# Patient Record
Sex: Male | Born: 1985 | Race: Black or African American | Hispanic: No | Marital: Single
Health system: Midwestern US, Community
[De-identification: ages and names within clinical notes are randomized; demographics above are authoritative.]

## PROBLEM LIST (undated history)

## (undated) DIAGNOSIS — G40919 Epilepsy, unspecified, intractable, without status epilepticus: Principal | ICD-10-CM

## (undated) DIAGNOSIS — G40909 Epilepsy, unspecified, not intractable, without status epilepticus: Secondary | ICD-10-CM

## (undated) DIAGNOSIS — R1013 Epigastric pain: Principal | ICD-10-CM

## (undated) DIAGNOSIS — R1902 Left upper quadrant abdominal swelling, mass and lump: Secondary | ICD-10-CM

## (undated) DIAGNOSIS — Z5900 Homelessness unspecified: Secondary | ICD-10-CM

## (undated) DIAGNOSIS — N200 Calculus of kidney: Secondary | ICD-10-CM

## (undated) DIAGNOSIS — R569 Unspecified convulsions: Secondary | ICD-10-CM

## (undated) DIAGNOSIS — J45909 Unspecified asthma, uncomplicated: Secondary | ICD-10-CM

## (undated) DIAGNOSIS — E559 Vitamin D deficiency, unspecified: Secondary | ICD-10-CM

## (undated) DIAGNOSIS — G40409 Other generalized epilepsy and epileptic syndromes, not intractable, without status epilepticus: Secondary | ICD-10-CM

## (undated) DIAGNOSIS — F419 Anxiety disorder, unspecified: Secondary | ICD-10-CM

## (undated) DIAGNOSIS — S92901A Unspecified fracture of right foot, initial encounter for closed fracture: Secondary | ICD-10-CM

## (undated) DIAGNOSIS — J342 Deviated nasal septum: Secondary | ICD-10-CM

## (undated) HISTORY — DX: Unspecified fracture of right foot, initial encounter for closed fracture: S92.901A

## (undated) HISTORY — DX: Deviated nasal septum: J34.2

## (undated) HISTORY — DX: Anxiety disorder, unspecified: F41.9

---

## 2012-08-23 ENCOUNTER — Inpatient Hospital Stay: Payer: Self-pay | Admitting: Internal Medicine

## 2012-08-23 LAB — COMPREHENSIVE METABOLIC PANEL
Alkaline Phosphatase: 52 U/L (ref 50–136)
Anion Gap: 3 — ABNORMAL LOW (ref 7–16)
BUN: 8 mg/dL (ref 7–18)
Calcium, Total: 8.9 mg/dL (ref 8.5–10.1)
Chloride: 105 mmol/L (ref 98–107)
EGFR (Non-African Amer.): >60
Glucose: 76 mg/dL (ref 65–99)
Osmolality: 275 (ref 275–301)
Potassium: 3.7 mmol/L (ref 3.5–5.1)
SGOT(AST): 23 U/L (ref 15–37)
SGPT (ALT): 20 U/L (ref 12–78)
Sodium: 139 mmol/L (ref 136–145)

## 2012-08-23 LAB — ETHANOL
Ethanol %: <0.003 % (ref 0.000–0.080)
Ethanol: <3 mg/dL

## 2012-08-23 LAB — URINALYSIS, COMPLETE
Bilirubin,UR: NEGATIVE
Glucose,UR: NEGATIVE mg/dL (ref 0–75)
Leukocyte Esterase: NEGATIVE
Nitrite: NEGATIVE
Protein: NEGATIVE
RBC,UR: 1 /HPF (ref 0–5)
Specific Gravity: 1.008 (ref 1.003–1.030)

## 2012-08-23 LAB — DRUG SCREEN, URINE
Amphetamines, Ur Screen: NEGATIVE (ref ?–1000)
Barbiturates, Ur Screen: NEGATIVE (ref ?–200)
Benzodiazepine, Ur Scrn: NEGATIVE (ref ?–200)
Cannabinoid 50 Ng, Ur ~~LOC~~: NEGATIVE (ref ?–50)
Cocaine Metabolite,Ur ~~LOC~~: NEGATIVE (ref ?–300)
MDMA (Ecstasy)Ur Screen: NEGATIVE (ref ?–500)
Opiate, Ur Screen: NEGATIVE (ref ?–300)
Phencyclidine (PCP) Ur S: NEGATIVE (ref ?–25)

## 2012-08-23 LAB — CBC
HCT: 44 % (ref 40.0–52.0)
MCH: 31 pg (ref 26.0–34.0)
MCHC: 34.7 g/dL (ref 32.0–36.0)
Platelet: 196 10*3/uL (ref 150–440)
WBC: 11.3 10*3/uL — ABNORMAL HIGH (ref 3.8–10.6)

## 2012-08-23 LAB — ACETAMINOPHEN LEVEL: Acetaminophen: <2 ug/mL

## 2012-08-23 LAB — SALICYLATE LEVEL: Salicylates, Serum: <1.7 mg/dL

## 2012-08-24 DIAGNOSIS — Z01810 Encounter for preprocedural cardiovascular examination: Secondary | ICD-10-CM

## 2012-08-24 LAB — CBC WITH DIFFERENTIAL/PLATELET
Basophil #: 0 10*3/uL (ref 0.0–0.1)
Basophil %: 0.3 %
Eosinophil %: 1.1 %
HCT: 37.4 % — ABNORMAL LOW (ref 40.0–52.0)
Lymphocyte %: 15.4 %
MCH: 31.5 pg (ref 26.0–34.0)
MCHC: 35.7 g/dL (ref 32.0–36.0)
MCV: 88 fL (ref 80–100)
Monocyte #: 0.8 x10 3/mm (ref 0.2–1.0)
Platelet: 169 10*3/uL (ref 150–440)
RDW: 12.8 % (ref 11.5–14.5)
WBC: 8.6 10*3/uL (ref 3.8–10.6)

## 2012-08-24 LAB — COMPREHENSIVE METABOLIC PANEL
Albumin: 3.2 g/dL — ABNORMAL LOW (ref 3.4–5.0)
Alkaline Phosphatase: 47 U/L — ABNORMAL LOW (ref 50–136)
Anion Gap: 4 — ABNORMAL LOW (ref 7–16)
BUN: 5 mg/dL — ABNORMAL LOW (ref 7–18)
Calcium, Total: 7.7 mg/dL — ABNORMAL LOW (ref 8.5–10.1)
Chloride: 113 mmol/L — ABNORMAL HIGH (ref 98–107)
Co2: 30 mmol/L (ref 21–32)
Creatinine: 0.97 mg/dL (ref 0.60–1.30)
EGFR (Non-African Amer.): >60
Glucose: 89 mg/dL (ref 65–99)
Osmolality: 289 (ref 275–301)
SGOT(AST): 15 U/L (ref 15–37)
SGPT (ALT): 16 U/L (ref 12–78)
Sodium: 147 mmol/L — ABNORMAL HIGH (ref 136–145)

## 2012-08-24 LAB — PROTIME-INR: Prothrombin Time: 14.4 secs (ref 11.5–14.7)

## 2012-08-25 LAB — BASIC METABOLIC PANEL
BUN: 4 mg/dL — ABNORMAL LOW (ref 7–18)
Calcium, Total: 8.5 mg/dL (ref 8.5–10.1)
Chloride: 108 mmol/L — ABNORMAL HIGH (ref 98–107)
Co2: 27 mmol/L (ref 21–32)
Creatinine: 0.7 mg/dL (ref 0.60–1.30)
EGFR (African American): >60
EGFR (Non-African Amer.): >60
Osmolality: 277 (ref 275–301)
Sodium: 140 mmol/L (ref 136–145)

## 2012-08-25 LAB — MAGNESIUM
Magnesium: 1.7 mg/dL — ABNORMAL LOW
Magnesium: 2.4 mg/dL

## 2012-09-02 ENCOUNTER — Ambulatory Visit: Payer: Self-pay | Admitting: Family Medicine

## 2013-08-15 ENCOUNTER — Encounter (HOSPITAL_COMMUNITY): Payer: Self-pay | Admitting: Emergency Medicine

## 2013-08-15 ENCOUNTER — Emergency Department (HOSPITAL_COMMUNITY)
Admission: EM | Admit: 2013-08-15 | Discharge: 2013-08-16 | Disposition: A | Discharge status: Home / Self Care | Payer: Medicaid Other | Attending: Emergency Medicine | Admitting: Emergency Medicine

## 2013-08-15 DIAGNOSIS — M25569 Pain in unspecified knee: Secondary | ICD-10-CM | POA: Insufficient documentation

## 2013-08-15 DIAGNOSIS — M545 Low back pain, unspecified: Secondary | ICD-10-CM

## 2013-08-15 DIAGNOSIS — M5430 Sciatica, unspecified side: Secondary | ICD-10-CM | POA: Insufficient documentation

## 2013-08-15 DIAGNOSIS — M79671 Pain in right foot: Secondary | ICD-10-CM

## 2013-08-15 DIAGNOSIS — M25561 Pain in right knee: Secondary | ICD-10-CM

## 2013-08-15 DIAGNOSIS — J45909 Unspecified asthma, uncomplicated: Secondary | ICD-10-CM | POA: Insufficient documentation

## 2013-08-15 DIAGNOSIS — M25579 Pain in unspecified ankle and joints of unspecified foot: Secondary | ICD-10-CM | POA: Insufficient documentation

## 2013-08-15 HISTORY — DX: Unspecified convulsions: R56.9

## 2013-08-15 HISTORY — DX: Unspecified asthma, uncomplicated: J45.909

## 2013-08-15 MED ORDER — NAPROXEN 500 MG PO TABS: 500.0000 mg | ORAL_TABLET | Freq: Two times a day (BID) | ORAL | Status: DC

## 2013-08-15 NOTE — ED Provider Notes (Signed)
CSN: [id number]     Arrival date & time 08/15/13  2157 History  This chart was scribed for non-physician practitioner working with Justin OctaveStephen Rancour, MD by Justin Logan, ED Scribe. This patient was seen in room TR09C/TR09C and the patient's care was started at 11:31 PM.   Chief Complaint  Patient presents with  . Back Pain     The history is provided by the patient. No language interpreter was used.   HPI Comments: Justin Logan is a 28 y.o. male brought in by EMS, who presents to the Emergency Department complaining of right sided back pain that radiates to the right knee and plantar surface of the right foot, ongoing for 15 minutes prior to arrival to ED. Patient reports that the pain in his knee and foot presented first with his back pain following. Pain exacerbated with ambulation and movement. Patient denies numbness and tingling in his lower extremities and bowel/bladder changes or incontinence. Patient denies recent injury or trauma. Patient denies history of similar pain. Patient denies history of IV drug use or cancer.  Past Medical History  Diagnosis Date  . Seizures   . Asthma    History reviewed. No pertinent past surgical history. No family history on file. History  Substance Use Topics  . Smoking status: Never Smoker   . Smokeless tobacco: Not on file  . Alcohol Use: No    Review of Systems  Constitutional: Negative for fever and chills.  Genitourinary: Negative for dysuria and frequency.  Musculoskeletal: Positive for arthralgias, back pain and myalgias.  Neurological: Negative for weakness and numbness.      Allergies  Review of patient's allergies indicates no known allergies.  Home Medications   Prior to Admission medications   Not on File   Triage Vitals: BP 134/69  Pulse 89  Temp(Src) 98.6 F (37 C) (Oral)  Resp 18  Ht 6\' 6"  (1.981 m)  Wt 240 lb 9 oz (109.118 kg)  BMI 27.81 kg/m2  SpO2 98% Physical Exam  Nursing note and vitals  reviewed. Constitutional: He is oriented to person, place, and time. He appears well-developed and well-nourished. No distress.  HENT:  Head: Normocephalic and atraumatic.  Eyes: EOM are normal.  Neck: Neck supple.  Cardiovascular: Normal rate.   Pulmonary/Chest: Effort normal. No respiratory distress.  Musculoskeletal: Normal range of motion. He exhibits tenderness. He exhibits no edema.  Right leg: No erythema, edema or warmth. 2+ DP pulse of right foot. No erythema, warmth or edema or right foot. Small callus on bottom of right foot.  2+ patellar reflexes bilaterally. Muscle strength 5/5 in lower extremities bilaterally.  Neurological: He is alert and oriented to person, place, and time.  Skin: Skin is warm and dry.  Psychiatric: He has a normal mood and affect. His behavior is normal.    ED Course  Procedures (including critical care time) COORDINATION OF CARE: 11:31 PM- Will prescribe anti inflammatory. Discussed treatment plan with patient at bedside and patient agreed to plan.   Labs Review Labs Reviewed - No data to display  Imaging Review No results found.   EKG Interpretation None      MDM   Final diagnoses:  None   Patient with back pain.  No neurological deficits and normal neuro exam.  Patient can walk but states is painful.  No loss of bowel or bladder control.  No concern for cauda equina.  No fever, night sweats, weight loss, h/o cancer, IVDU.  RICE protocol and pain medicine indicated and  discussed with patient.  Patient stable for discharge.  Return precautions given.   I personally performed the services described in this documentation, which was scribed in my presence. The recorded information has been reviewed and is accurate.    Justin GladHeather Paden Kuras, PA-C 08/18/13 0121

## 2013-08-15 NOTE — ED Notes (Signed)
Per EMS: Pt reports right sided back pain that radiates to right leg x 15 minutes. Pt ambulatory to triage.Denies numbness/tingling. Denies urinary symptoms/bowel incontinence. Pt in NAD.

## 2013-08-16 ENCOUNTER — Emergency Department (HOSPITAL_COMMUNITY): Payer: Medicaid Other

## 2013-08-16 ENCOUNTER — Encounter (HOSPITAL_COMMUNITY): Payer: Self-pay | Admitting: Emergency Medicine

## 2013-08-16 ENCOUNTER — Emergency Department (HOSPITAL_COMMUNITY)
Admission: EM | Admit: 2013-08-16 | Discharge: 2013-08-16 | Disposition: A | Discharge status: Home / Self Care | Payer: Medicaid Other | Attending: Emergency Medicine | Admitting: Emergency Medicine

## 2013-08-16 DIAGNOSIS — R0789 Other chest pain: Secondary | ICD-10-CM

## 2013-08-16 DIAGNOSIS — R071 Chest pain on breathing: Secondary | ICD-10-CM

## 2013-08-16 DIAGNOSIS — M25559 Pain in unspecified hip: Secondary | ICD-10-CM | POA: Insufficient documentation

## 2013-08-16 DIAGNOSIS — M25569 Pain in unspecified knee: Secondary | ICD-10-CM | POA: Insufficient documentation

## 2013-08-16 DIAGNOSIS — R079 Chest pain, unspecified: Secondary | ICD-10-CM | POA: Insufficient documentation

## 2013-08-16 DIAGNOSIS — Z791 Long term (current) use of non-steroidal anti-inflammatories (NSAID): Secondary | ICD-10-CM | POA: Insufficient documentation

## 2013-08-16 DIAGNOSIS — M25552 Pain in left hip: Secondary | ICD-10-CM

## 2013-08-16 DIAGNOSIS — Z8669 Personal history of other diseases of the nervous system and sense organs: Secondary | ICD-10-CM | POA: Insufficient documentation

## 2013-08-16 DIAGNOSIS — M25562 Pain in left knee: Secondary | ICD-10-CM

## 2013-08-16 DIAGNOSIS — J45909 Unspecified asthma, uncomplicated: Secondary | ICD-10-CM | POA: Insufficient documentation

## 2013-08-16 MED ORDER — NAPROXEN 500 MG PO TABS
500.0000 mg | ORAL_TABLET | Freq: Once | ORAL | Status: AC
  Administered 2013-08-16: 500 mg via ORAL
  Filled 2013-08-16: qty 1

## 2013-08-16 NOTE — ED Notes (Signed)
Pt ambulating independently w/ steady gait on d/c in no acute distress, A&Ox4. D/c instructions reviewed w/ pt - pt denies any further questions or concerns at present. Rx given x1  

## 2013-08-16 NOTE — ED Notes (Signed)
Patient is alert and oriented x3.  He is complaining of lower right abdominal pain that started about  An hour ago.  [facility].  He [facility] [place] the road when he states the pain started.  Currently he rates his pain 10 of 10.

## 2013-08-16 NOTE — ED Notes (Signed)
Patient is alert and oriented x3.  He was given DC instructions and follow up visit instructions.  Patient gave verbal understanding.  He was DC ambulatory under his own power to home.  V/S stable.  He was not showing any signs of distress on DC 

## 2013-08-16 NOTE — Discharge Instructions (Signed)
Fill the prescription for Naproxen give to you yesterday. Follow up with a primary care provider.  Arthralgia Your caregiver has diagnosed you as suffering from an arthralgia. Arthralgia means there is pain in a joint. This can come from many reasons including:  Bruising the joint which causes soreness (inflammation) in the joint.  Wear and tear on the joints which occur as we grow older (osteoarthritis).  Overusing the joint.  Various forms of arthritis.  Infections of the joint. Regardless of the cause of pain in your joint, most of these different pains respond to anti-inflammatory drugs and rest. The exception to this is when a joint is infected, and these cases are treated with antibiotics, if it is a bacterial infection. HOME CARE INSTRUCTIONS   Rest the injured area for as long as directed by your caregiver. Then slowly start using the joint as directed by your caregiver and as the pain allows. Crutches as directed may be useful if the ankles, knees or hips are involved. If the knee was splinted or casted, continue use and care as directed. If an stretchy or elastic wrapping bandage has been applied today, it should be removed and re-applied every 3 to 4 hours. It should not be applied tightly, but firmly enough to keep swelling down. Watch toes and feet for swelling, bluish discoloration, coldness, numbness or excessive pain. If any of these problems (symptoms) occur, remove the ace bandage and re-apply more loosely. If these symptoms persist, contact your caregiver or return to this location.  For the first 24 hours, keep the injured extremity elevated on pillows while lying down.  Apply ice for 15-20 minutes to the sore joint every couple hours while awake for the first half day. Then 03-04 times per day for the first 48 hours. Put the ice in a plastic bag and place a towel between the bag of ice and your skin.  Wear any splinting, casting, elastic bandage applications, or slings as  instructed.  Only take over-the-counter or prescription medicines for pain, discomfort, or fever as directed by your caregiver. Do not use aspirin immediately after the injury unless instructed by your physician. Aspirin can cause increased bleeding and bruising of the tissues.  If you were given crutches, continue to use them as instructed and do not resume weight bearing on the sore joint until instructed. Persistent pain and inability to use the sore joint as directed for more than 2 to 3 days are warning signs indicating that you should see a caregiver for a follow-up visit as soon as possible. Initially, a hairline fracture (break in bone) may not be evident on X-rays. Persistent pain and swelling indicate that further evaluation, non-weight bearing or use of the joint (use of crutches or slings as instructed), or further X-rays are indicated. X-rays may sometimes not show a small fracture until a week or 10 days later. Make a follow-up appointment with your own caregiver or one to whom we have referred you. A radiologist (specialist in reading X-rays) may read your X-rays. Make sure you know how you are to obtain your X-ray results. Do not assume everything is normal if you do not hear from us. SEEK MEDICAL CARE IF: Bruising, swelling, or pain increases. SEEK IMMEDIATE MEDICAL CARE IF:   Your fingers or toes are numb or blue.  The pain is not responding to medications and continues to stay the same or get worse.  The pain in your joint becomes severe.  You develop a fever over 102  F (38.9 C).  It becomes impossible to move or use the joint. MAKE SURE YOU:   Understand these instructions.  Will watch your condition.  Will get help right away if you are not doing well or get worse. Document Released: 02/12/2005 Document Revised: 05/07/2011 Document Reviewed: 10/01/2007 [facility] Patient Information 2015 [place], [place]. This information is not intended to replace advice given to you  by your health care provider. Make sure you discuss any questions you have with your health care provider.  RICE: Routine Care for Injuries The routine care of many injuries includes Rest, Ice, Compression, and Elevation (RICE). HOME CARE INSTRUCTIONS  Rest is needed to allow your body to heal. Routine activities can usually be resumed when comfortable. Injured tendons and bones can take up to 6 weeks to heal. Tendons are the cord-like structures that attach muscle to bone.  Ice following an injury helps keep the swelling down and reduces pain.  Put ice in a plastic bag.  Place a towel between your skin and the bag.  Leave the ice on for 15-20 minutes, 3-4 times a day, or as directed by your health care provider. Do this while awake, for the first 24 to 48 hours. After that, continue as directed by your caregiver.  Compression helps keep swelling down. It also gives support and helps with discomfort. If an elastic bandage has been applied, it should be removed and reapplied every 3 to 4 hours. It should not be applied tightly, but firmly enough to keep swelling down. Watch fingers or toes for swelling, bluish discoloration, coldness, numbness, or excessive pain. If any of these problems occur, remove the bandage and reapply loosely. Contact your caregiver if these problems continue.  Elevation helps reduce swelling and decreases pain. With extremities, such as the arms, hands, legs, and feet, the injured area should be placed near or above the level of the heart, if possible. SEEK IMMEDIATE MEDICAL CARE IF:  You have persistent pain and swelling.  You develop redness, numbness, or unexpected weakness.  Your symptoms are getting worse rather than improving after several days. These symptoms may indicate that further evaluation or further X-rays are needed. Sometimes, X-rays may not show a small broken bone (fracture) until 1 week or 10 days later. Make a follow-up appointment with your  caregiver. Ask when your X-ray results will be ready. Make sure you get your X-ray results. Document Released: 05/27/2000 Document Revised: 02/17/2013 Document Reviewed: 07/14/2010 [facility] Patient Information 2015 [place], [place]. This information is not intended to replace advice given to you by your health care provider. Make sure you discuss any questions you have with your health care provider.

## 2013-08-16 NOTE — ED Provider Notes (Signed)
CSN: [id number]     Arrival date & time 08/16/13  2031 History  This chart was scribed for non-physician practitioner, Antony MaduraKelly Humes, PA-C working with Dagmar HaitWilliam Blair Walden, MD by Greggory StallionKayla Andersen, ED scribe. This patient was seen in room WTR6/WTR6 and the patient's care was started at 8:54 PM.   Chief Complaint  Patient presents with  . Abdominal Pain   The history is provided by the patient. No language interpreter was used.   HPI Comments: Justin Logan is a 28 y.o. male who presents to the Emergency Department complaining of gradual onset chest pain that radiates into his right hip and knee that started about one hour ago. Denies trauma or injury to back or hip. [facility]. Denies current back pain. Bearing weight worsens the pain in R hip and knee. Patient denies fever, cough, hemoptysis, current SOB, emesis, dysuria, bowel or bladder incontinence. Denies history of cancer or IV drug use. Pt smokes black and mild cigars on occasion. Did not smoke today. Pt had a kidney stone removed 2-3 years ago but denies other past abdominal surgeries. Pt makes no mention of abdominal pain as noted in triage note.   Past Medical History  Diagnosis Date  . Seizures   . Asthma    History reviewed. No pertinent past surgical history. History reviewed. No pertinent family history. History  Substance Use Topics  . Smoking status: Never Smoker   . Smokeless tobacco: Not on file  . Alcohol Use: No    Review of Systems  Constitutional: Negative for fever.  Respiratory: Negative for cough and shortness of breath.   Cardiovascular: Positive for chest pain.  Gastrointestinal: Negative for vomiting.  Genitourinary:       Negative for bowel or bladder incontinence.  Musculoskeletal: Positive for arthralgias. Negative for back pain.  All other systems reviewed and are negative.   Allergies  Review of patient's allergies indicates no known allergies.  Home  Medications   Prior to Admission medications   Medication Sig Start Date End Date Taking? Authorizing Provider  naproxen (NAPROSYN) 500 MG tablet Take 1 tablet (500 mg total) by mouth 2 (two) times daily. 08/15/13   Heather Laisure, PA-C   BP 123/55  Pulse 83  Temp(Src) 98.5 F (36.9 C) (Oral)  Resp 18  SpO2 100%  Physical Exam  Nursing note and vitals reviewed. Constitutional: He is oriented to person, place, and time. He appears well-developed and well-nourished. No distress.  HENT:  Head: Normocephalic and atraumatic.  Eyes: Conjunctivae and EOM are normal. No scleral icterus.  Neck: Normal range of motion.  Cardiovascular: Normal rate, regular rhythm and normal heart sounds.   Pulmonary/Chest: Effort normal and breath sounds normal. No respiratory distress. He has no wheezes. He has no rales. He exhibits no tenderness.  TTP of chest wall, right of center.  Abdominal: Soft. Normal appearance. He exhibits no distension and no mass. There is tenderness in the right lower quadrant. There is no rebound, no guarding and no tenderness at McBurney's point.    Mild in RLQ without rebound or guarding. No peritoneal signs. No TTP at McBurney's point.  Musculoskeletal: Normal range of motion.       Right hip: He exhibits tenderness and bony tenderness. He exhibits normal range of motion, normal strength, no swelling, no crepitus and no deformity.       Right upper leg: Normal.  +TTP of R hip without bony deformity or crepitus.  Neurological: He  is alert and oriented to person, place, and time.  GCS 15. Speech is cold oriented. Patient moves extremities without ataxia. He ambulates with mild antalgic gait secondary to endorsed right hip pain.  Skin: Skin is warm and dry. No rash noted. He is not diaphoretic. No erythema. No pallor.  Psychiatric: He has a normal mood and affect. His behavior is normal.    ED Course  Procedures (including critical care time)  DIAGNOSTIC STUDIES: Oxygen  Saturation is 100% on RA, normal by my interpretation.    COORDINATION OF CARE: 8:59 PM-Discussed treatment plan which includes speaking with Dr. Gwendolyn GrantWalden with pt at bedside and pt agreed to plan.   Labs Review Labs Reviewed - No data to display  Imaging Review Dg Hip Complete Right  08/16/2013   CLINICAL DATA:  Sudden onset of chest pain radiating down to the right hip and knee.  EXAM: RIGHT HIP - COMPLETE 2+ VIEW  COMPARISON:  None.  FINDINGS: There is no evidence of hip fracture or dislocation. There is no evidence of arthropathy or other focal bone abnormality.  IMPRESSION: Negative.   Electronically Signed   By: Burman NievesWilliam  Stevens M.D.   On: 08/16/2013 22:14     EKG Interpretation None      MDM   Final diagnoses:  Left hip pain  Left knee pain  Costochondral chest pain    28 year old male presents to the emergency department today for variety of complaints. Patient complaining of chest pain which radiated to his right hip and anterior right knee. Patient denies any trauma or injury. Patient made reference to right abdominal pain in triage, but made no mention of abdominal pain upon my presentation. Physical exam significant for tenderness to palpation in the right lower quadrant and bony tenderness to the right hip. No peritoneal signs. Patient neurovascularly intact with no sensory deficits. He is weightbearing without assistance and ambulates with mild antalgic gait secondary to endorsed hip pain.  Imaging of hip obtained which is negative for fracture or bony deformity. Patient treated in ED with approximate with improvement in his hip and knee pain as well as his chest pain. Abdominal reexamination also improved. No fever, nausea, or vomiting to suggest appendicitis. My suspicion for this is very low given reassuring exam. Suspect MSK related pain. Will d/c with [facility] and to fill Rx for Naproxen given yesterday. Patient states he is hungry; requests sandwich  which was provided. Return precautions discussed and patient agreeable to plan with no unaddressed concerns.  I personally performed the services described in this documentation, which was scribed in my presence. The recorded information has been reviewed and is accurate.   Filed Vitals:   08/16/13 2032 08/16/13 2246 08/16/13 2302 08/16/13 2303  BP: 124/59 124/70  123/55  Pulse: 93 86 83   Temp: 98 F (36.7 C) 98.5 F (36.9 C)    TempSrc: Oral Oral    Resp: 18 18    SpO2: 100% 100% 100%      Antony MaduraKelly Humes, PA-C 08/17/13 0045

## 2013-08-17 ENCOUNTER — Encounter (HOSPITAL_COMMUNITY): Payer: Self-pay | Admitting: Emergency Medicine

## 2013-08-17 DIAGNOSIS — J45909 Unspecified asthma, uncomplicated: Secondary | ICD-10-CM | POA: Insufficient documentation

## 2013-08-17 DIAGNOSIS — Z79899 Other long term (current) drug therapy: Secondary | ICD-10-CM | POA: Insufficient documentation

## 2013-08-17 DIAGNOSIS — F17200 Nicotine dependence, unspecified, uncomplicated: Secondary | ICD-10-CM | POA: Insufficient documentation

## 2013-08-17 DIAGNOSIS — Z5900 Homelessness unspecified: Secondary | ICD-10-CM | POA: Insufficient documentation

## 2013-08-17 DIAGNOSIS — Z8669 Personal history of other diseases of the nervous system and sense organs: Secondary | ICD-10-CM | POA: Insufficient documentation

## 2013-08-17 DIAGNOSIS — G40909 Epilepsy, unspecified, not intractable, without status epilepticus: Secondary | ICD-10-CM | POA: Insufficient documentation

## 2013-08-17 DIAGNOSIS — Z765 Malingerer [conscious simulation]: Secondary | ICD-10-CM

## 2013-08-17 DIAGNOSIS — R079 Chest pain, unspecified: Secondary | ICD-10-CM | POA: Insufficient documentation

## 2013-08-17 DIAGNOSIS — Z791 Long term (current) use of non-steroidal anti-inflammatories (NSAID): Secondary | ICD-10-CM | POA: Insufficient documentation

## 2013-08-17 DIAGNOSIS — M79609 Pain in unspecified limb: Secondary | ICD-10-CM | POA: Insufficient documentation

## 2013-08-17 DIAGNOSIS — D649 Anemia, unspecified: Secondary | ICD-10-CM | POA: Insufficient documentation

## 2013-08-17 NOTE — ED Notes (Signed)
Pt presents with c/o walking all day and being hungry. That is the patient's only complaint at this time. Pt has been seen twice in the last 3 days.

## 2013-08-17 NOTE — ED Notes (Signed)
PT arrives to the ER via EMS with complaints of being hungry and feet hurting from walking all day; upon triage pt states that he is having generalized chest pain, migraine and bilateral feet pain; pt states that he has been walking all day and that's why his legs hurt

## 2013-08-18 ENCOUNTER — Emergency Department (HOSPITAL_COMMUNITY)
Admission: EM | Admit: 2013-08-18 | Discharge: 2013-08-18 | Disposition: A | Discharge status: Home / Self Care | Payer: Medicaid Other | Source: Home / Self Care | Attending: Emergency Medicine | Admitting: Emergency Medicine

## 2013-08-18 ENCOUNTER — Encounter (HOSPITAL_COMMUNITY): Payer: Self-pay | Admitting: Emergency Medicine

## 2013-08-18 ENCOUNTER — Emergency Department (HOSPITAL_COMMUNITY)
Admission: EM | Admit: 2013-08-18 | Discharge: 2013-08-19 | Disposition: A | Discharge status: Home / Self Care | Payer: Medicaid Other | Source: Home / Self Care | Attending: Emergency Medicine | Admitting: Emergency Medicine

## 2013-08-18 DIAGNOSIS — Z765 Malingerer [conscious simulation]: Secondary | ICD-10-CM

## 2013-08-18 DIAGNOSIS — N289 Disorder of kidney and ureter, unspecified: Secondary | ICD-10-CM

## 2013-08-18 DIAGNOSIS — G40909 Epilepsy, unspecified, not intractable, without status epilepticus: Secondary | ICD-10-CM | POA: Insufficient documentation

## 2013-08-18 DIAGNOSIS — R42 Dizziness and giddiness: Secondary | ICD-10-CM

## 2013-08-18 DIAGNOSIS — Z5900 Homelessness unspecified: Secondary | ICD-10-CM | POA: Insufficient documentation

## 2013-08-18 DIAGNOSIS — R0789 Other chest pain: Secondary | ICD-10-CM | POA: Insufficient documentation

## 2013-08-18 DIAGNOSIS — Z79899 Other long term (current) drug therapy: Secondary | ICD-10-CM

## 2013-08-18 DIAGNOSIS — J45909 Unspecified asthma, uncomplicated: Secondary | ICD-10-CM | POA: Insufficient documentation

## 2013-08-18 DIAGNOSIS — IMO0001 Reserved for inherently not codable concepts without codable children: Secondary | ICD-10-CM | POA: Insufficient documentation

## 2013-08-18 DIAGNOSIS — E860 Dehydration: Secondary | ICD-10-CM | POA: Insufficient documentation

## 2013-08-18 DIAGNOSIS — M791 Myalgia, unspecified site: Secondary | ICD-10-CM

## 2013-08-18 LAB — URINE MICROSCOPIC-ADD ON

## 2013-08-18 LAB — URINALYSIS, ROUTINE W REFLEX MICROSCOPIC
Glucose, UA: NEGATIVE mg/dL
HGB URINE DIPSTICK: NEGATIVE
Ketones, ur: 40 mg/dL — AB
Nitrite: NEGATIVE
PH: 5.5 (ref 5.0–8.0)
Protein, ur: NEGATIVE mg/dL
SPECIFIC GRAVITY, URINE: 1.03 (ref 1.005–1.030)
Urobilinogen, UA: 1 mg/dL (ref 0.0–1.0)

## 2013-08-18 LAB — CBC WITH DIFFERENTIAL/PLATELET
Basophils Absolute: 0 10*3/uL (ref 0.0–0.1)
Basophils Relative: 0 % (ref 0–1)
Eosinophils Absolute: 0 10*3/uL (ref 0.0–0.7)
Eosinophils Relative: 0 % (ref 0–5)
HCT: 33.2 % — ABNORMAL LOW (ref 39.0–52.0)
HEMOGLOBIN: 10.8 g/dL — AB (ref 13.0–17.0)
LYMPHS PCT: 23 % (ref 12–46)
Lymphs Abs: 1.5 10*3/uL (ref 0.7–4.0)
MCH: 24.8 pg — AB (ref 26.0–34.0)
MCHC: 32.5 g/dL (ref 30.0–36.0)
MCV: 76.1 fL — ABNORMAL LOW (ref 78.0–100.0)
MONOS PCT: 8 % (ref 3–12)
Monocytes Absolute: 0.5 10*3/uL (ref 0.1–1.0)
Neutro Abs: 4.4 10*3/uL (ref 1.7–7.7)
Neutrophils Relative %: 69 % (ref 43–77)
Platelets: 254 10*3/uL (ref 150–400)
RBC: 4.36 MIL/uL (ref 4.22–5.81)
RDW: 13.5 % (ref 11.5–15.5)
WBC: 6.4 10*3/uL (ref 4.0–10.5)

## 2013-08-18 LAB — BASIC METABOLIC PANEL
BUN: 23 mg/dL (ref 6–23)
BUN: 28 mg/dL — AB (ref 6–23)
CHLORIDE: 97 meq/L (ref 96–112)
CHLORIDE: 98 meq/L (ref 96–112)
CO2: 23 mEq/L (ref 19–32)
CO2: 23 meq/L (ref 19–32)
CREATININE: 1.6 mg/dL — AB (ref 0.50–1.35)
Calcium: 10.1 mg/dL (ref 8.4–10.5)
Calcium: 9.4 mg/dL (ref 8.4–10.5)
Creatinine, Ser: 1.55 mg/dL — ABNORMAL HIGH (ref 0.50–1.35)
GFR calc Af Amer: 68 mL/min — ABNORMAL LOW (ref >90)
GFR calc Af Amer: 70 mL/min — ABNORMAL LOW (ref >90)
GFR calc non Af Amer: 59 mL/min — ABNORMAL LOW (ref >90)
GFR calc non Af Amer: 61 mL/min — ABNORMAL LOW (ref >90)
GLUCOSE: 75 mg/dL (ref 70–99)
Glucose, Bld: 70 mg/dL (ref 70–99)
POTASSIUM: 3.9 meq/L (ref 3.7–5.3)
Potassium: 4.5 mEq/L (ref 3.7–5.3)
Sodium: 137 mEq/L (ref 137–147)
Sodium: 138 mEq/L (ref 137–147)

## 2013-08-18 LAB — RAPID URINE DRUG SCREEN, HOSP PERFORMED
AMPHETAMINES: NOT DETECTED
Barbiturates: NOT DETECTED
Benzodiazepines: POSITIVE — AB
Cocaine: NOT DETECTED
OPIATES: NOT DETECTED
TETRAHYDROCANNABINOL: NOT DETECTED

## 2013-08-18 LAB — CBC
HCT: 36.7 % — ABNORMAL LOW (ref 39.0–52.0)
Hemoglobin: 12 g/dL — ABNORMAL LOW (ref 13.0–17.0)
MCH: 25.2 pg — ABNORMAL LOW (ref 26.0–34.0)
MCHC: 32.7 g/dL (ref 30.0–36.0)
MCV: 77.1 fL — AB (ref 78.0–100.0)
Platelets: 267 10*3/uL (ref 150–400)
RBC: 4.76 MIL/uL (ref 4.22–5.81)
RDW: 13.5 % (ref 11.5–15.5)
WBC: 9.5 10*3/uL (ref 4.0–10.5)

## 2013-08-18 LAB — I-STAT TROPONIN, ED: Troponin i, poc: 0 ng/mL (ref 0.00–0.08)

## 2013-08-18 LAB — ETHANOL

## 2013-08-18 NOTE — ED Provider Notes (Signed)
CSN: [id number]     Arrival date & time 08/18/13  [id number] History   First MD Initiated Contact with Patient 08/18/13 0901     Chief Complaint  Patient presents with  . Heat Exposure     (Consider location/radiation/quality/duration/timing/severity/associated sxs/prior Treatment) The history is provided by the patient.   He presents for evaluation of dehydration, and suspected heat exhaustion, after he was walking outside, this morning. He was discharged, from the ED, at approximately 0300 hours today. He returned about 5 hours later with these complaints. He denies vomiting, cough, or shortness of breath, back pain, paresthesias, or syncope. He, states that he is taking his seizure medications, as prescribed. He takes Depakote, and another medication, for his seizures. He has had several ED visits in the last few days, for myalgias and dizziness. [place], from [place] with a friend. There are no other reported complaints. There are no other known modifying factors.   Past Medical History  Diagnosis Date  . Seizures   . Asthma    History reviewed. No pertinent past surgical history. No family history on file. History  Substance Use Topics  . Smoking status: Never Smoker   . Smokeless tobacco: Not on file  . Alcohol Use: No    Review of Systems  All other systems reviewed and are negative.     Allergies  Review of patient's allergies indicates no known allergies.  Home Medications   Prior to Admission medications   Medication Sig Start Date End Date Taking? Authorizing Provider  divalproex (DEPAKOTE) 250 MG DR tablet Take 250 mg by mouth 3 (three) times daily.   Yes Historical Provider, MD  ferrous fumarate (HEMOCYTE - 106 MG FE) 325 (106 FE) MG TABS tablet Take 1 tablet by mouth every morning.   Yes Historical Provider, MD   BP 118/76  Pulse 73  Temp(Src) 98.5 F (36.9 C) (Oral)  Resp 18  SpO2 99% Physical Exam  Nursing note and vitals  reviewed. Constitutional: He is oriented to person, place, and time. He appears well-developed and well-nourished.  HENT:  Head: Normocephalic and atraumatic.  Right Ear: External ear normal.  Left Ear: External ear normal.  Eyes: Conjunctivae and EOM are normal. Pupils are equal, round, and reactive to light.  Neck: Normal range of motion and phonation normal. Neck supple.  Cardiovascular: Normal rate, regular rhythm, normal heart sounds and intact distal pulses.   Pulmonary/Chest: Effort normal and breath sounds normal. He exhibits no bony tenderness.  Abdominal: Soft. There is no tenderness.  Musculoskeletal: Normal range of motion.  Neurological: He is alert and oriented to person, place, and time. No cranial nerve deficit or sensory deficit. He exhibits normal muscle tone. Coordination normal.  He is lucid and cooperative  Skin: Skin is warm, dry and intact.  Psychiatric: He has a normal mood and affect. His behavior is normal. Judgment and thought content normal.    ED Course  Procedures (including critical care time)  Medications - No data to display  Patient Vitals for the past 24 hrs:  BP Temp Temp src Pulse Resp SpO2  08/18/13 1253 118/76 mmHg 98.5 F (36.9 C) Oral 73 18 99 %  08/18/13 0918 - 98.2 F (36.8 C) Rectal - - -  08/18/13 0849 116/64 mmHg 98.6 F (37 C) Oral 88 18 100 %    4:45 PM Reevaluation with update and discussion. After initial assessment and treatment, an updated evaluation reveals he is tolerating oral fluids, and oral food. Findings  discussed with patient, all questions answered. WENTZ,ELLIOTT L   Labs Review Labs Reviewed  CBC WITH DIFFERENTIAL - Abnormal; Notable for the following:    Hemoglobin 10.8 (*)    HCT 33.2 (*)    MCV 76.1 (*)    MCH 24.8 (*)    All other components within normal limits  BASIC METABOLIC PANEL - Abnormal; Notable for the following:    BUN 28 (*)    Creatinine, Ser 1.55 (*)    GFR calc non Af Amer 61 (*)    GFR  calc Af Amer 70 (*)    All other components within normal limits  URINALYSIS, ROUTINE W REFLEX MICROSCOPIC - Abnormal; Notable for the following:    Bilirubin Urine MODERATE (*)    Ketones, ur 40 (*)    Leukocytes, UA SMALL (*)    All other components within normal limits  URINE RAPID DRUG SCREEN (HOSP PERFORMED) - Abnormal; Notable for the following:    Benzodiazepines POSITIVE (*)    All other components within normal limits  URINE MICROSCOPIC-ADD ON - Abnormal; Notable for the following:    Bacteria, UA FEW (*)    Casts HYALINE CASTS (*)    All other components within normal limits  ETHANOL    Imaging Review Dg Hip Complete Right  08/16/2013   CLINICAL DATA:  Sudden onset of chest pain radiating down to the right hip and knee.  EXAM: RIGHT HIP - COMPLETE 2+ VIEW  COMPARISON:  None.  FINDINGS: There is no evidence of hip fracture or dislocation. There is no evidence of arthropathy or other focal bone abnormality.  IMPRESSION: Negative.   Electronically Signed   By: Burman NievesWilliam  Stevens M.D.   On: 08/16/2013 22:14     EKG Interpretation None      MDM   Final diagnoses:  Dehydration  Renal insufficiency  Dizziness  Myalgia    Homeless patient with recurrent complaints of dizziness. Normal evaluation clinically, and  Labs are reassuring.  He has renal insufficiency without available comparison labs, before today. There is probably a element of volume depletion however, he is able to orally rehydrate self. There is no indication for further evaluation or intervention. In emergency department setting.   Nursing Notes Reviewed/ Care Coordinated Applicable Imaging Reviewed Interpretation of Laboratory Data incorporated into ED treatment  The patient appears reasonably screened and/or stabilized for discharge and I doubt any other medical condition or other [facility] requiring further screening, evaluation, or treatment in the ED at this time prior to discharge.  Plan: Home Medications-  usual; Home Treatments- rest, oral hydration; return here if the recommended treatment, does not improve the symptoms; Recommended follow up- PCP prn  Flint MelterElliott L Wentz, MD 08/18/13 1645

## 2013-08-18 NOTE — ED Notes (Signed)
Per EMS pt comes in for possible dehydration from heat exhaustion. Pt has been walking for two days and only has one bottle of water between him and his friend.  Per EMS pt states that he hasn't ate in two days.

## 2013-08-18 NOTE — Discharge Instructions (Signed)
Fill a prescription that, you got 2, days ago for Naprosyn for your chest discomfort.  Today.  He ruled out for pneumonia, and cardiac chest pain, which is negative You have been given a resource list to find a primary care physician for your chronic minor ailments    Emergency Department Resource Guide 1) Find a Doctor and Pay Out of Pocket Although you won't have to find out who is covered by your insurance plan, it is a good idea to ask around and get recommendations. You will then need to call the office and see if the doctor you have chosen will accept you as a new patient and what types of options they offer for patients who are self-pay. Some doctors offer discounts or will set up payment plans for their patients who do not have insurance, but you will need to ask so you aren't surprised when you get to your appointment.  2) Contact Your Local Health Department Not all health departments have doctors that can see patients for sick visits, but many do, so it is worth a call to see if yours does. If you don't know where your local health department is, you can check in your phone book. The CDC also has a tool to help you locate your state's health department, and many state websites also have listings of all of their local health departments.  3) Find a Walk-in Clinic If your illness is not likely to be very severe or complicated, you may want to try a walk in clinic. These are popping up all over the country in pharmacies, drugstores, and shopping centers. They're usually staffed by nurse practitioners or physician assistants that have been trained to treat common illnesses and complaints. They're usually fairly quick and inexpensive. However, if you have serious medical issues or chronic medical problems, these are probably not your best option.  No Primary Care Doctor: - Call Health Connect at  [phone] - they can help you locate a primary care doctor that  accepts your insurance, provides  certain services, etc. - Physician Referral Service- [phone]  Chronic Pain Problems: Organization         Address  Phone   Notes  [facility]  [phone]) [phone] Patients need to be referred by their primary care doctor.   Medication Assistance: Organization         Address  Phone   Notes  [facility] Medication [facility] [address] [place], [place] [address] [phone]) [phone] --Must be a resident of [place] -- Must have NO insurance coverage whatsoever (no Medicaid/ Medicare, etc.) -- The pt. MUST have a primary care doctor that directs their care regularly and follows them in the community   MedAssist  [phone]   [employer]  [phone]    Agencies that provide inexpensive medical care: Organization         Address  Phone   Notes  [facility]  [phone]   [facility]    [phone](336) [phone]   [facility] [address] [place], [place] [address] [phone]) [phone]   [facility] [place] [address], [place] [phone]) [phone]   [facility]anned Parenthood    [phone](336) [phone]   [facility]    [phone](336) [phone]   [facility]  [address], [place] Phone:  [phone], Fax:  [phone] Hours of Operation:  9 am -  6 pm, M-F.  Also accepts Medicaid/Medicare and self-pay.  [facility] [place], [address], [place] Phone: [phone], Fax: [phone]. Hours of Operation:  8:30 am - 5:30 pm, M-F.  Also accepts Medicaid and self-pay.  [facility] [place] [address], [place] Phone: [phone]   [place], [place], [place] [phone], Ext. [phone] Mondays & Thursdays: 7-9 AM.  First 15 patients are seen on a first come, first serve basis.    [place] Providers:  Organization         Address  Phone   Notes  [facility] [address], [place] [phone] Also accepts self-pay patients.  [facility] [address], [place]  [phone]   [place], [address], [place] [phone]   [facility] [address] [place] [phone]   Lucianne Lei [address], [place]   [phone] Only accepts [place] Access [place] patients after they have their name applied to their card.   Self-Pay (no insurance) in [facility]:  Organization         Address  Phone   Notes  Sickle Cell Patients, [facility] [place] [phone]   [facility] [phone]   [facility] [place]  [place], [place], [place] [phone]   [facility]/Dr. Osei-Bonsu  [address] [place] or [address], [place] [phone] Phone number for both [place] and [place] locations is the same.  [facility] [address], [place] [phone]   [facility] [address], [place] or [address] [phone]   [facility] [address] [place] [phone], phone; [phone], fax Sees patients 1st and 3rd Saturday of every month.  Must not qualify for public or private insurance (i.e. Medicaid, Medicare, [place] Health Choice, Veterans' Benefits)  Household income should be no more than 200% of the poverty level The clinic cannot treat you if you are pregnant or think you are pregnant  Sexually transmitted diseases are not treated at the clinic.    Dental Care: Organization         Address  Phone  Notes  [facility] of [facility] [phone]  Accepts children up to age 76 who are enrolled in [place] or [place]; pregnant women with a Medicaid card; and children who have applied for Medicaid or [place] Health Choice, but were declined, whose parents can pay a reduced fee at time of service.  [facility] Department of [facility]  [address], [place] [phone] Accepts children up to age 37 who are enrolled in [place] or [place]; pregnant women with a Medicaid card; and children who have applied for Medicaid or [place] Health Choice, but were declined, whose parents can pay a reduced fee at time of service.  [place] [facility] Access PROGRAM  [place] [phone] Patients are seen by appointment only. Walk-ins are not accepted. [place] will see patients 59 years of age and  older. Monday - Tuesday (8am-5pm) Most Wednesdays (8:30-5pm) $30 per visit, cash only  [facility] Adult [employer] PROGRAM  [address], Surgery Center [phone] Patients are seen by appointment only. Walk-ins are not accepted. [place] will see patients 42 years of age and older. One Wednesday Evening (Monthly: Volunteer Based).  $30 per visit, cash only  [place]  [phone] for adults; Children under age 32, call Graduate Pediatric Dentistry at [phone]. Children aged 47-14, please call [phone] to request a pediatric application.  Dental services are provided in all areas of dental care including fillings, crowns and bridges, complete and partial dentures, implants, gum treatment, root canals, and extractions. Preventive care is also provided. Treatment is provided to both adults and children. Patients are selected via a lottery and there is often a waiting list.   [facility] [address] [place]  [phone] [email or web address]   [facility] [address] [place], [place] [phone], Ext. [phone] Second  and Fourth Thursday of each month, opens at 6:30 AM; Clinic ends at 9 AM.  Patients are seen on a first-come first-served basis, and a limited number are seen during each clinic.   [facility]  [address] Hillard Danker [place], [place] [phone]   Eligibility Requirements You must have lived in [place], [place], or [place] counties for at least the last three months.   You cannot be eligible for state or federal sponsored [employer], including [employer], [place], or [employer].   You generally cannot be eligible for healthcare insurance through your employer.    How to apply: Eligibility screenings are held every Tuesday and Wednesday afternoon from 1:00 pm until 4:00 pm. You do not need an appointment for the interview!  [facility] [address], [place], [place]   [place]  [place] Department  [place]  [phone]    Behavioral Health Resources in the Community: Intensive Outpatient Programs Organization         Address  Phone  Notes  [facility] [place]. [address] [place], [place] [phone]   [facility] Outpatient [address], [place], [place]   ADS: Alcohol & Drug Svcs [address] [place], [place]   [place] [address]  [place], [place] or [phone]   Substance Abuse Resources Organization         Address  Phone  Notes  Alcohol and Drug Services  [phone]   [place]  [phone]   The [place]   Chinita Pester  [phone]   Residential & Outpatient Substance Abuse Program  [phone]   Psychological Services Organization         Address  Phone  Notes  [facility] [place]  [place]  [phone]   [place] [address] [place] [phone] or [phone]    Mobile Crisis Teams Organization         Address  Phone  Notes  [facility]rapeutic Alternatives, Mobile Crisis Care Unit  [phone]   [facility]ive Psychotherapeutic Services  [address] [place], [place]   [facility] [address], [place] [phone]    Self-Help/Support Groups Organization         Address  Phone             Notes  Mental Health Assoc. of [place] - variety of support groups  [phone]- [phone] Call for more information  Narcotics Anonymous (NA), Caring [facility] [address], [place]  2 meetings at this location   Statisticianesidential Treatment Programs Organization         Address  Phone  Notes  [facility] [address],    [place]  [phone]   [facility]  [address], [place] [phone], [place], [place] [phone]   [facility] [address], [place] [phone] Admissions: 8am-3pm M-F  [facility] [address]    [place], [place] [phone]   [facility] [address], [place], [place] [phone]   [facility] [address]  [place], [place] [phone]   [facility] - Intensive Outpatient [address], [place], [place] [phone]   [facility] ([facility]iction Recovery Care [facility] [address]  [place], [place] [phone] or [phone]   [facility]idential Treatment Services (RTS) [address] [place], [place] [phone] Accepts Medicaid  [facility] [address]  [place] [phone] Substance Abuse/Addiction Treatment   [facility] Behavioral Health Resources Organization         Address  Phone  Notes  [facility]  [phone]) [phone]   Angie FavaJulie Brannon, PhD [address], [address] [place], [place]   [phone](336) [phone] or [phone](336) [phone]   [facility]   [address] [place], [place]  [phone](336) [phone]   [facility] [address], [place], [place] [phone](336) [phone] Insurance/Medicaid/sponsorship through [facility]  Faith and Families [address]                                    [place], [place] [phone]) [phone] Therapy/tele-psych/case  [facility] [address] St[address].   [place], [place] [phone](336) [phone]    Dr. Lolly MustacheArfeen  [phone](336) [phone]   [facility]  [employer] Way [facility] 1) [address], [place] 2) [address], [place] 3)  [address], [place] [phone](336) [phone](336) [phone]  [phone](336) [phone]   [facility] Child Abuse Hotline [phone](336) [phone] or [phone](336) [phone] (After Hours)

## 2013-08-18 NOTE — ED Notes (Signed)
Bed: [id number] Expected date:  Expected time:  Means of arrival:  Comments: ems- walking a long time, dehydrated, seen in ED last night

## 2013-08-18 NOTE — ED Provider Notes (Signed)
Medical screening examination/treatment/procedure(s) were performed by non-physician practitioner and as supervising physician I was immediately available for consultation/collaboration.   EKG Interpretation None       Stephen Rancour, MD 08/18/13 0131 

## 2013-08-18 NOTE — Discharge Instructions (Signed)
You are likely dehydrated from the heat, and decreased oral intake. Try to drink an extra 2-3 L of water each day. Followup with your doctor, at home as soon as you can.     Dehydration, Adult Dehydration is when you lose more fluids from the body than you take in. Vital organs like the kidneys, brain, and heart cannot function without a proper amount of fluids and salt. Any loss of fluids from the body can cause dehydration.  CAUSES   Vomiting.  Diarrhea.  Excessive sweating.  Excessive urine output.  Fever. SYMPTOMS  Mild dehydration  Thirst.  Dry lips.  Slightly dry mouth. Moderate dehydration  Very dry mouth.  Sunken eyes.  Skin does not bounce back quickly when lightly pinched and released.  Dark urine and decreased urine production.  Decreased tear production.  Headache. Severe dehydration  Very dry mouth.  Extreme thirst.  Rapid, weak pulse (more than 100 beats per minute at rest).  Cold hands and feet.  Not able to sweat in spite of heat and temperature.  Rapid breathing.  Blue lips.  Confusion and lethargy.  Difficulty being awakened.  Minimal urine production.  No tears. DIAGNOSIS  Your caregiver will diagnose dehydration based on your symptoms and your exam. Blood and urine tests will help confirm the diagnosis. The diagnostic evaluation should also identify the cause of dehydration. TREATMENT  Treatment of mild or moderate dehydration can often be done at home by increasing the amount of fluids that you drink. It is best to drink small amounts of fluid more often. Drinking too much at one time can make vomiting worse. Refer to the home care instructions below. Severe dehydration needs to be treated at the hospital where you will probably be given intravenous (IV) fluids that contain water and electrolytes. HOME CARE INSTRUCTIONS   Ask your caregiver about specific rehydration instructions.  Drink enough fluids to keep your urine  clear or pale yellow.  Drink small amounts frequently if you have nausea and vomiting.  Eat as you normally do.  Avoid:  Foods or drinks high in sugar.  Carbonated drinks.  Juice.  Extremely hot or cold fluids.  Drinks with caffeine.  Fatty, greasy foods.  Alcohol.  Tobacco.  Overeating.  Gelatin desserts.  Wash your hands well to avoid spreading bacteria and viruses.  Only take over-the-counter or prescription medicines for pain, discomfort, or fever as directed by your caregiver.  Ask your caregiver if you should continue all prescribed and over-the-counter medicines.  Keep all follow-up appointments with your caregiver. SEEK MEDICAL CARE IF:  You have abdominal pain and it increases or stays in one area (localizes).  You have a rash, stiff neck, or severe headache.  You are irritable, sleepy, or difficult to awaken.  You are weak, dizzy, or extremely thirsty. SEEK IMMEDIATE MEDICAL CARE IF:   You are unable to keep fluids down or you get worse despite treatment.  You have frequent episodes of vomiting or diarrhea.  You have blood or green matter (bile) in your vomit.  You have blood in your stool or your stool looks black and tarry.  You have not urinated in 6 to 8 hours, or you have only urinated a small amount of very dark urine.  You have a fever.  You faint. MAKE SURE YOU:   Understand these instructions.  Will watch your condition.  Will get help right away if you are not doing well or get worse. Document Released: 02/12/2005 Document Revised: 05/07/2011  Document Reviewed: 10/02/2010 [facility] Patient Information 2015 [employer] [place]. This information is not intended to replace advice given to you by your health care provider. Make sure you discuss any questions you have with your health care provider.

## 2013-08-18 NOTE — ED Notes (Signed)
MD at bedside. 

## 2013-08-18 NOTE — ED Notes (Signed)
Per EMS report: pt from home: pt was outside and chest started hurting. Pt was seen in the ED for dehydration.  Pt denies SOB.  Pt reports increased pain upon inspiration and palpation.  Pt a/o x 4. NAD noted. Pt ambulatory.

## 2013-08-18 NOTE — ED Provider Notes (Signed)
CSN: [id number]     Arrival date & time 08/17/13  2312 History   First MD Initiated Contact with Patient 08/18/13 [phone]     Chief Complaint  Patient presents with  . Hungry    . Chest Pain     (Consider location/radiation/quality/duration/timing/severity/associated sxs/prior Treatment) HPI Comments: Patient is homeless.  This is the third visit in 4, days for different, myalgias.  Symptoms.  He has been given prescriptions for, Naprosyn.  He's been evaluated for chest pain, pneumonia, cardiac pain, infection, UTIs.  All with negative results.  Patient is a 28 y.o. male presenting with chest pain. The history is provided by the patient.  Chest Pain Pain location:  L chest Pain quality: dull   Pain radiates to:  Does not radiate Pain radiates to the back: no   Pain severity:  Mild Timing:  Unable to specify Progression:  Unable to specify Chronicity:  Recurrent Context: not breathing, not eating and no intercourse   Relieved by:  None tried Worsened by:  Nothing tried Associated symptoms: no cough, no dizziness, no fever, no numbness, no shortness of breath and no weakness     Past Medical History  Diagnosis Date  . Seizures   . Asthma    History reviewed. No pertinent past surgical history. No family history on file. History  Substance Use Topics  . Smoking status: Never Smoker   . Smokeless tobacco: Not on file  . Alcohol Use: No    Review of Systems  Constitutional: Negative for fever.  Respiratory: Negative for cough and shortness of breath.   Cardiovascular: Positive for chest pain.  Genitourinary: Negative for dysuria.  Skin: Negative for wound.  Neurological: Negative for dizziness, weakness and numbness.  All other systems reviewed and are negative.     Allergies  Review of patient's allergies indicates no known allergies.  Home Medications   Prior to Admission medications   Medication Sig Start Date End Date Taking? Authorizing Provider  naproxen  (NAPROSYN) 500 MG tablet Take 1 tablet (500 mg total) by mouth 2 (two) times daily. 08/15/13   Heather Laisure, PA-C   BP 132/69  Pulse 88  Temp(Src) 98.6 F (37 C) (Oral)  Resp 20  SpO2 98% Physical Exam  Constitutional: Vital signs are normal. He appears well-developed. He does not have a sickly appearance. He does not appear ill.  Vision is dirty, and unkempt.  Extreme body odor  HENT:  Head: Normocephalic.  Eyes: Pupils are equal, round, and reactive to light.  Neck: Normal range of motion.  Cardiovascular: Normal rate and regular rhythm.   Pulmonary/Chest: Effort normal.  Musculoskeletal: Normal range of motion.  Neurological: He is alert.    ED Course  Procedures (including critical care time) Labs Review Labs Reviewed  CBC - Abnormal; Notable for the following:    Hemoglobin 12.0 (*)    HCT 36.7 (*)    MCV 77.1 (*)    MCH 25.2 (*)    All other components within normal limits  BASIC METABOLIC PANEL - Abnormal; Notable for the following:    Creatinine, Ser 1.60 (*)    GFR calc non Af Amer 59 (*)    GFR calc Af Amer 68 (*)    All other components within normal limits  I-STAT TROPOININ, ED    Imaging Review Dg Hip Complete Right  08/16/2013   CLINICAL DATA:  Sudden onset of chest pain radiating down to the right hip and knee.  EXAM: RIGHT HIP - COMPLETE  2+ VIEW  COMPARISON:  None.  FINDINGS: There is no evidence of hip fracture or dislocation. There is no evidence of arthropathy or other focal bone abnormality.  IMPRESSION: Negative.   Electronically Signed   By: Burman NievesWilliam  Stevens M.D.   On: 08/16/2013 22:14     EKG Interpretation None      MDM  Patient evaluation, status.  Negative for cardiac disease, EKG, and chest x-ray, are normal, as well as a negative troponin Final diagnoses:  Malingering         Arman FilterGail K Schulz, NP 08/18/13 [phone]

## 2013-08-18 NOTE — ED Provider Notes (Signed)
Medical screening examination/treatment/procedure(s) were performed by non-physician practitioner and as supervising physician I was immediately available for consultation/collaboration.   EKG Interpretation None        Dagmar HaitWilliam Blair Walden, MD 08/18/13 [phone]

## 2013-08-18 NOTE — ED Notes (Signed)
Pt reminded of need for urine specimen.  Urinal remains at bedside.

## 2013-08-18 NOTE — ED Provider Notes (Signed)
Medical screening examination/treatment/procedure(s) were performed by non-physician practitioner and as supervising physician I was immediately available for consultation/collaboration.   EKG Interpretation None       April K Palumbo-Rasch, MD 08/18/13 [phone]

## 2013-08-19 ENCOUNTER — Emergency Department (HOSPITAL_COMMUNITY)
Admission: EM | Admit: 2013-08-19 | Discharge: 2013-08-19 | Disposition: A | Discharge status: Home / Self Care | Payer: Medicaid Other | Attending: Emergency Medicine | Admitting: Emergency Medicine

## 2013-08-19 ENCOUNTER — Encounter (HOSPITAL_COMMUNITY): Payer: Self-pay | Admitting: Emergency Medicine

## 2013-08-19 ENCOUNTER — Other Ambulatory Visit: Payer: Self-pay

## 2013-08-19 ENCOUNTER — Emergency Department (HOSPITAL_COMMUNITY): Payer: Medicaid Other

## 2013-08-19 DIAGNOSIS — R079 Chest pain, unspecified: Secondary | ICD-10-CM

## 2013-08-19 LAB — CBC
HCT: 32.7 % — ABNORMAL LOW (ref 39.0–52.0)
HEMOGLOBIN: 10.7 g/dL — AB (ref 13.0–17.0)
MCH: 25 pg — ABNORMAL LOW (ref 26.0–34.0)
MCHC: 32.7 g/dL (ref 30.0–36.0)
MCV: 76.4 fL — ABNORMAL LOW (ref 78.0–100.0)
Platelets: 253 10*3/uL (ref 150–400)
RBC: 4.28 MIL/uL (ref 4.22–5.81)
RDW: 13.3 % (ref 11.5–15.5)
WBC: 7.4 10*3/uL (ref 4.0–10.5)

## 2013-08-19 LAB — BASIC METABOLIC PANEL
BUN: 25 mg/dL — ABNORMAL HIGH (ref 6–23)
CHLORIDE: 98 meq/L (ref 96–112)
CO2: 24 mEq/L (ref 19–32)
Calcium: 9.6 mg/dL (ref 8.4–10.5)
Creatinine, Ser: 1.46 mg/dL — ABNORMAL HIGH (ref 0.50–1.35)
GFR calc non Af Amer: 65 mL/min — ABNORMAL LOW (ref >90)
GFR, EST AFRICAN AMERICAN: 76 mL/min — AB (ref >90)
GLUCOSE: 89 mg/dL (ref 70–99)
POTASSIUM: 4 meq/L (ref 3.7–5.3)
Sodium: 138 mEq/L (ref 137–147)

## 2013-08-19 LAB — I-STAT TROPONIN, ED: TROPONIN I, POC: 0 ng/mL (ref 0.00–0.08)

## 2013-08-19 LAB — D-DIMER, QUANTITATIVE: D-Dimer, Quant: <0.27 ug/mL-FEU (ref 0.00–0.48)

## 2013-08-19 MED ORDER — ACETAMINOPHEN 325 MG PO TABS: 650.0000 mg | ORAL_TABLET | Freq: Four times a day (QID) | ORAL | Status: DC | PRN

## 2013-08-19 MED ORDER — IBUPROFEN 600 MG PO TABS: 600.0000 mg | ORAL_TABLET | Freq: Four times a day (QID) | ORAL | Status: DC | PRN

## 2013-08-19 NOTE — Discharge Instructions (Signed)
Your caregiver has diagnosed you as having chest pain that is not specific for one problem, but does not require admission.  You are at low risk for an acute heart condition or other serious illness. Chest pain comes from many different causes.  SEEK IMMEDIATE MEDICAL ATTENTION IF: You have severe chest pain, especially if the pain is crushing or pressure-like and spreads to the arms, back, neck, or jaw, or if you have sweating, nausea (feeling sick to your stomach), or shortness of breath. THIS IS AN EMERGENCY. Don't wait to see if the pain will go away. Get medical help at once. Call 911 or 0 (operator). DO NOT drive yourself to the hospital.  Your chest pain gets worse and does not go away with rest.  You have an attack of chest pain lasting longer than usual, despite rest and treatment with the medications your caregiver has prescribed.  You wake from sleep with chest pain or shortness of breath.  You feel dizzy or faint.  You have chest pain not typical of your usual pain for which you originally saw your caregiver.   Emergency Department Resource Guide 1) Find a Doctor and Pay Out of Pocket Although you won't have to find out who is covered by your insurance plan, it is a good idea to ask around and get recommendations. You will then need to call the office and see if the doctor you have chosen will accept you as a new patient and what types of options they offer for patients who are self-pay. Some doctors offer discounts or will set up payment plans for their patients who do not have insurance, but you will need to ask so you aren't surprised when you get to your appointment.  2) Contact Your Local Health Department Not all health departments have doctors that can see patients for sick visits, but many do, so it is worth a call to see if yours does. If you don't know where your local health department is, you can check in your phone book. The CDC also has a tool to help you locate your state's  health department, and many state websites also have listings of all of their local health departments.  3) Find a Walk-in Clinic If your illness is not likely to be very severe or complicated, you may want to try a walk in clinic. These are popping up all over the country in pharmacies, drugstores, and shopping centers. They're usually staffed by nurse practitioners or physician assistants that have been trained to treat common illnesses and complaints. They're usually fairly quick and inexpensive. However, if you have serious medical issues or chronic medical problems, these are probably not your best option.  No Primary Care Doctor: - Call Health Connect at  [phone] - they can help you locate a primary care doctor that  accepts your insurance, provides certain services, etc. - Physician Referral Service- [phone]  Chronic Pain Problems: Organization         Address  Phone   Notes  [facility]  [phone] Patients need to be referred by their primary care doctor.   Medication Assistance: Organization         Address  Phone   Notes  [facility] [address] [place], [place] [phone] --Must be a resident of [place] -- Must have NO insurance coverage whatsoever (no Medicaid/ Medicare, etc.) -- The pt. MUST have a primary care doctor that  directs their care regularly and follows them in the community   MedAssist  [phone]   [employer]  [phone]    Agencies that provide inexpensive medical care: Organization         Address  Phone   Notes  [place]  [phone]   [facility]    [phone]   [facility] [place], [place] [address] [phone]   [place] [address], [place] [phone]   Planned Parenthood    [phone]   [facility]    [phone]    [address], [place] Phone:  [phone], Fax:  [phone] Hours of Operation:  9 am - 6 pm, M-F.  Also accepts Medicaid/Medicare and self-pay.  [facility] for [place] [address], [address], [place] Phone: [phone], Fax: [phone]. Hours of Operation:  8:30 am - 5:30 pm, M-F.  Also accepts Medicaid and self-pay.  [facility] [place] [address], [place] Phone: [phone]   [place], [place], [place] [phone], Ext. [phone] Mondays & Thursdays: 7-9 AM.  First 15 patients are seen on a first come, first serve basis.    [place] Providers:  Organization         Address  Phone   Notes  [facility] [address], [place] [phone] Also accepts self-pay patients.  [facility] [place] [address], [place]  [phone]   [place], [address], [place] [phone]   [facility] [address] [place] [phone]   Lucianne Lei [address], [place]   [phone] Only accepts [place] Access [place] patients after they have their name applied to their card.   Self-Pay (no insurance) in [facility]:  Organization         Address  Phone   Notes  Sickle Cell Patients, [facility] [place] [phone]   [facility] Urgent Care [place] [phone]   [facility] [place]  [place], [place], [place] [phone]   [facility]/Dr. Osei-Bonsu  [address], [place] [phone] Phone number for both [place] and [place] locations is the same.  Urgent Medical and [facility] [address], [place] [phone]     [facility] [address], [place] or [address] [phone]   [facility] [address], [place] [phone], phone; [phone], fax Sees patients 1st and 3rd Saturday of every month.  Must not qualify for public or private insurance (i.e. Medicaid, Medicare, [place] Health Choice, Veterans' Benefits)  Household income should be no more than 200% of the poverty level The clinic cannot treat you if you are pregnant or think you are pregnant  Sexually transmitted diseases are not treated at the clinic.    Dental Care: Organization         Address  Phone  Notes  [facility] [address] [place], [place] [phone] Accepts children  up to age 23 who are enrolled in Medicaid or [place] Health Choice; pregnant women with a Medicaid card; and children who have applied for Medicaid or [place] Health Choice, but were declined, whose parents can pay a reduced fee at time of service.  [facility] Department of [facility] [place]  [address], [place] [phone] Accepts children up to age 90 who are enrolled in [place] or [place]; pregnant women with a Medicaid card; and children who have applied for Medicaid or  Health Choice, but were declined, whose parents can pay a reduced fee at time of service.  [place] Adult Dental Access PROGRAM  [place] [phone] Patients are seen by appointment only. Walk-ins are not accepted. [facility] will see patients 75 years of age and older. Monday - Tuesday (8am-5pm) Most Wednesdays (8:30-5pm) $30 per visit, cash only  [facility] Adult Dental Access PROGRAM  [address], [facility] [phone] Patients are seen by appointment only. Walk-ins are not accepted. [place] will see patients 44 years of age and older. One Wednesday Evening (Monthly: Volunteer Based).   $30 per visit, cash only  [place]  [phone] for adults; Children under age 62, call Graduate Pediatric Dentistry at [phone]. Children aged 84-14, please call [phone] to request a pediatric application.  Dental services are provided in all areas of dental care including fillings, crowns and bridges, complete and partial dentures, implants, gum treatment, root canals, and extractions. Preventive care is also provided. Treatment is provided to both adults and children. Patients are selected via a lottery and there is often a waiting list.   [facility] [address] [place]  [phone] [email or web address]   [facility] [address] [place], [place] [phone], Ext. [phone] Second and Fourth Thursday of each month, opens at 6:30 AM; Clinic ends at 9 AM.  Patients are seen on a first-come first-served basis, and a limited number are seen during each clinic.   [facility]  [address] Hillard Danker [place], [place] [phone]   Eligibility Requirements You must have lived in [place], [place], or [place] counties for at least the last three months.   You cannot be eligible for state or federal sponsored [employer], including [employer], [place], or [employer].   You generally cannot be eligible for healthcare insurance through your employer.    How to apply: Eligibility screenings are held every Tuesday and Wednesday afternoon from 1:00 pm until 4:00 pm. You do not need an appointment for the interview!  [facility] [address] [place], [place]   [place]  [place] Department  [place]  [phone]    Behavioral Health Resources in the Community: Intensive Outpatient Programs Organization         Address  Phone  Notes  [place] [facility]. [address] [place], [place] [phone]   [facility] Outpatient [address], [place], [place]. Garcia   ADS: Alcohol & Drug Svcs [address], [place], [place]   [place] [address],  [place], [place] or [phone]   Substance Abuse Resources Organization         Address  Phone  Notes  Alcohol and Drug Services  [phone]   Addiction  [facility]  [phone]   [facility] [place]  [phone]   Chinita Pester  [phone]   Residential & Outpatient Substance Abuse Program  [phone]   Psychological Services Organization         Address  Phone  Notes  [facility] [place]  [place]  [phone]   [place] [address] [place] or [phone]    Mobile Crisis Teams Organization         Address  Phone  Notes  [facility]rapeutic Alternatives, Mobile Crisis Care Unit  [phone]   [facility]  [address] [place], [place]   [place] Levels [address] [place] [phone]    Self-Help/Support Groups Organization         Address  Phone             Notes  [place]a. of [place] - variety of support groups  [place] Call for more information  Narcotics Anonymous (NA), [facility] [address], [employer] [place]  2 meetings at this location   Special educational needs teacher         Address  Phone  Notes  ASAP Residential Treatment [place],    [place]  [phone]   [facility]  [address], [place] [address], [place], [place]   [place], [place] [phone] Admissions: 8am-3pm M-F  Incentives Substance [place] [address],    [place], [place] [phone]   [facility] [address] [place], [place], [place]   [facility] [address]  [place], [place]   [facility] Programs - Intensive Outpatient [address] Kristeen [address], [place], [place]   [facility] ([place] [address]  [place], [place] [phone] or [phone]   [facility]idential Treatment Services (RTS) [address], [place], [place] Accepts Medicaid  [facility] [place] [address]  [place] [phone] Substance Abuse/Addiction Treatment   [facility] Organization         Address  Phone  Notes  [facility]  [phone]   Domenic Schwab, PhD [address] Arlis Porta [place], [place]   [phone] or [phone]   [facility] [place], [place] [phone]   [facility] [address], [place], [place] [phone] Insurance/Medicaid/sponsorship through [facility] and Families [address] [place]                                    [place], [place] [phone] [place] [address], [place] [phone]    Dr. Adele Schilder  [phone]   [facility] Dept. 1) [address], [place] 2) [place] 3)  [place] [address], [place] [phone]  [phone]   [place] [phone] or [phone] (After Hours)

## 2013-08-19 NOTE — ED Provider Notes (Signed)
Medical screening examination/treatment/procedure(s) were performed by non-physician practitioner and as supervising physician I was immediately available for consultation/collaboration.   EKG Interpretation   Date/Time:  Wednesday August 19 2013 00:03:56 EDT Ventricular Rate:  95 PR Interval:  127 QRS Duration: 78 QT Interval:  346 QTC Calculation: 435 R Axis:   53 Text Interpretation:  Sinus rhythm ST elevation suggests acute  pericarditis Baseline wander in lead(s) V1 V2 V3 V4 V5 V6 slight change in  ST elevation from prior Reconfirmed by OTTER  MD, OLGA ([id number]) on  08/19/2013 1:32:10 AM       Olivia Mackielga M Otter, MD 08/19/13 [id number]

## 2013-08-19 NOTE — ED Provider Notes (Signed)
CSN: [id number]     Arrival date & time 08/19/13  1752 History   First MD Initiated Contact with Patient 08/19/13 1753     Chief Complaint  Patient presents with  . Chest Pain     (Consider location/radiation/quality/duration/timing/severity/associated sxs/prior Treatment) Patient is a 28 y.o. male presenting with chest pain.  Chest Pain Associated symptoms: no abdominal pain, no fever and no shortness of breath     28 year old male with multiple previous visits to the emergency room over the last several days complaining of chest pain. Patient reports that his chest pain has not ever resolved. He has been walking over the last 2 weeks. He started out walking in [place], [place], and is headed towards [place]. He is accompanied by a friend, with whom he walks. He continues to complain of pain in the center of his chest. The pain is worse with palpation. There are no alleviating factors. He has been carrying 3 heavy bags with him while walking. It does not radiate anywhere. He has not had any fever or shortness of breath, also denies any cough, leg swelling, leg redness. He does endorse pain in his left lower leg, around the calf. He denies abdominal pain, problems with urination, problems with stooling.    Past Medical History  Diagnosis Date  . Seizures   . Asthma    No past surgical history on file. No family history on file. History  Substance Use Topics  . Smoking status: Current Every Day Smoker -- 1.00 packs/day for 3 years    Types: Cigarettes  . Smokeless tobacco: Not on file  . Alcohol Use: No     Comment: occasionally     Review of Systems  Constitutional: Negative for fever.  Respiratory: Negative for shortness of breath.   Cardiovascular: Positive for chest pain. Negative for leg swelling.  Gastrointestinal: Negative for abdominal pain and constipation.  Genitourinary: Negative for difficulty urinating.  All other systems reviewed and are  negative.     Allergies  Review of patient's allergies indicates no known allergies.  Home Medications   Prior to Admission medications   Medication Sig Start Date End Date Taking? Authorizing Provider  divalproex (DEPAKOTE) 250 MG DR tablet Take 250 mg by mouth 3 (three) times daily.   Yes Historical Provider, MD  ferrous fumarate (HEMOCYTE - 106 MG FE) 325 (106 FE) MG TABS tablet Take 1 tablet by mouth every morning.   Yes Historical Provider, MD  ibuprofen (ADVIL,MOTRIN) 600 MG tablet Take 1 tablet (600 mg total) by mouth every 6 (six) hours as needed. 08/19/13   Antony MaduraKelly Humes, PA-C   BP 125/67  Pulse 97  Temp(Src) 98.3 F (36.8 C) (Oral)  Resp 12  Ht 6\' 6"  (1.981 m)  Wt 240 lb (108.863 kg)  BMI 27.74 kg/m2  SpO2 100% Physical Exam  Constitutional: He appears well-developed and well-nourished. No distress.  malodorous  HENT:  Head: Normocephalic and atraumatic.  Mouth/Throat: Oropharynx is clear and moist.  Eyes: Pupils are equal, round, and reactive to light.  Cardiovascular: Normal rate, regular rhythm, normal heart sounds and intact distal pulses.   No murmur heard. Pulmonary/Chest: Effort normal and breath sounds normal. No respiratory distress. He has no wheezes. He has no rales.  Abdominal: Soft. Bowel sounds are normal. He exhibits no distension and no mass. There is no tenderness. There is no rebound and no guarding.  Musculoskeletal: He exhibits tenderness. He exhibits no edema.  Left calf tender to palpation. No swelling,  erythema, or warmth. No palpable cords. Right lower extremity is similarly tender.  Neurological: He is alert.  Skin: Skin is warm and dry. He is not diaphoretic. No erythema.    ED Course  Procedures (including critical care time) Labs Review Labs Reviewed  CBC - Abnormal; Notable for the following:    Hemoglobin 10.7 (*)    HCT 32.7 (*)    MCV 76.4 (*)    MCH 25.0 (*)    All other components within normal limits  BASIC METABOLIC PANEL  - Abnormal; Notable for the following:    BUN 25 (*)    Creatinine, Ser 1.46 (*)    GFR calc non Af Amer 65 (*)    GFR calc Af Amer 76 (*)    All other components within normal limits  D-DIMER, QUANTITATIVE  I-STAT TROPOININ, ED    Imaging Review Dg Chest 2 View  08/19/2013   CLINICAL DATA:  Chest pain and shortness of Breath.  EXAM: CHEST  2 VIEW  COMPARISON:  None.  FINDINGS: The cardiac silhouette, mediastinal and hilar contours are within normal limits the lungs are clear. No pleural effusion. No pneumothorax. The bony thorax is intact.  IMPRESSION: Normal chest x-ray.   Electronically Signed   By: Loralie ChampagneMark  Gallerani M.D.   On: 08/19/2013 19:08     EKG Interpretation   Date/Time:  Wednesday August 19 2013 18:05:50 EDT Ventricular Rate:  92 PR Interval:  194 QRS Duration: 81 QT Interval:  365 QTC Calculation: 451 R Axis:   46 Text Interpretation:  Sinus rhythm Lateral infarct, acute Borderline ST  elevation, anterior leads Baseline wander in lead(s) I compared to old EKG  suggests early repolarization Confirmed by ZACKOWSKI  MD, SCOTT [phone]) on  08/19/2013 8:02:14 PM      MDM   Final diagnoses:  Chest pain, unspecified chest pain type   28 year old male presenting again with continuous chest pain. Suspect chest pain is musculoskeletal in nature, as he's been carrying heavy bags with him. His troponin is negative, and d-dimer is also negative. He complains of leg pain, however has no swelling or erythema to suggest cellulitis or DVT. A d-dimer was checked and is negative, effectively ruling out PE and DVT. His EKG shows some possible ST elevation, however after reviewing this with Dr. Deretha EmoryZackowski, this appears more consistent with early repolarization. His symptoms are not consistent with pericarditis, as it is not worse with lying back. We would expect his troponin to be positive at this point, after having multiple days worth of chest pain. His leg pain is most likely from having  walked consistently outside for the last 2 weeks. He does have chronic anemia, and it also appears he has chronic renal insufficiency, however his labs are improved from when checked one day ago.   Patient is stable for discharge, and will be given a prescription for Tylenol to take as needed for pain.  Levert FeinsteinBrittany McIntyre, MD Family Medicine PGY-2   Latrelle DodrillBrittany J McIntyre, MD 08/19/13 2033

## 2013-08-19 NOTE — ED Provider Notes (Signed)
CSN: [id number]     Arrival date & time 08/18/13  2359 History   First MD Initiated Contact with Patient 08/19/13 0128     Chief Complaint  Patient presents with  . Chest Pain    (Consider location/radiation/quality/duration/timing/severity/associated sxs/prior Treatment) HPI Comments: Patient is a 28 year old male with no significant past medical history who presents to the emergency department today for chest pain. Patient states the chest pain began at approximately 2200 this evening and has been constant since onset. Pain is located in his sternal region and is sharp in nature. Patient denies any radiation of his pain and denies taking anything for symptoms. Patient cannot verbalize any modifying factors of his chest pain. Patient denies associated fever, cough or hemoptysis, shortness of breath, vomiting, numbness/tingling, and extremity weakness. Patient denies a family history of ACS.  Patient is a 28 y.o. male presenting with chest pain. The history is provided by the patient. No language interpreter was used.  Chest Pain   Past Medical History  Diagnosis Date  . Seizures   . Asthma    History reviewed. No pertinent past surgical history. No family history on file. History  Substance Use Topics  . Smoking status: Never Smoker   . Smokeless tobacco: Not on file  . Alcohol Use: No    Review of Systems  Cardiovascular: Positive for chest pain.  All other systems reviewed and are negative.    Allergies  Review of patient's allergies indicates no known allergies.  Home Medications   Prior to Admission medications   Medication Sig Start Date End Date Taking? Authorizing Provider  divalproex (DEPAKOTE) 250 MG DR tablet Take 250 mg by mouth 3 (three) times daily.   Yes Historical Provider, MD  ferrous fumarate (HEMOCYTE - 106 MG FE) 325 (106 FE) MG TABS tablet Take 1 tablet by mouth every morning.   Yes Historical Provider, MD  ibuprofen (ADVIL,MOTRIN) 600 MG tablet Take 1  tablet (600 mg total) by mouth every 6 (six) hours as needed. 08/19/13   Antony MaduraKelly Humes, PA-C   BP 127/66  Pulse 76  Temp(Src) 98.3 F (36.8 C) (Oral)  Resp 16  SpO2 98%  Physical Exam  Nursing note and vitals reviewed. Constitutional: He is oriented to person, place, and time. He appears well-developed and well-nourished. No distress.  Nontoxic/nonseptic appearing. Patient in no visible or audible discomfort.  HENT:  Head: Normocephalic and atraumatic.  Eyes: Conjunctivae and EOM are normal. No scleral icterus.  Neck: Normal range of motion.  Cardiovascular: Normal rate, regular rhythm, normal heart sounds and intact distal pulses.   Pulmonary/Chest: Effort normal and breath sounds normal. No respiratory distress. He has no wheezes. He has no rales. He exhibits tenderness. He exhibits no crepitus, no deformity and no retraction.    Abdominal: Soft. He exhibits no distension. There is no tenderness.  Musculoskeletal: Normal range of motion.  Neurological: He is alert and oriented to person, place, and time.  GCS 15. Patient moves his extremities vigorously.  Skin: Skin is warm and dry. No rash noted. He is not diaphoretic. No erythema. No pallor.  Psychiatric: He has a normal mood and affect. His behavior is normal.    ED Course  Procedures (including critical care time) Labs Review Labs Reviewed - No data to display  Imaging Review No results found.   EKG Interpretation   Date/Time:  Wednesday August 19 2013 00:03:56 EDT Ventricular Rate:  95 PR Interval:  127 QRS Duration: 78 QT Interval:  346 QTC  Calculation: 435 R Axis:   53 Text Interpretation:  Sinus rhythm ST elevation suggests acute  pericarditis Baseline wander in lead(s) V1 V2 V3 V4 V5 V6 slight change in  ST elevation from prior Reconfirmed by OTTER  MD, OLGA ([id number]) on  08/19/2013 1:32:10 AM      MDM   Final diagnoses:  Atypical chest pain    Patient presents to the emergency department today for the  third time in the last 24 hours. Patient presents for chest pain. He denies taking any medications recommended to him on prior discharges. Patient is well and nontoxic appearing, hemodynamically stable, and afebrile. EKG today shows ST elevation suggestive of early repolarization versus pericarditis. Doubt ACS as symptoms atypical for this and patient has no risk factors for MI. His troponin yesterday was 0.00. No family history of ACS. Doubt dissection. Also doubt pulmonary embolism given lack of tachycardia, tachypnea, dyspnea, or hypoxia.   Given chest tenderness, suspect musculoskeletal pain. Given findings of potential pericarditis in EKG today, will prescribe ibuprofen 600 mg 4 times a day. Have given referral to wellness [facility] for followup. Patient discharged in good condition.  Filed Vitals:   08/19/13 0005 08/19/13 0259  BP: 127/70 127/66  Pulse: 93 76  Temp: 98.3 F (36.8 C)   TempSrc: Oral   Resp: 16 16  SpO2: 99% 98%       Antony MaduraKelly Humes, PA-C 08/19/13 [phone]

## 2013-08-19 NOTE — ED Notes (Signed)
Contacted social work.  [facility] contacted- no beds available.

## 2013-08-19 NOTE — ED Notes (Signed)
Provided pt with homeless shelter list.  No beds available at [facility] this pm.

## 2013-08-19 NOTE — ED Provider Notes (Signed)
I saw and evaluated the patient, reviewed the resident's note and I agree with the findings and plan.   EKG Interpretation   Date/Time:  Wednesday August 19 2013 18:05:50 EDT Ventricular Rate:  92 PR Interval:  194 QRS Duration: 81 QT Interval:  365 QTC Calculation: 451 R Axis:   46 Text Interpretation:  Sinus rhythm Lateral infarct, acute Borderline ST  elevation, anterior leads Baseline wander in lead(s) I compared to old EKG  suggests early repolarization Confirmed by Christinamarie Tall  MD, Seham Gardenhire [phone]([id number]) on  08/19/2013 8:02:14 PM      Results for orders placed during the hospital encounter of 08/19/13  D-DIMER, QUANTITATIVE      Result Value Ref Range   D-Dimer, Quant <0.27  0.00 - 0.48 ug/mL-FEU  CBC      Result Value Ref Range   WBC 7.4  4.0 - 10.5 K/uL   RBC 4.28  4.22 - 5.81 MIL/uL   Hemoglobin 10.7 (*) 13.0 - 17.0 g/dL   HCT 60.432.7 (*) 54.039.0 - 98.152.0 %   MCV 76.4 (*) 78.0 - 100.0 fL   MCH 25.0 (*) 26.0 - 34.0 pg   MCHC 32.7  30.0 - 36.0 g/dL   RDW 19.113.3  47.811.5 - 29.515.5 %   Platelets 253  150 - 400 K/uL  BASIC METABOLIC PANEL      Result Value Ref Range   Sodium 138  137 - 147 mEq/L   Potassium 4.0  3.7 - 5.3 mEq/L   Chloride 98  96 - 112 mEq/L   CO2 24  19 - 32 mEq/L   Glucose, Bld 89  70 - 99 mg/dL   BUN 25 (*) 6 - 23 mg/dL   Creatinine, Ser 6.211.46 (*) 0.50 - 1.35 mg/dL   Calcium 9.6  8.4 - 30.810.5 mg/dL   GFR calc non Af Amer 65 (*) >90 mL/min   GFR calc Af Amer 76 (*) >90 mL/min  I-STAT TROPOININ, ED      Result Value Ref Range   Troponin i, poc 0.00  0.00 - 0.08 ng/mL   Comment 3            Dg Chest 2 View  08/19/2013   CLINICAL DATA:  Chest pain and shortness of Breath.  EXAM: CHEST  2 VIEW  COMPARISON:  None.  FINDINGS: The cardiac silhouette, mediastinal and hilar contours are within normal limits the lungs are clear. No pleural effusion. No pneumothorax. The bony thorax is intact.  IMPRESSION: Normal chest x-ray.   Electronically Signed   By: Loralie ChampagneMark  Gallerani M.D.   On:  08/19/2013 19:08   Dg Hip Complete Right  08/16/2013   CLINICAL DATA:  Sudden onset of chest pain radiating down to the right hip and knee.  EXAM: RIGHT HIP - COMPLETE 2+ VIEW  COMPARISON:  None.  FINDINGS: There is no evidence of hip fracture or dislocation. There is no evidence of arthropathy or other focal bone abnormality.  IMPRESSION: Negative.   Electronically Signed   By: Burman NievesWilliam  Stevens M.D.   On: 08/16/2013 22:14    Patient with complaint of some midsternal chest pain 8/10 for a week. Associated with some intermittent lightheadedness. No shortness of breath no nausea vomiting no radiation of the pain. Patient's pain is worse with inspiration though and palpation d-dimer is negative so no evidence of a pulmonary embolism this may very well be chest wall pain. Troponins negative EKG is unchanged patient has been seen multiple times for this actually 3 times  on June 23. Patient symptoms do not seem to be consistent with pericarditis. No evidence of acute cardiac event no evidence of pulmonary embolism. Chest x-rays negative for pneumonia pneumothorax or pulmonary edema. Suspect this is chest wall pain. Patient will be discharged. Pain also not worse with laying flat so not as stated not consistent with pericarditis.  Vanetta MuldersScott Shadonna Benedick, MD 08/19/13 2034

## 2013-08-19 NOTE — Discharge Instructions (Signed)
Chest Wall Pain °Chest wall pain is pain in or around the bones and muscles of your chest. It may take up to 6 weeks to get better. It may take longer if you must stay physically active in your work and activities.  °CAUSES  °Chest wall pain may happen on its own. However, it may be caused by: °· A viral illness like the flu. °· Injury. °· Coughing. °· Exercise. °· Arthritis. °· Fibromyalgia. °· Shingles. °HOME CARE INSTRUCTIONS  °· Avoid overtiring physical activity. Try not to strain or perform activities that cause pain. This includes any activities using your chest or your abdominal and side muscles, especially if heavy weights are used. °· Put ice on the sore area. °¨ Put ice in a plastic bag. °¨ Place a towel between your skin and the bag. °¨ Leave the ice on for 15-20 minutes per hour while awake for the first 2 days. °· Only take over-the-counter or prescription medicines for pain, discomfort, or fever as directed by your caregiver. °SEEK IMMEDIATE MEDICAL CARE IF:  °· Your pain increases, or you are very uncomfortable. °· You have a fever. °· Your chest pain becomes worse. °· You have new, unexplained symptoms. °· You have nausea or vomiting. °· You feel sweaty or lightheaded. °· You have a cough with phlegm (sputum), or you cough up blood. °MAKE SURE YOU:  °· Understand these instructions. °· Will watch your condition. °· Will get help right away if you are not doing well or get worse. °Document Released: 02/12/2005 Document Revised: 05/07/2011 Document Reviewed: 10/09/2010 °ExitCare® Patient Information ©2015 [employer]. This information is not intended to replace advice given to you by your health care provider. Make sure you discuss any questions you have with your health care provider. ° °Chest Pain (Nonspecific) °It is often hard to give a diagnosis for the cause of chest pain. There is always a chance that your pain could be related to something serious, such as a heart attack or a blood clot in  the lungs. You need to follow up with your doctor. °HOME CARE °· If antibiotic medicine was given, take it as directed by your doctor. Finish the medicine even if you start to feel better. °· For the next few days, avoid activities that bring on chest pain. Continue physical activities as told by your doctor. °· Do not use any tobacco products. This includes cigarettes, chewing tobacco, and e-cigarettes. °· Avoid drinking alcohol. °· Only take medicine as told by your doctor. °· Follow your doctor's suggestions for more testing if your chest pain does not go away. °· Keep all doctor visits you made. °GET HELP IF: °· Your chest pain does not go away, even after treatment. °· You have a rash with blisters on your chest. °· You have a fever. °GET HELP RIGHT AWAY IF:  °· You have more pain or pain that spreads to your arm, neck, jaw, back, or belly (abdomen). °· You have shortness of breath. °· You cough more than usual or cough up blood. °· You have very bad back or belly pain. °· You feel sick to your stomach (nauseous) or throw up (vomit). °· You have very bad weakness. °· You pass out (faint). °· You have chills. °This is an emergency. Do not wait to see if the problems will go away. Call your local emergency services (911 in [place] Do not drive yourself to the hospital. °MAKE SURE YOU:  °· Understand these instructions. °· Will watch your   condition. °· Will get help right away if you are not doing well or get worse. °Document Released: 08/01/2007 Document Revised: 02/17/2013 Document Reviewed: 08/01/2007 °ExitCare® Patient Information ©2015 [employer]. This information is not intended to replace advice given to you by your health care provider. Make sure you discuss any questions you have with your health care provider. ° ° °Emergency Department Resource Guide °1) Find a Doctor and Pay Out of Pocket °Although you won't have to find out who is covered by your insurance plan, it is a good idea to ask around and  get recommendations. You will then need to call the office and see if the doctor you have chosen will accept you as a new patient and what types of options they offer for patients who are self-pay. Some doctors offer discounts or will set up payment plans for their patients who do not have insurance, but you will need to ask so you aren't surprised when you get to your appointment. ° °2) Contact Your Local Health Department °Not all health departments have doctors that can see patients for sick visits, but many do, so it is worth a call to see if yours does. If you don't know where your local health department is, you can check in your phone book. The CDC also has a tool to help you locate your state's health department, and many state websites also have listings of all of their local health departments. ° °3) Find a Walk-in Clinic °If your illness is not likely to be very severe or complicated, you may want to try a walk in clinic. These are popping up all over the country in pharmacies, drugstores, and shopping centers. They're usually staffed by nurse practitioners or physician assistants that have been trained to treat common illnesses and complaints. They're usually fairly quick and inexpensive. However, if you have serious medical issues or chronic medical problems, these are probably not your best option. ° °No Primary Care Doctor: °- Call [facility] at  832-8000 - they can help you locate a primary care doctor that  accepts your insurance, provides certain services, etc. °- Physician Referral Service- 1-800-533-3463 ° °Chronic Pain Problems: °Organization         Address  Phone   Notes  °[facility]  (336) 297-2271 Patients need to be referred by their primary care doctor.  ° °Medication Assistance: °Organization         Address  Phone   Notes  °[place] [facility] [address] °[place], [place] [address] (336) 641-8030 --Must be a resident of  [place] °-- Must have NO insurance coverage whatsoever (no Medicaid/ Medicare, etc.) °-- The pt. MUST have a primary care doctor that directs their care regularly and follows them in the community °  °[facility]  (866) 331-1348   °[employer]  (888) 892-1162   ° °Agencies that provide inexpensive medical care: °Organization         Address  Phone   Notes  °[facility]  (336) 832-8035   °[facility]    (336) 832-7272   °[facility] [address] °[place], [place] [address] (336) 832-4777   °[facility] [address], °[place] (336) 271-4999   °[facility]    (336) 373-0678   °[facility]    (336) 272-1050   °[facility] ° [address], [place] Phone:  (  336) 832-4444, Fax:  (336) 832-4440 Hours of Operation:  9 am - 6 pm, M-F.  Also accepts Medicaid/Medicare and self-pay.  °[facility] ° [address], [place] Phone: (336) 832-3150, Fax: (336) 832-3151. Hours of Operation:  8:30 am - 5:30 pm, M-F.  Also accepts Medicaid and self-pay.  °[facility] [place] [address], [place] Phone: (336) 878-6027   °[facility] [address], [place] (336)723-1848, Ext. 123 Mondays & Thursdays: 7-9 AM.  First 15 patients are seen on a first come, first serve basis. °  ° °Medicaid-accepting [place] Providers: ° °Organization         Address  Phone   Notes  °[facility] [address], [place] (336) 641-2100 Also accepts self-pay patients.  °[facility] [address], [place] ° (336) 856-9996   °[facility] [address], [place] (336) 288-8857   °[facility] [address] [place] [address], [place] (336) 299-7000   °Veita Bland [address], [place]  ° (336) 373-1557 Only accepts [place] Access  Medicaid patients after they have their name applied to their card.  ° °Self-Pay (no insurance) in [place]: ° °Organization         Address  Phone   Notes  °Sickle Cell Patients, [facility] [address], [place] (336) 832-1970   °[facility] [address], [place] (336) 832-4400   °[facility] ° [address], [place] (336) 992-4800   °[facility]/Dr. Osei-Bonsu ° [address] [place] [address], [place] or [address], [place] (336) 841-8500 Phone number for both [place] and [place] locations is the same.  °[facility] [address], [place] (336) 299-0000   °[facility] [address] [place] [address], [place] or [address] (336) 852-7530 °(336) 878-2260   °[facility] [address], [place] (336) 350-1642, phone; (336) 294-5005, fax Sees patients 1st and 3rd Saturday of every month.  Must not qualify for public or private insurance (i.e. Medicaid, Medicare, [place] Health Choice, Veterans' Benefits) • Household income should be no more than 200% of the poverty level •The clinic cannot treat you if you are pregnant or think you are pregnant • Sexually transmitted diseases are not treated at the clinic.  ° ° °Dental Care: °Organization         Address  Phone  Notes  °[facility] [facility] [address], [place] (336) 641-6152 Accepts children up to age 21 who are enrolled in Medicaid or [place] Health Choice; pregnant women with a Medicaid card; and children who have applied for Medicaid or [place] Health Choice, but were declined, whose parents can pay a reduced fee at time of service.  °[facility] [place]  [address], [place] (336) 641-7733 Accepts children up to age 21 who are enrolled in Medicaid or [place] Health Choice; pregnant women with a Medicaid  card; and children who have applied for Medicaid or  Health Choice, but were declined, whose parents can pay a reduced fee at time of service.  °[facility] ° [address],  (336) 641-4533 Patients are seen by appointment only. Walk-ins   are not accepted. [facility] will see patients 18 years of age and older. °Monday - Tuesday (8am-5pm) °Most Wednesdays (8:30-5pm) °$30 per visit, cash only  °[facility] ° [address], [place] (336) 641-4533 Patients are seen by appointment only. Walk-ins are not accepted. [facility] will see patients 18 years of age and older. °One Wednesday Evening (Monthly: Volunteer Based).  $30 per visit, cash only  °[facility]  (919) 537-3737 for adults; Children under age 4, call Graduate Pediatric Dentistry at ([phone]) [phone]. Children aged 4-14, please call (919) [phone] to request a pediatric application. ° Dental services are provided in all areas of dental care including fillings, crowns and bridges, complete and partial dentures, implants, gum treatment, root canals, and extractions. Preventive care is also provided. Treatment is provided to both adults and children. °Patients are selected via a lottery and there is often a waiting list. °  °[facility] [address], °[place] ° (336) 763-8833 [email or web address] °  °[facility] [address], [place], [place] (336)723-1848, Ext. 123 Second and Fourth Thursday of each month, opens at 6:30 AM; Clinic ends at 9 AM.  Patients are seen on a first-come first-served basis, and a limited number are seen during each clinic.  ° °[facility] ° [address], [place], [place] (336) 723-7904   Eligibility Requirements °You must have lived in [place], [place], or [place] counties for at least the last three months. °  You cannot be eligible for state or federal sponsored healthcare insurance,  including Veterans Administration, Medicaid, or Medicare. °  You generally cannot be eligible for healthcare insurance through your employer.  °  How to apply: °Eligibility screenings are held every Tuesday and Wednesday afternoon from 1:00 pm until 4:00 pm. You do not need an appointment for the interview!  °[facility] [address], [place], [place] 336-631-2330   °[facility]  336-342-8273   °[facility]  336-703-3100   °[facility]  336-570-6415   ° °Behavioral Health Resources in the Community: °Intensive Outpatient Programs °Organization         Address  Phone  Notes  °[facility] [address], [place], [place] 336-878-6098   °[facility] [address], [place], [place] 336-832-9800   °[facility] [address], [place], [place] ° 336-882-2125   °[facility] [address],  °[place], [place] 1-800-853-5163 or 336-641-4981   °Substance Abuse Resources °Organization         Address  Phone  Notes  °[facility]  336-882-2125   °[facility]  336-784-9470   °[facility]  336-285-9073   °[facility]  336-845-3988   °Residential & Outpatient Substance Abuse Program  1-800-659-3381   °Psychological Services °Organization         Address  Phone  Notes  °[facility]  336- 832-9600   °[facility]  336- 378-7881   °[facility] [address], [place] 1-800-853-5163 or 336-641-4981   ° °Mobile Crisis Teams °Organization         Address  Phone  Notes  °[facility], Mobile Crisis Care Unit  1-877-626-1772   °Assertive °[facility] ° [address] [place], [place] 336-834-9664   °Sharon DeEsch [address] °[place] 336-554-5454   ° °Self-Help/Support   Groups °Organization         Address  Phone             Notes  °[facility] [place] - variety of support groups  336- 373-1402 Call for more information  °Narcotics Anonymous (NA), [facility] [address], °[place]  2 meetings at this location  ° °Residential Treatment Programs °Organization         Address  Phone  Notes  °[facility] [address],    °[place]  1-866-801-8205   °[facility] ° [address], [place], [place] 704-293-8524   °[facility] [address], [place] 336-845-3988 Admissions: 8am-3pm M-F  °[facility] [address]    °[place], [place] 336-841-1104   °[facility] [address], [place], [place] 336-379-7146   °[facility] [address]  °[place], [place] 336-285-9073   °[facility] - Intensive Outpatient [address], , [place] 336-852-3033   °[facility] [address]  °[place], [place] 1-877-615-2722 or 336-784-9470   °[facility] [address] [place], [place] 336-227-7417 Accepts Medicaid  °[facility] [address]  ° [place] 1-800-659-3381 Substance Abuse/Addiction Treatment  ° °[place] Behavioral Health Resources °Organization         Address  Phone  Notes  °[facility]  (888) 581-9988   °Julie Brannon, PhD [address] [place], [place]   (336) 349-5553 or (336) 951-0000   °[facility]   [address] °[place], [place] (336) 349-4454   °[facility] [address], [place], [place] (336) 342-8316 Insurance/Medicaid/sponsorship through [facility]  °[facility] [address]                                    [place], [place] (336) 342-8316 Therapy/tele-psych/case  °[facility] [address]  ° [place], [place] (336) 349-2233    °Dr. Arfeen  (336) 349-4544   °[facility] [place]  [employer] [place] [facility] 1) [address], [place] °2)  [address], [place] °3)  [address], [place] (336) 349-3220 °(336) 342-7768 ° °(336) 342-8140   °[place] [facility] (336) 342-1394 or (336) 342-3537 (After Hours)    ° ° ° °

## 2013-08-19 NOTE — ED Notes (Signed)
Per EMS pt c/o midsternal 8/10 constant chest pain x 1 week. Endorses intermittent lightheadedness. Pt denies SOB, nausea, vomiting or radiation. Patients CP worse with inspiration and palpation. Pt ambulatory in NAD.   12-lead- sinus rhythm with LVH.

## 2013-08-20 ENCOUNTER — Encounter (HOSPITAL_COMMUNITY): Payer: Self-pay | Admitting: Emergency Medicine

## 2013-08-20 ENCOUNTER — Emergency Department (HOSPITAL_COMMUNITY)
Admission: EM | Admit: 2013-08-20 | Discharge: 2013-08-20 | Disposition: A | Discharge status: Home / Self Care | Payer: Medicaid Other | Source: Home / Self Care | Attending: Emergency Medicine | Admitting: Emergency Medicine

## 2013-08-20 ENCOUNTER — Emergency Department (HOSPITAL_COMMUNITY)
Admission: EM | Admit: 2013-08-20 | Discharge: 2013-08-21 | Disposition: A | Discharge status: Home / Self Care | Payer: Medicaid Other | Attending: Emergency Medicine | Admitting: Emergency Medicine

## 2013-08-20 DIAGNOSIS — Z5900 Homelessness unspecified: Secondary | ICD-10-CM | POA: Insufficient documentation

## 2013-08-20 DIAGNOSIS — F17200 Nicotine dependence, unspecified, uncomplicated: Secondary | ICD-10-CM

## 2013-08-20 DIAGNOSIS — Z87442 Personal history of urinary calculi: Secondary | ICD-10-CM

## 2013-08-20 DIAGNOSIS — Z79899 Other long term (current) drug therapy: Secondary | ICD-10-CM

## 2013-08-20 DIAGNOSIS — J45909 Unspecified asthma, uncomplicated: Secondary | ICD-10-CM | POA: Insufficient documentation

## 2013-08-20 DIAGNOSIS — R071 Chest pain on breathing: Secondary | ICD-10-CM | POA: Insufficient documentation

## 2013-08-20 DIAGNOSIS — R0789 Other chest pain: Secondary | ICD-10-CM

## 2013-08-20 DIAGNOSIS — M25572 Pain in left ankle and joints of left foot: Secondary | ICD-10-CM

## 2013-08-20 DIAGNOSIS — M2550 Pain in unspecified joint: Secondary | ICD-10-CM

## 2013-08-20 DIAGNOSIS — G40909 Epilepsy, unspecified, not intractable, without status epilepticus: Secondary | ICD-10-CM | POA: Insufficient documentation

## 2013-08-20 DIAGNOSIS — M25569 Pain in unspecified knee: Secondary | ICD-10-CM | POA: Insufficient documentation

## 2013-08-20 DIAGNOSIS — M25579 Pain in unspecified ankle and joints of unspecified foot: Secondary | ICD-10-CM | POA: Insufficient documentation

## 2013-08-20 DIAGNOSIS — M25561 Pain in right knee: Secondary | ICD-10-CM

## 2013-08-20 HISTORY — DX: Homelessness: Z59.0

## 2013-08-20 HISTORY — DX: Homelessness unspecified: Z59.00

## 2013-08-20 HISTORY — DX: Calculus of kidney: N20.0

## 2013-08-20 NOTE — ED Notes (Signed)
Pt. arrived with EMS reports mid chest pain onset this evening , denies SOB /nausea or diaphoresis .

## 2013-08-20 NOTE — ED Notes (Signed)
Pt provided w/ ice pack

## 2013-08-20 NOTE — Discharge Instructions (Signed)
Chest Wall Pain °Chest wall pain is pain felt in or around the chest bones and muscles. It may take up to 6 weeks to get better. It may take longer if you are active. Chest wall pain can happen on its own. Other times, things like germs, injury, coughing, or exercise can cause the pain. °HOME CARE  °· Avoid activities that make you tired or cause pain. Try not to use your chest, belly (abdominal), or side muscles. Do not use heavy weights. °· Put ice on the sore area. °¨ Put ice in a plastic bag. °¨ Place a towel between your skin and the bag. °¨ Leave the ice on for 15-20 minutes for the first 2 days. °· Only take medicine as told by your doctor. °GET HELP RIGHT AWAY IF:  °· You have more pain or are very uncomfortable. °· You have a fever. °· Your chest pain gets worse. °· You have new problems. °· You feel sick to your stomach (nauseous) or throw up (vomit). °· You start to sweat or feel lightheaded. °· You have a cough with mucus (phlegm). °· You cough up blood. °MAKE SURE YOU:  °· Understand these instructions. °· Will watch your condition. °· Will get help right away if you are not doing well or get worse. °Document Released: 08/01/2007 Document Revised: 05/07/2011 Document Reviewed: 10/09/2010 °ExitCare® Patient Information ©2015 [employer]. This information is not intended to replace advice given to you by your health care provider. Make sure you discuss any questions you have with your health care provider. ° °

## 2013-08-20 NOTE — ED Notes (Addendum)
Per EMS - pt has been walking a lot today and began having left ankle and right leg pain - no known mechanism of injury - CMS intact.

## 2013-08-20 NOTE — ED Notes (Signed)
Patient was discharged from here at 1758.  All labs and EKG normal.  States his pain is 7/10 non radiating and increases when walking in the heat   Has all clothing with him

## 2013-08-20 NOTE — ED Provider Notes (Signed)
CSN: [id number]     Arrival date & time 08/20/13  0037 History   First MD Initiated Contact with Patient 08/20/13 [id number]     Chief Complaint  Patient presents with  . Chest Pain     (Consider location/radiation/quality/duration/timing/severity/associated sxs/prior Treatment) HPI 28 year old male presents to the emergency department with complaint of chest pain.  This is the patient's seventh ED visit in the last 5 days.  Patient was discharged around 6 PM from this ED with same complaint.  Patient has had a thorough workup over the last week for his chest pain.  To me he reports that he is homeless and wants a place to sleep and also has chest pain.  Patient was unable to get in with a homeless shelter today.  Patient reports pain is in the same location.  Sharp, nonradiating worse with palpation. Past Medical History  Diagnosis Date  . Seizures   . Asthma   . Kidney stone   . Homelessness    History reviewed. No pertinent past surgical history. No family history on file. History  Substance Use Topics  . Smoking status: Current Every Day Smoker -- 1.00 packs/day for 3 years    Types: Cigarettes  . Smokeless tobacco: Not on file  . Alcohol Use: No     Comment: occasionally     Review of Systems  See History of Present Illness; otherwise all other systems are reviewed and negative   Allergies  Review of patient's allergies indicates no known allergies.  Home Medications   Prior to Admission medications   Medication Sig Start Date End Date Taking? Authorizing Provider  divalproex (DEPAKOTE) 250 MG DR tablet Take 250 mg by mouth 3 (three) times daily.   Yes Historical Provider, MD  ferrous fumarate (HEMOCYTE - 106 MG FE) 325 (106 FE) MG TABS tablet Take 1 tablet by mouth every morning.   Yes Historical Provider, MD   BP 125/78  Pulse 73  Temp(Src) 98 F (36.7 C) (Oral)  Resp 16  SpO2 95% Physical Exam  Nursing note and vitals reviewed. Constitutional: He is oriented to  person, place, and time. He appears well-developed and well-nourished.  HENT:  Head: Normocephalic and atraumatic.  Nose: Nose normal.  Mouth/Throat: Oropharynx is clear and moist.  Eyes: Conjunctivae and EOM are normal. Pupils are equal, round, and reactive to light.  Neck: Normal range of motion. Neck supple. No JVD present. No tracheal deviation present. No thyromegaly present.  Cardiovascular: Normal rate, regular rhythm, normal heart sounds and intact distal pulses.  Exam reveals no gallop and no friction rub.   No murmur heard. Pulmonary/Chest: Effort normal and breath sounds normal. No stridor. No respiratory distress. He has no wheezes. He has no rales. He exhibits tenderness (tender to palpation along sternum).  Abdominal: Soft. Bowel sounds are normal. He exhibits no distension and no mass. There is no tenderness. There is no rebound and no guarding.  Musculoskeletal: Normal range of motion. He exhibits no edema and no tenderness.  Lymphadenopathy:    He has no cervical adenopathy.  Neurological: He is alert and oriented to person, place, and time. He exhibits normal muscle tone. Coordination normal.  Skin: Skin is dry. No rash noted. No erythema. No pallor.  Psychiatric:  Flat affect, depressed appearing    ED Course  Procedures (including critical care time) Labs Review Labs Reviewed - No data to display  Imaging Review Dg Chest 2 View  08/19/2013   CLINICAL DATA:  Chest pain and  shortness of Breath.  EXAM: CHEST  2 VIEW  COMPARISON:  None.  FINDINGS: The cardiac silhouette, mediastinal and hilar contours are within normal limits the lungs are clear. No pleural effusion. No pneumothorax. The bony thorax is intact.  IMPRESSION: Normal chest x-ray.   Electronically Signed   By: Loralie ChampagneMark  Gallerani M.D.   On: 08/19/2013 19:08     EKG Interpretation   Date/Time:  Thursday August 20 2013 00:41:19 EDT Ventricular Rate:  97 PR Interval:  126 QRS Duration: 90 QT Interval:  344 QTC  Calculation: 436 R Axis:   64 Text Interpretation:  Normal sinus rhythm Minimal voltage criteria for  LVH, may be normal variant Early repolarization Borderline ECG No  significant change since last tracing Confirmed by OTTER  MD, OLGA ([id number])  on 08/20/2013 3:57:55 AM      MDM   Final diagnoses:  Homeless  Chest wall pain    28 year old male who admits to coming to the emergency department for a place to sleep as well as further evaluation of his chest pain.  Patient has had very thorough workup over the last 5 days.  He has had negative d-dimer, negative chest x-ray, persistently negative troponins, EKG with early repolarization.  Nursing staff has provided patient with a shower.  He is stable for discharge.    Olivia Mackielga M Otter, MD 08/20/13 [id number]

## 2013-08-20 NOTE — ED Notes (Signed)
Pt was here earlier for same sx, states his cp has increased since he was seen here earlier. Pt rates pain 7/10, describes pain as central chest, non radiating and sharp in nature. Pt in and out of sleep while this nurse is assessing him.

## 2013-08-21 ENCOUNTER — Emergency Department (HOSPITAL_COMMUNITY)
Admission: EM | Admit: 2013-08-21 | Discharge: 2013-08-21 | Disposition: A | Discharge status: Home / Self Care | Payer: Medicaid Other | Source: Home / Self Care | Attending: Emergency Medicine | Admitting: Emergency Medicine

## 2013-08-21 ENCOUNTER — Emergency Department (HOSPITAL_COMMUNITY): Payer: Medicaid Other

## 2013-08-21 ENCOUNTER — Encounter (HOSPITAL_COMMUNITY): Payer: Self-pay | Admitting: Emergency Medicine

## 2013-08-21 DIAGNOSIS — M25569 Pain in unspecified knee: Secondary | ICD-10-CM

## 2013-08-21 DIAGNOSIS — Z79899 Other long term (current) drug therapy: Secondary | ICD-10-CM | POA: Insufficient documentation

## 2013-08-21 DIAGNOSIS — M25579 Pain in unspecified ankle and joints of unspecified foot: Secondary | ICD-10-CM

## 2013-08-21 DIAGNOSIS — Z5900 Homelessness unspecified: Secondary | ICD-10-CM | POA: Insufficient documentation

## 2013-08-21 DIAGNOSIS — R109 Unspecified abdominal pain: Secondary | ICD-10-CM | POA: Insufficient documentation

## 2013-08-21 DIAGNOSIS — J45909 Unspecified asthma, uncomplicated: Secondary | ICD-10-CM

## 2013-08-21 DIAGNOSIS — F17200 Nicotine dependence, unspecified, uncomplicated: Secondary | ICD-10-CM

## 2013-08-21 DIAGNOSIS — G40909 Epilepsy, unspecified, not intractable, without status epilepticus: Secondary | ICD-10-CM

## 2013-08-21 LAB — URINE MICROSCOPIC-ADD ON

## 2013-08-21 LAB — COMPREHENSIVE METABOLIC PANEL
ALK PHOS: 62 U/L (ref 39–117)
ALT: 21 U/L (ref 0–53)
AST: 19 U/L (ref 0–37)
Albumin: 4.3 g/dL (ref 3.5–5.2)
BILIRUBIN TOTAL: 0.6 mg/dL (ref 0.3–1.2)
BUN: 25 mg/dL — AB (ref 6–23)
CHLORIDE: 96 meq/L (ref 96–112)
CO2: 22 mEq/L (ref 19–32)
Calcium: 9.6 mg/dL (ref 8.4–10.5)
Creatinine, Ser: 1.59 mg/dL — ABNORMAL HIGH (ref 0.50–1.35)
GFR, EST AFRICAN AMERICAN: 68 mL/min — AB (ref >90)
GFR, EST NON AFRICAN AMERICAN: 59 mL/min — AB (ref >90)
GLUCOSE: 83 mg/dL (ref 70–99)
Potassium: 3.7 mEq/L (ref 3.7–5.3)
Sodium: 135 mEq/L — ABNORMAL LOW (ref 137–147)
Total Protein: 7.2 g/dL (ref 6.0–8.3)

## 2013-08-21 LAB — CBC WITH DIFFERENTIAL/PLATELET
BASOS PCT: 0 % (ref 0–1)
Basophils Absolute: 0 10*3/uL (ref 0.0–0.1)
Eosinophils Absolute: 0 10*3/uL (ref 0.0–0.7)
Eosinophils Relative: 0 % (ref 0–5)
HEMATOCRIT: 34.3 % — AB (ref 39.0–52.0)
HEMOGLOBIN: 11.4 g/dL — AB (ref 13.0–17.0)
Lymphocytes Relative: 26 % (ref 12–46)
Lymphs Abs: 1.8 10*3/uL (ref 0.7–4.0)
MCH: 25.3 pg — AB (ref 26.0–34.0)
MCHC: 33.2 g/dL (ref 30.0–36.0)
MCV: 76.2 fL — ABNORMAL LOW (ref 78.0–100.0)
MONOS PCT: 8 % (ref 3–12)
Monocytes Absolute: 0.5 10*3/uL (ref 0.1–1.0)
NEUTROS ABS: 4.4 10*3/uL (ref 1.7–7.7)
Neutrophils Relative %: 66 % (ref 43–77)
Platelets: 254 10*3/uL (ref 150–400)
RBC: 4.5 MIL/uL (ref 4.22–5.81)
RDW: 13.4 % (ref 11.5–15.5)
WBC: 6.7 10*3/uL (ref 4.0–10.5)

## 2013-08-21 LAB — URINALYSIS, ROUTINE W REFLEX MICROSCOPIC
Glucose, UA: NEGATIVE mg/dL
Hgb urine dipstick: NEGATIVE
KETONES UR: 15 mg/dL — AB
NITRITE: NEGATIVE
PROTEIN: 30 mg/dL — AB
Specific Gravity, Urine: 1.03 (ref 1.005–1.030)
UROBILINOGEN UA: 1 mg/dL (ref 0.0–1.0)
pH: 6 (ref 5.0–8.0)

## 2013-08-21 LAB — LIPASE, BLOOD: LIPASE: 35 U/L (ref 11–59)

## 2013-08-21 NOTE — Progress Notes (Addendum)
CSW was informed by Amy RNCM that Dr. Ethelda ChickJacubowitz requested that the social worker assist the patient with his homelessness issue.  CSW called all of the shelters in the [place] and [address] [place] areas but they were all full.  CSW spoke with staff at [facility] [address] long-term shelter that reports that their application process began July 1st.  The patient would need to come there on the 1st to set up an appointment to complete an interview and the only criteria is to not have a sex offender charge.  CSW called the shelters in [place] [facility] [place] and they are full at this time.  CSW spoke with the staff with the Crisis Shelter in [place] who has beds and the patient can be there by 5 or 6pm.    CSW spoke with the patient about his current living situation.  Patient reports relocating here within the last 2 weeks from [place], [place] with his friend because he did not like his grandmother treating him like a child.  He reports he receives a monthly income of more than $700 and his do not have a payee.  CSW informed the patient of the shelter information previously mentioned and he refused the option [place] go [place].  He refused because his [facility] still [place] [facility] [employer] and he is worried to the friend's well being.  Patient reports that he and the friend was stay with a friend but there was a conflict and now they are sleeping [place] [employer] [place].  CSW asked the patient if he could return to the grandmother's home in [place], [place] until he can make a better plan to be independent.  He said yes but refused the option.  CSW asked the patient if he was open to renting a room or paying about $300 rent month for an apartment? The patient appeared confused but agreed therefore CSW provided the patient with a website [email or web address] to search for housing.  No further assistance is needed at this time.    Maryelizabeth Rowanressa Devaunte Gasparini, MSW, BenaLCSWA, 08/21/2013 Evening Clinical Social  Worker [phone]

## 2013-08-21 NOTE — ED Provider Notes (Signed)
Medical screening examination/treatment/procedure(s) were performed by non-physician practitioner and as supervising physician I was immediately available for consultation/collaboration.   EKG Interpretation None       Olga M Otter, MD 08/21/13 1548 

## 2013-08-21 NOTE — ED Notes (Addendum)
Per ems pt had been outside walking, stopped [facility] [employer], called 911 for abdominal pain and cramping. Hx of being homeless.

## 2013-08-21 NOTE — Discharge Instructions (Signed)
Please keep knee in the sleeve and ankle an ankle brace Please rest and stay hydrated Please rest, ice, elevate-toes above nose Please avoid any strenuous activity Please continue to monitor symptoms closely and if symptoms are to worsen or change (fever greater than 101, chills, chest pain, shortness of breath, difficulty breathing, numbness, tingling, worsening or changes to pain pattern, swelling, redness, hot to the touch, loss of sensation, red streaks) please report back to the ED immediately   Arthralgia Your caregiver has diagnosed you as suffering from an arthralgia. Arthralgia means there is pain in a joint. This can come from many reasons including:  Bruising the joint which causes soreness (inflammation) in the joint.  Wear and tear on the joints which occur as we grow older (osteoarthritis).  Overusing the joint.  Various forms of arthritis.  Infections of the joint. Regardless of the cause of pain in your joint, most of these different pains respond to anti-inflammatory drugs and rest. The exception to this is when a joint is infected, and these cases are treated with antibiotics, if it is a bacterial infection. HOME CARE INSTRUCTIONS   Rest the injured area for as long as directed by your caregiver. Then slowly start using the joint as directed by your caregiver and as the pain allows. Crutches as directed may be useful if the ankles, knees or hips are involved. If the knee was splinted or casted, continue use and care as directed. If an stretchy or elastic wrapping bandage has been applied today, it should be removed and re-applied every 3 to 4 hours. It should not be applied tightly, but firmly enough to keep swelling down. Watch toes and feet for swelling, bluish discoloration, coldness, numbness or excessive pain. If any of these problems (symptoms) occur, remove the ace bandage and re-apply more loosely. If these symptoms persist, contact your caregiver or return to this  location.  For the first 24 hours, keep the injured extremity elevated on pillows while lying down.  Apply ice for 15-20 minutes to the sore joint every couple hours while awake for the first half day. Then 03-04 times per day for the first 48 hours. Put the ice in a plastic bag and place a towel between the bag of ice and your skin.  Wear any splinting, casting, elastic bandage applications, or slings as instructed.  Only take over-the-counter or prescription medicines for pain, discomfort, or fever as directed by your caregiver. Do not use aspirin immediately after the injury unless instructed by your physician. Aspirin can cause increased bleeding and bruising of the tissues.  If you were given crutches, continue to use them as instructed and do not resume weight bearing on the sore joint until instructed. Persistent pain and inability to use the sore joint as directed for more than 2 to 3 days are warning signs indicating that you should see a caregiver for a follow-up visit as soon as possible. Initially, a hairline fracture (break in bone) may not be evident on X-rays. Persistent pain and swelling indicate that further evaluation, non-weight bearing or use of the joint (use of crutches or slings as instructed), or further X-rays are indicated. X-rays may sometimes not show a small fracture until a week or 10 days later. Make a follow-up appointment with your own caregiver or one to whom we have referred you. A radiologist (specialist in reading X-rays) may read your X-rays. Make sure you know how you are to obtain your X-ray results. Do not assume everything is  normal if you do not hear from us. SEEK MEDICAL CARE IF: Bruising, swelling, or pain increases. SEEK IMMEDIATE MEDICAL CARE IF:   Your fingers or toes are numb or blue.  The pain is not responding to medications and continues to stay the same or get worse.  The pain in your joint becomes severe.  You develop a fever over 102 F  (38.9 C).  It becomes impossible to move or use the joint. MAKE SURE YOU:   Understand these instructions.  Will watch your condition.  Will get help right away if you are not doing well or get worse. Document Released: 02/12/2005 Document Revised: 05/07/2011 Document Reviewed: 10/01/2007 [facility] Patient Information 2015 [place] [employer], [place]. This information is not intended to replace advice given to you by your health care provider. Make sure you discuss any questions you have with your health care provider.   Emergency Department Resource Guide 1) Find a Doctor and Pay Out of Pocket Although you won't have to find out who is covered by your insurance plan, it is a good idea to ask around and get recommendations. You will then need to call the office and see if the doctor you have chosen will accept you as a new patient and what types of options they offer for patients who are self-pay. Some doctors offer discounts or will set up payment plans for their patients who do not have insurance, but you will need to ask so you aren't surprised when you get to your appointment.  2) Contact Your Local Health Department Not all health departments have doctors that can see patients for sick visits, but many do, so it is worth a call to see if yours does. If you don't know where your local health department is, you can check in your phone book. The CDC also has a tool to help you locate your state's health department, and many state websites also have listings of all of their local health departments.  3) Find a Walk-in Clinic If your illness is not likely to be very severe or complicated, you may want to try a walk in clinic. These are popping up all over the country in pharmacies, drugstores, and shopping centers. They're usually staffed by nurse practitioners or physician assistants that have been trained to treat common illnesses and complaints. They're usually fairly quick and inexpensive. However, if  you have serious medical issues or chronic medical problems, these are probably not your best option.  No Primary Care Doctor: - Call Health Connect at  [phone] - they can help you locate a primary care doctor that  accepts your insurance, provides certain services, etc. - Physician Referral Service- [phone]  Chronic Pain Problems: Organization         Address  Phone   Notes  [facility]  [phone] Patients need to be referred by their primary care doctor.   Medication Assistance: Organization         Address  Phone   Notes  [facility] Medication Safety [facility] [address] [place], [place] [address] [phone]) [phone] --Must be a resident of [facility] [place] -- Must have NO insurance coverage whatsoever (no Medicaid/ Medicare, etc.) -- The pt. MUST have a primary care doctor that directs their care regularly and follows them in the community   MedAssist  [phone]) [phone]   [employer]  [phone]    Agencies that provide inexpensive medical care: Organization  Address  Phone   Notes  [facility]  [phone]) [phone]   [facility]    [phone]   [facility] [address] [place], [place] [address] [phone]) [phone]   [facility] [place] [address], [place] [phone]) [phone]   Planned Parenthood    [phone]   [facility]    [phone]   [facility]  [address], [place] Phone:  [phone], Fax:  [phone] Hours of Operation:  9 am - 6 pm, M-F.  Also accepts Medicaid/Medicare and self-pay.  [facility]  [address], [place] Phone: [phone], Fax: [phone]. Hours of Operation:  8:30 am - 5:30 pm, M-F.  Also accepts Medicaid and self-pay.  [facility] [place] [address], [place] Phone: [phone]) [phone]   [facility] [address], [place], [place] [phone], Ext. [phone] Mondays & Thursdays: 7-9 AM.  First 15 patients are seen on a first come, first serve basis.    Medicaid-accepting [facility] Providers:  Organization         Address  Phone   Notes  [facility] [address], [place] [phone] Also accepts self-pay patients.  [facility] [address], [place]  [phone]) [phone]   [facility] [address], [address], [place] [phone](336) [phone]   [facility] [address] [place] [address], [place] [phone]   Renaye RakersVeita Bland [address], [place]   [phone]) [phone] Only accepts [place] Access [place]icaid patients after they have their name applied to their card.   Self-Pay (no insurance) in [facility] [place]:  Organization         Address  Phone   Notes  Sickle Cell Patients, [facility] [address], [place] [phone]) [phone]   [facility] [address], [place] [phone]) [phone]   [facility] [place]  [address], [address],  [phone](336) [phone]   [facility]/Dr. Osei-Bonsu  [address] [place] [address], [place] or [id number], [place] [phone] Phone number for both [place] and [place] locations is the same.  [facility] [address], [place] [phone]) [phone]   [facility] [place] [address] [place] [address], [place] or [address] [phone](336) [phone]   [facility] [address], [place] [phone]) [phone], phone; [phone](336) [phone], fax Sees patients 1st and 3rd Saturday of every month.  Must not qualify for public or private insurance (i.e. Medicaid, Medicare, [place] Health Choice, Veterans' Benefits)  Household income should be no more than 200% of the poverty level The clinic cannot treat you if you are pregnant or think you are  pregnant  Sexually transmitted diseases are not treated at the clinic.    Dental Care: Organization         Address  Phone  Notes  [facility] [address], [place] [phone]) [phone] Accepts children up to age 10521 who are enrolled in [place]icaid or [place] Health Choice; pregnant women with a Medicaid card; and children who have applied for Medicaid or [place] Health Choice, but were declined, whose parents can pay a reduced fee at time  of service.  [facility] Department of [facility] [place]  [address], [place] [phone]) [phone] Accepts children up to age 26 who are enrolled in [place]icaid or [place] Health Choice; pregnant women with a Medicaid card; and children who have applied for Medicaid or [place] Health Choice, but were declined, whose parents can pay a reduced fee at time of service.  [facility]  [address], [place] [phone]) [phone] Patients are seen by appointment only. Walk-ins are not accepted. [facility] will see patients 26 years of age and older. Monday - Tuesday (8am-5pm) Most Wednesdays (8:30-5pm) $30 per visit, cash only  [facility]  [address], [facility] [place] [place] [phone] Patients are seen by appointment only. Walk-ins are not accepted. [facility] will see patients 26 years of age and older. One Wednesday Evening (Monthly: Volunteer Based).  $30 per visit, cash only  [facility]  [phone] for adults; Children under age 134, call Graduate Pediatric Dentistry at [phone]. Children aged 344-14, please call [phone] to request a pediatric application.  Dental services are provided in all areas of dental care including fillings, crowns and bridges, complete and partial dentures, implants, gum treatment, root canals, and extractions. Preventive care is also provided. Treatment is provided to both adults and  children. Patients are selected via a lottery and there is often a waiting list.   [facility] [address], [place]  [phone]) [phone] [email or web address]   [facility] [address], [place], [place] [phone], Ext. [phone] Second and Fourth Thursday of each month, opens at 6:30 AM; Clinic ends at 9 AM.  Patients are seen on a first-come first-served basis, and a limited number are seen during each clinic.   [facility]  [address], [place], [place] [phone]) [phone]   Eligibility Requirements You must have lived in [place], [place], or [place] counties for at least the last three months.   You cannot be eligible for state or federal sponsored National Cityhealthcare insurance, including CIGNAVeterans Administration, [place]icaid, or Harrah[employer] EntertainmentMedicare.   You generally cannot be eligible for healthcare insurance through your employer.    How to apply: Eligibility screenings are held every Tuesday and Wednesday afternoon from 1:00 pm until 4:00 pm. You do not need an appointment for the interview!  [facility] [address], [place], [place] [phone]   [facility]  [phone]   [facility]  [phone]   [facility]  [phone]    Behavioral Health Resources in the Community: Intensive Outpatient Programs Organization         Address  Phone  Notes  [facility] [address], [place], [place] [phone]   [facility] [address], [place], [place] [phone]   [facility] [address], [place], [place]  [phone]   [facility] [address],  [place], [place] [phone] or [phone]   Substance Abuse Resources Organization         Address  Phone  Notes  [facility] Drug Services  [phone]   [facility]  [phone]   [facility]  [phone]    [facility]  [phone]   Residential & Outpatient Substance Abuse Program  [phone]   Psychological Services Organization         Address  Phone  Notes  [facility]  [phone]- [phone]   [facility]  [phone]   [facility] [address], [place] [phone] or [phone]    Mobile Crisis Teams Organization         Address  Phone  Notes  [facility], Mobile Crisis Care Unit  [phone]   [facility]  [address] [place], [place] [phone]   Doristine LocksSharon DeEsch [address] [place] [phone]    Self-Help/Support Groups Organization         Address  Phone             Notes  [facility]ental Health Assoc. of [place] - variety of support groups  336- [phone] Call for more information  Narcotics Anonymous (NA), Caring Services [address], [place]  2 meetings at this location   Statisticianesidential Treatment Programs Organization         Address  Phone  Notes  [facility] [address],    [place]  [phone]   [facility]  [address], [place] [phone], [place], [place] [phone]   [facility] [address], [place] [phone] Admissions: 8am-3pm M-F  [facility] [address]    [place], [place] [phone]   [facility] [address], [place], [place] [phone]   [facility] [address]  [place], [place] [phone]   [facility] - Intensive Outpatient [address], [place], [place] [phone]   [facility] ([facility] Recovery [facility] [address]  [place], [place] [phone] or [phone]   [facility] (RTS) [address] [place], [place] [phone] Accepts Medicaid  [facility] [address]  [place] [phone] Substance Abuse/Addiction Treatment   [facility]  Behavioral Health Resources Organization         Address  Phone  Notes  [facility]  [phone]   Angie FavaJulie Brannon, PhD [address], [address] [place], [place]   [phone]) [phone] or [phone]) [phone]   [facility]   [address] [place], [place] [phone](336) [phone]   [facility] [address], [place], [place] [phone]) [phone] Insurance/Medicaid/sponsorship through [facility]  Faith and Families [address]                                    [place], [place] [phone] Therapy/tele-psych/case  [facility] [address]   [place], [place] [phone]) [phone]    Dr. Lolly MustacheArfeen  [phone](336) [phone]   [facility] [place]  [employer] [facility] 1) [address], [place] 2) [address], [place] 3)  [address], [place] [phone]) [phone](336) [phone]  [phone](336) [phone]   [facility] Child Abuse Hotline [phone](336) [phone] or [phone]) [phone] (After Hours)

## 2013-08-21 NOTE — Progress Notes (Signed)
  CARE MANAGEMENT ED NOTE 08/21/2013  Patient:  Justin Logan,Justin Logan   Account Number:  [id number]  Date Initiated:  08/21/2013  Documentation initiated by:  Edd ArbourGIBBS,KIMBERLY  Subjective/Objective Assessment:   28 yr old medicaid [place] pt states he has been in [place] States he & his friend "Cleopatra CedarDerrick Herring" decided to leave there homes because they were tired of living at home & plans are     Subjective/Objective Assessment Detail:   to "go to [place] by the first of the month" Pt lived with "my grandma. I called her to tell her I was okay with my cell phone" Reports Ladene ArtistDerrick has a cell phone also  Pt with 9 [facility] ED visits since 08/15/13  Derrick called pt's ED room (#23) while CM was present in the room with pt. Derrick asked where pt was taken by EMS. Pt states [facility] ArtistDer[facility] but could not find him.  Pt states Ladene ArtistDerrick is either coming to [place] ar returning to the [employer]"library where we was at when my stomach started cramping" "this is the first I have had to eat all day"  Pt report shaving his medicine with him Informed CM he takes "Depakote, Iron and Onfi "  Reports not seeing a doctor before leaving [place]  Pt states he did not know about [facility] and they had [facility] & was told about [facility] being [facility]     Action/Plan:   ED Cm consulted by EDP, Nurse, mental healthJacubowitz and ED RN, Youth workeratalie with concern for hunger pt needing a place to stay and food CM reviewed [facility] Pt did not know about [facility] services nor did he know he need to change medicaid from county to county in [place] and   Action/Plan Detail:   from state to state especially if he plans to go to [place] CM explained all of this to him Provided [place] DSS information Referal to ED SW Reviewed cas info with her   Anticipated DC Date:  08/21/2013     Status Recommendation to Physician:   Result of Recommendation:    Other ED Services  Consult Working Plan    In-house referral  Clinical Social Worker   DC Associate [facility]lanning Services  Other  PCP issues  Outpatient Services - Pt will follow up    Choice offered to / List presented to:            Status of service:  Completed, signed off  ED Comments:   ED Comments Detail:    Updated ED RN on pt and to watch out for [place] arrival

## 2013-08-21 NOTE — ED Provider Notes (Signed)
CSN: [id number]     Arrival date & time 08/20/13  2214 History   First MD Initiated Contact with Patient 08/21/13 0204     Chief Complaint  Patient presents with  . Leg Pain  . Ankle Pain     (Consider location/radiation/quality/duration/timing/severity/associated sxs/prior Treatment) Patient is a 28 y.o. male presenting with leg pain and ankle pain. The history is provided by the patient. No language interpreter was used.  Leg Pain Associated symptoms: no fever   Ankle Pain Associated symptoms: no fever   Fuller PlanChristopher Henault is a 28 year old male with past pedicle history seizures, asthma, kidney stones, homeless presenting to the ED with left ankle and right knee pain that started after 9:00 p.m. today. Patient reported that the left ankle and right knee pain is described as a constant throbbing sensation without radiation. Stated he's been walking around town a lot today-please as well as leading to discomfort. Denied fall, injury, numbness, tingling, fever, chills, warmth to the touch, color changes, previous injury. PCP none  Past Medical History  Diagnosis Date  . Seizures   . Asthma   . Kidney stone   . Homelessness    History reviewed. No pertinent past surgical history. History reviewed. No pertinent family history. History  Substance Use Topics  . Smoking status: Current Every Day Smoker -- 1.00 packs/day for 3 years    Types: Cigarettes  . Smokeless tobacco: Not on file  . Alcohol Use: No     Comment: occasionally     Review of Systems  Constitutional: Negative for fever and chills.  Respiratory: Negative for chest tightness and shortness of breath.   Cardiovascular: Negative for chest pain.  Musculoskeletal: Positive for arthralgias (Right knee, left ankle).  Skin: Negative for color change.  Neurological: Negative for weakness and numbness.      Allergies  Review of patient's allergies indicates no known allergies.  Home Medications   Prior to  Admission medications   Medication Sig Start Date End Date Taking? Authorizing Provider  divalproex (DEPAKOTE) 250 MG DR tablet Take 250 mg by mouth 3 (three) times daily.   Yes Historical Provider, MD  ferrous fumarate (HEMOCYTE - 106 MG FE) 325 (106 FE) MG TABS tablet Take 1 tablet by mouth every morning.   Yes Historical Provider, MD   BP 106/84  Pulse 107  Temp(Src) 98.7 F (37.1 C) (Oral)  Resp 16  Ht 6\' 6"  (1.981 m)  Wt 240 lb (108.863 kg)  BMI 27.74 kg/m2  SpO2 100% Physical Exam  Nursing note and vitals reviewed. Constitutional: He is oriented to person, place, and time. He appears well-developed and well-nourished. No distress.  HENT:  Head: Normocephalic and atraumatic.  Mouth/Throat: Oropharynx is clear and moist. No oropharyngeal exudate.  Eyes: Conjunctivae and EOM are normal. Pupils are equal, round, and reactive to light. Right eye exhibits no discharge. Left eye exhibits no discharge.  Dilated pupils  Neck: Normal range of motion. Neck supple. No tracheal deviation present.  Negative neck stiffness Negative nuchal rigidity  Negative cervical lymphadenopathy  Negative meningeal signs   Cardiovascular: Normal rate, regular rhythm and normal heart sounds.  Exam reveals no friction rub.   No murmur heard. Pulses:      Radial pulses are 2+ on the right side, and 2+ on the left side.       Dorsalis pedis pulses are 2+ on the right side, and 2+ on the left side.  Pulmonary/Chest: Effort normal and breath sounds normal. No respiratory distress.  He has no wheezes. He has no rales.  Musculoskeletal: Normal range of motion. He exhibits tenderness.       Right knee: He exhibits normal range of motion, no swelling, no effusion, no ecchymosis, no deformity, no laceration, no erythema and normal alignment. Tenderness (Posterior) found.       Legs: Negative swelling, erythema, inflammation, lesions, sores, deformities identified to the right knee. Mild discomfort upon palpation to  the medial aspect and posterior aspect. Negative warmth upon palpation. Full flexion extension identified. Patient is able to bear weight on the right knee. Negative valgus and varus tension. Negative anterior posterior drawer sign. Stable right knee joint. Negative swelling, erythema, inflammation, lesions, sores, deformities identified to the left ankle. Full range of motion to the digits of left foot without difficulty. Full inversion, eversion, plantar flexion, dorsiflexion to the left ankle. Mild discomfort upon palpation to the lateral aspect.  Lymphadenopathy:    He has no cervical adenopathy.  Neurological: He is alert and oriented to person, place, and time. No cranial nerve deficit. He exhibits normal muscle tone. Coordination normal.  Cranial nerves III-XII grossly intact Strength 5+/5+ to lower extremities bilaterally with resistance applied, equal distribution noted Strength intact to the digits of the feet bilaterally  Sensation intact with differentiation to sharp and dull touch  Gait proper, proper balance - negative sway, negative drift, negative step-offs  Skin: Skin is warm and dry. No rash noted. He is not diaphoretic. No erythema.  Psychiatric: He has a normal mood and affect. His behavior is normal. Thought content normal.    ED Course  Procedures (including critical care time) Labs Review Labs Reviewed - No data to display  Imaging Review Dg Chest 2 View  08/19/2013   CLINICAL DATA:  Chest pain and shortness of Breath.  EXAM: CHEST  2 VIEW  COMPARISON:  None.  FINDINGS: The cardiac silhouette, mediastinal and hilar contours are within normal limits the lungs are clear. No pleural effusion. No pneumothorax. The bony thorax is intact.  IMPRESSION: Normal chest x-ray.   Electronically Signed   By: Loralie ChampagneMark  Gallerani M.D.   On: 08/19/2013 19:08   Dg Ankle Complete Left  08/21/2013   CLINICAL DATA:  Left ankle pain beginning 7 hr ago.  No injury.  EXAM: LEFT ANKLE COMPLETE - 3+  VIEW  COMPARISON:  None.  FINDINGS: There is no evidence of fracture, dislocation, or joint effusion. There is no evidence of arthropathy or other focal bone abnormality. Soft tissues are unremarkable.  IMPRESSION: Negative.   Electronically Signed   By: Burman NievesWilliam  Stevens M.D.   On: 08/21/2013 03:24   Dg Knee Complete 4 Views Right  08/21/2013   CLINICAL DATA:  Right knee pain for 7 hr.  No known injury.  EXAM: RIGHT KNEE - COMPLETE 4+ VIEW  COMPARISON:  None.  FINDINGS: There is no evidence of fracture, dislocation, or joint effusion. There is no evidence of arthropathy or other focal bone abnormality. Soft tissues are unremarkable.  IMPRESSION: Negative.   Electronically Signed   By: Burman NievesWilliam  Stevens M.D.   On: 08/21/2013 03:27     EKG Interpretation None      MDM   Final diagnoses:  Arthralgia  Right knee pain  Left ankle pain    Filed Vitals:   08/20/13 2214 08/20/13 2216  BP:  106/84  Pulse:  107  Temp:  98.7 F (37.1 C)  TempSrc:  Oral  Resp:  16  Height:  6\' 6"  (1.981 m)  Weight:  240 lb (108.863 kg)  SpO2: 96% 100%    Plain film of left ankle negative for acute osseous injury-soft tissues are unremarkable. Plain film of right knee negative for acute osseous injury-soft tissues are unremarkable. Negative focal neurological deficits noted. Patient neurovascularly intact-pulses palpable and strong, DP and PT. Sensation intact. Plain films unremarkable for acute osseous injury. Doubt gout. Doubt septic joint. Doubt ischemia. Doubt compartment syndrome. Suspicion to muscular strain. Patient placed in knee sleeve and ankle brace for comfort. Discussed with patient to rest ice and elevate. Referred to orthopedics. Discussed with patient to closely monitor symptoms and if symptoms are to worsen or change to report back to the ED - strict return instructions given.  Patient agreed to plan of care, understood, all questions answered.    Raymon Mutton, PA-C 08/21/13 0401

## 2013-08-21 NOTE — ED Provider Notes (Signed)
CSN: [id number]     Arrival date & time 08/21/13  1329 History   First MD Initiated Contact with Patient 08/21/13 1455     Chief Complaint  Patient presents with  . Abdominal Pain     (Consider location/radiation/quality/duration/timing/severity/associated sxs/prior Treatment) HPI Complains of low bowel pain crampy, suprapubic, nonradiating onset 25 minutes ago. Presently hungry. Last bowel movement this morning, normal. No urinary symptoms no nausea or vomiting. No fever. No treatment prior to coming here. Nothing makes symptoms better or worse. Past Medical History  Diagnosis Date  . Seizures   . Asthma   . Kidney stone   . Homelessness    History reviewed. No pertinent past surgical history. History reviewed. No pertinent family history. History  Substance Use Topics  . Smoking status: Current Every Day Smoker -- 1.00 packs/day for 3 years    Types: Cigarettes  . Smokeless tobacco: Not on file  . Alcohol Use: No     Comment: occasionally    denies tobacco denies alcohol denies drug use. Homeless .  Review of Systems  Constitutional: Negative.   HENT: Negative.   Respiratory: Negative.   Cardiovascular: Negative.   Gastrointestinal: Positive for abdominal pain.  Musculoskeletal: Positive for arthralgias.       Recent ed evaluation for right knee pain and left ankle pain  Skin: Negative.   Neurological: Negative.   Psychiatric/Behavioral: Negative.       Allergies  Review of patient's allergies indicates no known allergies.  Home Medications   Prior to Admission medications   Medication Sig Start Date End Date Taking? Authorizing Provider  cloBAZam (ONFI) 10 MG tablet Take 10 mg by mouth 2 (two) times daily.   Yes Historical Provider, MD  divalproex (DEPAKOTE ER) 250 MG 24 hr tablet Take 500 mg by mouth 3 (three) times daily.   Yes Historical Provider, MD  ferrous fumarate (HEMOCYTE - 106 MG FE) 325 (106 FE) MG TABS tablet Take 1 tablet by mouth every morning.    Yes Historical Provider, MD   BP 122/59  Pulse 54  Temp(Src) 98.7 F (37.1 C) (Oral)  Resp 18  SpO2 95% Physical Exam  Nursing note and vitals reviewed. Constitutional: He appears well-developed and well-nourished.  HENT:  Head: Normocephalic and atraumatic.  Eyes: Conjunctivae are normal. Pupils are equal, round, and reactive to light.  Neck: Neck supple. No tracheal deviation present. No thyromegaly present.  Cardiovascular: Normal rate and regular rhythm.   No murmur heard. Pulmonary/Chest: Effort normal and breath sounds normal.  Abdominal: Soft. Bowel sounds are normal. He exhibits no distension. There is no tenderness.  Genitourinary: Penis normal.  Norma l lale gentialia nio signs of hernis  Musculoskeletal: Normal range of motion. He exhibits no edema and no tenderness.  Wearing Velcro splint on left ankle  Neurological: He is alert. Coordination normal.  Skin: Skin is warm and dry. No rash noted.  Psychiatric: He has a normal mood and affect.    ED Course  Procedures (including critical care time) Labs Review Labs Reviewed  CBC WITH DIFFERENTIAL - Abnormal; Notable for the following:    Hemoglobin 11.4 (*)    HCT 34.3 (*)    MCV 76.2 (*)    MCH 25.3 (*)    All other components within normal limits  URINALYSIS, ROUTINE W REFLEX MICROSCOPIC - Abnormal; Notable for the following:    Bilirubin Urine MODERATE (*)    Ketones, ur 15 (*)    Protein, ur 30 (*)  Leukocytes, UA SMALL (*)    All other components within normal limits  URINE MICROSCOPIC-ADD ON - Abnormal; Notable for the following:    Casts HYALINE CASTS (*)    All other components within normal limits  COMPREHENSIVE METABOLIC PANEL  LIPASE, BLOOD    Imaging Review Dg Chest 2 View  08/19/2013   CLINICAL DATA:  Chest pain and shortness of Breath.  EXAM: CHEST  2 VIEW  COMPARISON:  None.  FINDINGS: The cardiac silhouette, mediastinal and hilar contours are within normal limits the lungs are clear. No  pleural effusion. No pneumothorax. The bony thorax is intact.  IMPRESSION: Normal chest x-ray.   Electronically Signed   By: Loralie Champagne M.D.   On: 08/19/2013 19:08   Dg Ankle Complete Left  08/21/2013   CLINICAL DATA:  Left ankle pain beginning 7 hr ago.  No injury.  EXAM: LEFT ANKLE COMPLETE - 3+ VIEW  COMPARISON:  None.  FINDINGS: There is no evidence of fracture, dislocation, or joint effusion. There is no evidence of arthropathy or other focal bone abnormality. Soft tissues are unremarkable.  IMPRESSION: Negative.   Electronically Signed   By: Burman Nieves M.D.   On: 08/21/2013 03:24   Dg Knee Complete 4 Views Right  08/21/2013   CLINICAL DATA:  Right knee pain for 7 hr.  No known injury.  EXAM: RIGHT KNEE - COMPLETE 4+ VIEW  COMPARISON:  None.  FINDINGS: There is no evidence of fracture, dislocation, or joint effusion. There is no evidence of arthropathy or other focal bone abnormality. Soft tissues are unremarkable.  IMPRESSION: Negative.   Electronically Signed   By: Burman Nieves M.D.   On: 08/21/2013 03:27     EKG Interpretation None     Patient ate a full middle in the emergency department. At 4:30 PM he is asymptomatic., Pain-free. Results for orders placed during the hospital encounter of 08/21/13  CBC WITH DIFFERENTIAL      Result Value Ref Range   WBC 6.7  4.0 - 10.5 K/uL   RBC 4.50  4.22 - 5.81 MIL/uL   Hemoglobin 11.4 (*) 13.0 - 17.0 g/dL   HCT 16.1 (*) 09.6 - 04.5 %   MCV 76.2 (*) 78.0 - 100.0 fL   MCH 25.3 (*) 26.0 - 34.0 pg   MCHC 33.2  30.0 - 36.0 g/dL   RDW 40.9  81.1 - 91.4 %   Platelets 254  150 - 400 K/uL   Neutrophils Relative % 66  43 - 77 %   Neutro Abs 4.4  1.7 - 7.7 K/uL   Lymphocytes Relative 26  12 - 46 %   Lymphs Abs 1.8  0.7 - 4.0 K/uL   Monocytes Relative 8  3 - 12 %   Monocytes Absolute 0.5  0.1 - 1.0 K/uL   Eosinophils Relative 0  0 - 5 %   Eosinophils Absolute 0.0  0.0 - 0.7 K/uL   Basophils Relative 0  0 - 1 %   Basophils Absolute  0.0  0.0 - 0.1 K/uL  COMPREHENSIVE METABOLIC PANEL      Result Value Ref Range   Sodium 135 (*) 137 - 147 mEq/L   Potassium 3.7  3.7 - 5.3 mEq/L   Chloride 96  96 - 112 mEq/L   CO2 22  19 - 32 mEq/L   Glucose, Bld 83  70 - 99 mg/dL   BUN 25 (*) 6 - 23 mg/dL   Creatinine, Ser 7.82 (*) 0.50 -  1.35 mg/dL   Calcium 9.6  8.4 - 62.1 mg/dL   Total Protein 7.2  6.0 - 8.3 g/dL   Albumin 4.3  3.5 - 5.2 g/dL   AST 19  0 - 37 U/L   ALT 21  0 - 53 U/L   Alkaline Phosphatase 62  39 - 117 U/L   Total Bilirubin 0.6  0.3 - 1.2 mg/dL   GFR calc non Af Amer 59 (*) >90 mL/min   GFR calc Af Amer 68 (*) >90 mL/min  LIPASE, BLOOD      Result Value Ref Range   Lipase 35  11 - 59 U/L  URINALYSIS, ROUTINE W REFLEX MICROSCOPIC      Result Value Ref Range   Color, Urine YELLOW  YELLOW   APPearance CLEAR  CLEAR   Specific Gravity, Urine 1.030  1.005 - 1.030   pH 6.0  5.0 - 8.0   Glucose, UA NEGATIVE  NEGATIVE mg/dL   Hgb urine dipstick NEGATIVE  NEGATIVE   Bilirubin Urine MODERATE (*) NEGATIVE   Ketones, ur 15 (*) NEGATIVE mg/dL   Protein, ur 30 (*) NEGATIVE mg/dL   Urobilinogen, UA 1.0  0.0 - 1.0 mg/dL   Nitrite NEGATIVE  NEGATIVE   Leukocytes, UA SMALL (*) NEGATIVE  URINE MICROSCOPIC-ADD ON      Result Value Ref Range   Squamous Epithelial / LPF RARE  RARE   WBC, UA 7-10  <3 WBC/hpf   Bacteria, UA RARE  RARE   Casts HYALINE CASTS (*) NEGATIVE   Urine-Other MUCOUS PRESENT     Dg Chest 2 View  08/19/2013   CLINICAL DATA:  Chest pain and shortness of Breath.  EXAM: CHEST  2 VIEW  COMPARISON:  None.  FINDINGS: The cardiac silhouette, mediastinal and hilar contours are within normal limits the lungs are clear. No pleural effusion. No pneumothorax. The bony thorax is intact.  IMPRESSION: Normal chest x-ray.   Electronically Signed   By: Loralie Champagne M.D.   On: 08/19/2013 19:08   Dg Hip Complete Right  08/16/2013   CLINICAL DATA:  Sudden onset of chest pain radiating down to the right hip and knee.   EXAM: RIGHT HIP - COMPLETE 2+ VIEW  COMPARISON:  None.  FINDINGS: There is no evidence of hip fracture or dislocation. There is no evidence of arthropathy or other focal bone abnormality.  IMPRESSION: Negative.   Electronically Signed   By: Burman Nieves M.D.   On: 08/16/2013 22:14   Dg Ankle Complete Left  08/21/2013   CLINICAL DATA:  Left ankle pain beginning 7 hr ago.  No injury.  EXAM: LEFT ANKLE COMPLETE - 3+ VIEW  COMPARISON:  None.  FINDINGS: There is no evidence of fracture, dislocation, or joint effusion. There is no evidence of arthropathy or other focal bone abnormality. Soft tissues are unremarkable.  IMPRESSION: Negative.   Electronically Signed   By: Burman Nieves M.D.   On: 08/21/2013 03:24   Dg Knee Complete 4 Views Right  08/21/2013   CLINICAL DATA:  Right knee pain for 7 hr.  No known injury.  EXAM: RIGHT KNEE - COMPLETE 4+ VIEW  COMPARISON:  None.  FINDINGS: There is no evidence of fracture, dislocation, or joint effusion. There is no evidence of arthropathy or other focal bone abnormality. Soft tissues are unremarkable.  IMPRESSION: Negative.   Electronically Signed   By: Burman Nieves M.D.   On: 08/21/2013 03:27    MDM  Patient noted to have multiple  ED visits this past week. Patient states his pain is presently due to hunger. We've given him a meal. Child psychotherapistocial worker and case manager asked to evaluate patient Final diagnoses:  None   Child psychotherapistsocial worker and case manager evaluate patient. He was offered to stay in a shelter which he declined.Referral to wellness ctr and resource guide Patient instructed to stay in the cool environment Diagnosis #1abdominal pain-resolved. #2 chronic renal insufficiency       Doug SouSam Jaelin Fackler, MD 08/21/13 [phone]

## 2013-08-21 NOTE — Discharge Instructions (Signed)
Abdominal Pain Stay in a cool environment. Make sure that you drink at least six glasses of water per day. Call the wellness Center or any of the numbers on the resource guide to get a primary care physician Many things can cause belly (abdominal) pain. Most times, the belly pain is not dangerous. Many cases of belly pain can be watched and treated at home. HOME CARE   Do not take medicines that help you go poop (laxatives) unless told to by your doctor.  Only take medicine as told by your doctor.  Eat or drink as told by your doctor. Your doctor will tell you if you should be on a special diet. GET HELP IF:  You do not know what is causing your belly pain.  You have belly pain while you are sick to your stomach (nauseous) or have runny poop (diarrhea).  You have pain while you pee or poop.  Your belly pain wakes you up at night.  You have belly pain that gets worse or better when you eat.  You have belly pain that gets worse when you eat fatty foods.  You have a fever. GET HELP RIGHT AWAY IF:   The pain does not go away within 2 hours.  You keep throwing up (vomiting).  The pain changes and is only in the right or left part of the belly.  You have bloody or tarry looking poop. MAKE SURE YOU:   Understand these instructions.  Will watch your condition.  Will get help right away if you are not doing well or get worse. Document Released: 08/01/2007 Document Revised: 02/17/2013 Document Reviewed: 10/22/2012 [facility] Patient Information 2015 [place], [place]. This information is not intended to replace advice given to you by your health care provider. Make sure you discuss any questions you have with your health care provider.  Emergency Department Resource Guide 1) Find a Doctor and Pay Out of Pocket Although you won't have to find out who is covered by your insurance plan, it is a good idea to ask around and get recommendations. You will then need to call the office and see  if the doctor you have chosen will accept you as a new patient and what types of options they offer for patients who are self-pay. Some doctors offer discounts or will set up payment plans for their patients who do not have insurance, but you will need to ask so you aren't surprised when you get to your appointment.  2) Contact Your Local Health Department Not all health departments have doctors that can see patients for sick visits, but many do, so it is worth a call to see if yours does. If you don't know where your local health department is, you can check in your phone book. The CDC also has a tool to help you locate your state's health department, and many state websites also have listings of all of their local health departments.  3) Find a Walk-in Clinic If your illness is not likely to be very severe or complicated, you may want to try a walk in clinic. These are popping up all over the country in pharmacies, drugstores, and shopping centers. They're usually staffed by nurse practitioners or physician assistants that have been trained to treat common illnesses and complaints. They're usually fairly quick and inexpensive. However, if you have serious medical issues or chronic medical problems, these are probably not your best option.  No Primary Care Doctor: - Call Health Connect at  [phone] -  they can help you locate a primary care doctor that  accepts your insurance, provides certain services, etc. - Physician Referral Service- [phone]  Chronic Pain Problems: Organization         Address  Phone   Notes  [facility]  [phone] Patients need to be referred by their primary care doctor.   Medication Assistance: Organization         Address  Phone   Notes  [facility] Medication [facility] [address] [place], [place] [address] [phone] --Must be a resident of [facility] -- Must have NO insurance coverage whatsoever (no  Medicaid/ Medicare, etc.) -- The pt. MUST have a primary care doctor that directs their care regularly and follows them in the community   [facility]  [phone]   [employer]  [phone]    Agencies that provide inexpensive medical care: Organization         Address  Phone   Notes  [facility]  [phone]   [facility]    [phone]   [facility] [address] [place], [place] [address] [phone]   [facility] [address] [place] [place] [phone]   Planned Parenthood    [phone]   [facility]    [phone]   [facility] and [facility]  [address], [place] Phone:  [phone], Fax:  [phone] Hours of Operation:  9 am - 6 pm, M-F.  Also accepts Medicaid/Medicare and self-pay.  [facility]  [address], [place] Phone: [phone], Fax: [phone]. Hours of Operation:  8:30 am - 5:30 pm, M-F.  Also accepts Medicaid and self-pay.  [facility] [place] [address], [place] Phone: [phone]   [facility] [address] Natasha Bence [place], [place] [phone], Ext. [phone] Mondays & Thursdays: 7-9 AM.  First 15 patients are seen on a first come, first serve basis.    Medicaid-accepting [facility] Providers:  Organization         Address  Phone   Notes  [facility] [address], [place] [phone] Also accepts self-pay patients.  [facility] [address] Laurell Josephs [place], [place]  [phone]   [facility] [address], [place] [phone]   [facility] [address] [place] [phone]   Renaye Rakers [address], [place]   [phone] Only accepts [place] patients after they have their name applied to their card.    Self-Pay (no insurance) in [facility]:  Organization         Address  Phone   Notes  Sickle Cell Patients, [facility] [address] [place], [place] [phone]   [facility] [address] [place], [place] [phone]   [facility] [place]  [address], [place] [phone]   [facility]/Dr. Osei-Bonsu  [address], [place] or [address], [place] [phone] Phone number for both [place] and [place] locations is the same.  Urgent Medical and [facility] [address], [place] [phone]   [facility] [address], [place] or [place]  [facility] [address] [phone]   [facility] [address] [place], [place] [phone], phone; [phone], fax Sees patients 1st and 3rd Saturday of every month.  Must not qualify for public or private insurance (i.e. Medicaid, Medicare, [place] Health Choice, Veterans' Benefits)  Household income should be no more than 200% of the poverty level The clinic cannot treat you if you are pregnant or think you are pregnant  Sexually transmitted diseases are not treated at the clinic.    Dental Care: Organization         Address  Phone  Notes  [facility] [address], [place] [phone] Accepts children up to age 521 who are enrolled in [place]icaid or [place] Health Choice; pregnant women with a Medicaid card; and children who have applied for Medicaid or [place] Health Choice, but were declined, whose parents can pay a reduced fee at time of service.  [facility] [place]  [address], [place] [phone] Accepts children up to age 26 who are enrolled in [place] or [place] Health Choice; pregnant women with a Medicaid card; and children who have applied for Medicaid or  Health Choice,  but were declined, whose parents can pay a reduced fee at time of service.  [facility]  [address], [place] [phone] Patients are seen by appointment only. Walk-ins are not accepted. [facility] will see patients 26 years of age and older. Monday - Tuesday (8am-5pm) Most Wednesdays (8:30-5pm) $30 per visit, cash only  [facility]  [address], [facility] [phone] Patients are seen by appointment only. Walk-ins are not accepted. [facility] will see patients 26 years of age and older. One Wednesday Evening (Monthly: Volunteer Based).  $30 per visit, cash only  [employer] [facility] [facility]  [phone] for adults; Children under age 364, call Graduate Pediatric Dentistry at [phone]. Children aged 34-14, please call [phone] to request a pediatric application.  Dental services are provided in all areas of dental care including fillings, crowns and bridges, complete and partial dentures, implants, gum treatment, root canals, and extractions. Preventive care is also provided. Treatment is provided to both adults and children. Patients are selected via a lottery and there is often a waiting list.   [facility] [address], [place]  [phone] [email or web address]   [facility] [address], [place], [place] [phone], Ext. [phone] Second and Fourth Thursday of each month, opens at 6:30 AM; Clinic ends at 9 AM.  Patients are seen on a first-come first-served basis, and a limited number are seen during each clinic.   [facility]  [address], [place], [place] [phone]) [phone]   Eligibility Requirements You must have lived in [place], [place], or [place] for at least the last three months.   You cannot be eligible for state or federal sponsored [place] Cityhealthcare insurance, including CIGNAVeterans Administration, [place]icaid, or [employer]Medicare.   You generally  cannot be eligible for healthcare insurance through your employer.    How to apply: Eligibility screenings are held every Tuesday and Wednesday afternoon from 1:00 pm until 4:00 pm. You do not need an appointment for the interview!  [facility] [address], [place], [place] [phone]   [facility]  [phone]   [facility]  [phone]   [facility]  [phone]    Behavioral Health Resources in the Community: Intensive Outpatient Programs Organization         Address  Phone  Notes  [facility] [address], [place], [place] [phone]   [facility] [address], [place], [place] [phone]   [facility] [address], [place], [place]  [phone]   [facility] [address],  [place], [place] [phone] or [phone]   Substance Abuse Resources Organization         Address  Phone  Notes  [facility]  [phone]   [facility]  [phone]   [facility]  [phone]   [facility]  [phone]   Residential & Outpatient Substance Abuse Program  [phone]   Psychological Services Organization         Address  Phone  Notes  [facility]  [phone]   [facility]  [phone]   [facility] [address], [place] [phone] or [phone]    Mobile Crisis Teams Organization         Address  Phone  Notes  [facility], Mobile Crisis Care Unit  [phone]   [facility]  [address] [place], [place] [phone]   Doristine LocksSharon DeEsch [address] [place] [phone]    Self-Help/Support Groups Organization         Address  Phone             Notes  [facility] [place] - variety of support groups  [phone] Call for more  information  Narcotics Anonymous (NA), [facility] [address], [place]  2 meetings at this location   Statisticianesidential Treatment Programs Organization         Address  Phone  Notes  [facility] [address],    [place]  [phone]   [facility]  [address], [place] [phone], [place], [place] [phone]   [facility] [address], [place] [phone] Admissions: 8am-3pm M-F  [facility] [address]    [place], [place] [phone]   [facility] [address], [place], [place] [phone]   [facility] [address]  [place], [place] [phone]   [facility] - Intensive Outpatient [address], [place], [place] [phone]   [facility] ([facility] [address]  [place], [place] [phone] or [phone]   [facility] (RTS) [address] [place], [place] [phone] Accepts Medicaid  [facility] [address]  [place] [phone] Substance Abuse/Addiction Treatment   [facility] Resources Organization         Address  Phone  Notes  [facility]  [phone]) [phone]   Angie FavaJulie Brannon, PhD [address], [address] [place], [place]   [phone] or [phone]   [facility]   [address] [place], [place] [phone]   [facility] [address], [place], [place] [phone]) [phone] Insurance/Medicaid/sponsorship through [employer] [facility]  [facility] Families [address]  [place], [place] [phone] Therapy/tele-psych/case  °[facility] [address]  ° [place], [place] [phone]    °Dr. Arfeen  [phone]   °[facility] [place]  [employer] [place] [facility] 1) [address], [place] °2) [address], [place] °3)  [address], [place] ([phone]  [phone] °([phone]) [phone] ° °([phone]) [phone]   °[place] Child Abuse Hotline (336) 342-1394 or [phone]) [phone] (After Hours)    ° ° °

## 2013-08-21 NOTE — ED Notes (Signed)
Pt was called earlier to be placed in room and did not answer.  Called again at this time and found pt sleeping in wheelchair.

## 2013-08-21 NOTE — ED Notes (Signed)
Case management at bedside.

## 2013-08-21 NOTE — ED Notes (Addendum)
Pt complains of generalized abd pain that started "20" minutes ago. Denies n/v/d.

## 2013-08-23 ENCOUNTER — Encounter (HOSPITAL_COMMUNITY): Payer: Self-pay | Admitting: Emergency Medicine

## 2013-08-23 ENCOUNTER — Emergency Department (HOSPITAL_COMMUNITY): Payer: Medicaid Other

## 2013-08-23 ENCOUNTER — Emergency Department (HOSPITAL_COMMUNITY)
Admission: EM | Admit: 2013-08-23 | Discharge: 2013-08-23 | Disposition: A | Discharge status: Home / Self Care | Payer: Medicaid Other | Attending: Emergency Medicine | Admitting: Emergency Medicine

## 2013-08-23 DIAGNOSIS — R079 Chest pain, unspecified: Secondary | ICD-10-CM | POA: Insufficient documentation

## 2013-08-23 DIAGNOSIS — F17200 Nicotine dependence, unspecified, uncomplicated: Secondary | ICD-10-CM | POA: Insufficient documentation

## 2013-08-23 DIAGNOSIS — Z79899 Other long term (current) drug therapy: Secondary | ICD-10-CM | POA: Insufficient documentation

## 2013-08-23 DIAGNOSIS — Z5900 Homelessness unspecified: Secondary | ICD-10-CM | POA: Insufficient documentation

## 2013-08-23 DIAGNOSIS — G40909 Epilepsy, unspecified, not intractable, without status epilepticus: Secondary | ICD-10-CM | POA: Insufficient documentation

## 2013-08-23 DIAGNOSIS — J45909 Unspecified asthma, uncomplicated: Secondary | ICD-10-CM | POA: Insufficient documentation

## 2013-08-23 LAB — CBC
HCT: 34.3 % — ABNORMAL LOW (ref 39.0–52.0)
HEMOGLOBIN: 11.1 g/dL — AB (ref 13.0–17.0)
MCH: 24.8 pg — ABNORMAL LOW (ref 26.0–34.0)
MCHC: 32.4 g/dL (ref 30.0–36.0)
MCV: 76.6 fL — ABNORMAL LOW (ref 78.0–100.0)
Platelets: 261 10*3/uL (ref 150–400)
RBC: 4.48 MIL/uL (ref 4.22–5.81)
RDW: 13.4 % (ref 11.5–15.5)
WBC: 9.1 10*3/uL (ref 4.0–10.5)

## 2013-08-23 LAB — BASIC METABOLIC PANEL
BUN: 23 mg/dL (ref 6–23)
CO2: 24 mEq/L (ref 19–32)
Calcium: 9.8 mg/dL (ref 8.4–10.5)
Chloride: 101 mEq/L (ref 96–112)
Creatinine, Ser: 1.56 mg/dL — ABNORMAL HIGH (ref 0.50–1.35)
GFR calc Af Amer: 70 mL/min — ABNORMAL LOW (ref >90)
GFR, EST NON AFRICAN AMERICAN: 60 mL/min — AB (ref >90)
GLUCOSE: 85 mg/dL (ref 70–99)
POTASSIUM: 3.8 meq/L (ref 3.7–5.3)
SODIUM: 142 meq/L (ref 137–147)

## 2013-08-23 LAB — I-STAT TROPONIN, ED: TROPONIN I, POC: 0 ng/mL (ref 0.00–0.08)

## 2013-08-23 NOTE — Discharge Instructions (Signed)

## 2013-08-23 NOTE — ED Notes (Signed)
Pt states he began having chest pain at approx 1900 tonight that radiates to right shouldler. . Points to center of chest.  Pushing on chest originally caused pain to increase.  Pain now remains unchanged. Rates pain 8/10.

## 2013-08-23 NOTE — ED Provider Notes (Signed)
CSN: [id number]     Arrival date & time 08/23/13  1948 History   First MD Initiated Contact with Patient 08/23/13 2001     Chief Complaint  Patient presents with  . Chest Pain     (Consider location/radiation/quality/duration/timing/severity/associated sxs/prior Treatment) HPI Comments: Pt presents with chest pain.  He says that it is sore all across his upper chest.  Worse with movement.  +SOB.  No leg swelling.  No cough or congestion.  +feet pain from walking all day.  No fevers.  No n/v/d.  No hx of heart problems.  Pt is homeless and has been here multiple times this month for chest pain.  Has had multiple negative troponins, neg d-dimer, neg CXR.     Past Medical History  Diagnosis Date  . Seizures   . Asthma   . Kidney stone   . Homelessness    History reviewed. No pertinent past surgical history. History reviewed. No pertinent family history. History  Substance Use Topics  . Smoking status: Current Every Day Smoker -- 0.20 packs/day for 3 years    Types: Cigarettes  . Smokeless tobacco: Not on file  . Alcohol Use: No     Comment: occasionally     Review of Systems  Constitutional: Negative for fever, chills, diaphoresis and fatigue.  HENT: Negative for congestion, rhinorrhea and sneezing.   Eyes: Negative.   Respiratory: Positive for shortness of breath. Negative for cough and chest tightness.   Cardiovascular: Positive for chest pain. Negative for leg swelling.  Gastrointestinal: Negative for nausea, vomiting, abdominal pain, diarrhea and blood in stool.  Genitourinary: Negative for frequency, hematuria, flank pain and difficulty urinating.  Musculoskeletal: Positive for arthralgias. Negative for back pain.  Skin: Negative for rash.  Neurological: Negative for dizziness, speech difficulty, weakness, numbness and headaches.      Allergies  Review of patient's allergies indicates no known allergies.  Home Medications   Prior to Admission medications    Medication Sig Start Date End Date Taking? Authorizing Provider  cloBAZam (ONFI) 10 MG tablet Take 10 mg by mouth 2 (two) times daily.   Yes Historical Provider, MD  divalproex (DEPAKOTE ER) 250 MG 24 hr tablet Take 500 mg by mouth 3 (three) times daily.   Yes Historical Provider, MD  ferrous fumarate (HEMOCYTE - 106 MG FE) 325 (106 FE) MG TABS tablet Take 1 tablet by mouth every morning.   Yes Historical Provider, MD   BP 120/70  Pulse 87  Temp(Src) 99 F (37.2 C) (Oral)  Resp 14  Ht 6\' 6"  (1.981 m)  Wt 240 lb (108.863 kg)  BMI 27.74 kg/m2  SpO2 99% Physical Exam  Constitutional: He is oriented to person, place, and time. He appears well-developed and well-nourished.  HENT:  Head: Normocephalic and atraumatic.  Eyes: Pupils are equal, round, and reactive to light.  Neck: Normal range of motion. Neck supple.  Cardiovascular: Normal rate, regular rhythm and normal heart sounds.   Pulmonary/Chest: Effort normal and breath sounds normal. No respiratory distress. He has no wheezes. He has no rales. He exhibits tenderness (across upper chest).  Abdominal: Soft. Bowel sounds are normal. There is no tenderness. There is no rebound and no guarding.  Musculoskeletal: Normal range of motion. He exhibits no edema.  Pt with increased moisture to bottom of his feet, some blisters, no signs of infection.  Lymphadenopathy:    He has no cervical adenopathy.  Neurological: He is alert and oriented to person, place, and time.  Skin:  Skin is warm and dry. No rash noted.  Psychiatric: He has a normal mood and affect.    ED Course  Procedures (including critical care time) Labs Review Results for orders placed during the hospital encounter of 08/23/13  BASIC METABOLIC PANEL      Result Value Ref Range   Sodium 142  137 - 147 mEq/L   Potassium 3.8  3.7 - 5.3 mEq/L   Chloride 101  96 - 112 mEq/L   CO2 24  19 - 32 mEq/L   Glucose, Bld 85  70 - 99 mg/dL   BUN 23  6 - 23 mg/dL   Creatinine, Ser  1.61 (*) 0.50 - 1.35 mg/dL   Calcium 9.8  8.4 - 09.6 mg/dL   GFR calc non Af Amer 60 (*) >90 mL/min   GFR calc Af Amer 70 (*) >90 mL/min  CBC      Result Value Ref Range   WBC 9.1  4.0 - 10.5 K/uL   RBC 4.48  4.22 - 5.81 MIL/uL   Hemoglobin 11.1 (*) 13.0 - 17.0 g/dL   HCT 04.5 (*) 40.9 - 81.1 %   MCV 76.6 (*) 78.0 - 100.0 fL   MCH 24.8 (*) 26.0 - 34.0 pg   MCHC 32.4  30.0 - 36.0 g/dL   RDW 91.4  78.2 - 95.6 %   Platelets 261  150 - 400 K/uL  I-STAT TROPOININ, ED      Result Value Ref Range   Troponin i, poc 0.00  0.00 - 0.08 ng/mL   Comment 3            Dg Chest 2 View  08/19/2013   CLINICAL DATA:  Chest pain and shortness of Breath.  EXAM: CHEST  2 VIEW  COMPARISON:  None.  FINDINGS: The cardiac silhouette, mediastinal and hilar contours are within normal limits the lungs are clear. No pleural effusion. No pneumothorax. The bony thorax is intact.  IMPRESSION: Normal chest x-ray.   Electronically Signed   By: Loralie Champagne M.D.   On: 08/19/2013 19:08   Dg Hip Complete Right  08/16/2013   CLINICAL DATA:  Sudden onset of chest pain radiating down to the right hip and knee.  EXAM: RIGHT HIP - COMPLETE 2+ VIEW  COMPARISON:  None.  FINDINGS: There is no evidence of hip fracture or dislocation. There is no evidence of arthropathy or other focal bone abnormality.  IMPRESSION: Negative.   Electronically Signed   By: Burman Nieves M.D.   On: 08/16/2013 22:14   Dg Ankle Complete Left  08/21/2013   CLINICAL DATA:  Left ankle pain beginning 7 hr ago.  No injury.  EXAM: LEFT ANKLE COMPLETE - 3+ VIEW  COMPARISON:  None.  FINDINGS: There is no evidence of fracture, dislocation, or joint effusion. There is no evidence of arthropathy or other focal bone abnormality. Soft tissues are unremarkable.  IMPRESSION: Negative.   Electronically Signed   By: Burman Nieves M.D.   On: 08/21/2013 03:24   Dg Chest Port 1 View  08/23/2013   CLINICAL DATA:  Left-sided chest pain and shortness of breath.  EXAM:  PORTABLE CHEST - 1 VIEW  COMPARISON:  08/19/2013  FINDINGS: The cardiomediastinal silhouette is within normal limits. The lungs are well inflated and clear. There is no evidence of pleural effusion or pneumothorax. No acute osseous abnormality is identified.  IMPRESSION: No active disease.   Electronically Signed   By: Sebastian Ache   On: 08/23/2013 21:18  Dg Knee Complete 4 Views Right  08/21/2013   CLINICAL DATA:  Right knee pain for 7 hr.  No known injury.  EXAM: RIGHT KNEE - COMPLETE 4+ VIEW  COMPARISON:  None.  FINDINGS: There is no evidence of fracture, dislocation, or joint effusion. There is no evidence of arthropathy or other focal bone abnormality. Soft tissues are unremarkable.  IMPRESSION: Negative.   Electronically Signed   By: Burman NievesWilliam  Stevens M.D.   On: 08/21/2013 03:27      Imaging Review Dg Chest Port 1 View  08/23/2013   CLINICAL DATA:  Left-sided chest pain and shortness of breath.  EXAM: PORTABLE CHEST - 1 VIEW  COMPARISON:  08/19/2013  FINDINGS: The cardiomediastinal silhouette is within normal limits. The lungs are well inflated and clear. There is no evidence of pleural effusion or pneumothorax. No acute osseous abnormality is identified.  IMPRESSION: No active disease.   Electronically Signed   By: Sebastian AcheAllen  Grady   On: 08/23/2013 21:18     EKG Interpretation   Date/Time:  Sunday August 23 2013 19:54:40 EDT Ventricular Rate:  97 PR Interval:  130 QRS Duration: 78 QT Interval:  343 QTC Calculation: 436 R Axis:   58 Text Interpretation:  Sinus rhythm Inferior infarct, acute (LCx) ST  elevation, consider anterior injury Lateral leads are also involved since  last tracing no significant change Confirmed by BELFI  MD, MELANIE ([id number])  on 08/23/2013 7:59:34 PM      MDM   Final diagnoses:  Chest pain, unspecified chest pain type    Labs done from triage unremarkable.  Trop neg, CXR neg.  Low suspicion for ACS, PE.  Pt discharged, given information about [place] and  wellness center.  Creatinine at baseline.  Given extra socks to change to keep feet dry.    Rolan BuccoMelanie Belfi, MD 08/23/13 [phone]

## 2014-06-18 NOTE — Consult Note (Signed)
Details:   - PSychiatry: PAtient seen for follow up. Affect calm. Patient lucid. Denies any suicidal thought and says living for his daughter is one important thing he has to live for.Not clearly having a MDE. Not commitment criteria. Supportive therapy done. Urge him to get outpt follow up treatment and he agrees. Asked care management to [facility] by calling [phone]. DC the sitter.   Electronic Signatures: Audery Amellapacs, John T (MD)  (Signed 01-Jul-14 15:47)  Authored: Details   Last Updated: 01-Jul-14 15:47 by Audery Amellapacs, John T (MD)

## 2014-06-18 NOTE — Discharge Summary (Signed)
PATIENT NAME:  Justin Logan, Justin MR#:  [id number] DATE OF BIRTH:  08-Dec-1985  DATE OF ADMISSION:  08/23/2012 DATE OF DISCHARGE:  08/26/2012  PRESENTING COMPLAINT: Unresponsive, intentional overdose.   DISCHARGE DIAGNOSES:  1.  Intentional overdose with nortriptyline, Ultram and possibly with Relpax.  2.  Depression/anxiety.  3.  History of scoliosis.   CODE STATUS: Full code.   MEDICATIONS:  1.  Relpax 20 mg 1 tablet at onset of rapid migraine as needed.  2.  Tylenol 500 mg 2 tablets q.6 hourly as needed.  3.  Follow up with her psychiatrist in [place].  4.  Follow up with your [facility] care [facility] at [facility].   CONSULTATION:  1.  Pulmonary consultation with Dr. Meredeth IdeFleming.  2.  Psychiatry consultation with Dr. Toni Amendlapacs.   LABS AT DISCHARGE: Magnesium is 2.4, glucose is 107, BUN 4, creatinine 0.7, sodium 140,  potassium 3.7, chloride 108. Phosphorus 2.6. CBC within normal limits. PT-INR is 14.4 and 1.1. CT of the head showed no acute abnormality.  BRIEF SUMMARY OF HOSPITAL COURSE: Justin Logan is a 29 year old Caucasian gentleman with history of having migraines, scoliosis and depression. He presented to the ER with:  1.  Acute encephalopathy/altered mental status, suspected from intentional overdose with tricyclic antidepressant, which is nortriptyline, Tramadol and Relpax, unknown quantity, presumed a fairly large amount. The patient was intubated and placed on the vent for airway protection. He had a GCS of 6 on arrival to the Emergency Room. He was seen by Dr. Meredeth IdeFleming. He was extubated in 24 to 36 hours and was doing well. Chest x-ray remained stable.  2.  Tricyclic antidepressant toxicity with widening of QRS interval. The patient's QRS is 128, came down to 116. His EKG was monitored frequently given he received a few IV bicarb amps. Poison Control was informed and recommendations were followed.  3.  Depression with intentional suicidal ideation. Psychiatric  consultation was done with Dr. Toni Amendlapacs. Dr. Toni Amendlapacs felt the patient was okay to be discharged to home. Discussed with patient regarding outpatient psychiatry followup. He reports he is going to return back to [place] with his mother and has outpatient psych follow-ups.  4.  DVT prophylaxis. Subcutaneous heparin. Hospital stay otherwise remained stable. The patient remained a full code.   TIME SPENT: 40 minutes.  ____________________________ Wylie HailSona A. Allena KatzPatel, MD sap:aw D: 08/27/2012 06:46:45 ET T: 08/27/2012 07:04:48 ET JOB#: [id number]  cc: Patrina Andreas A. Allena KatzPatel, MD, <Dictator> Willow OraSONA A Lewanda Perea MD ELECTRONICALLY SIGNED 09/15/2012 18:54

## 2014-06-18 NOTE — Consult Note (Signed)
Brief Consult Note: Diagnosis: Depression not otherwise specified.   Patient was seen by consultant.   Consult note dictated.   Recommend further assessment or treatment.   Comments: Psychiatry: Patient seen and chart reviewed. This young man presented after taking an intentional overdose of amitriptyline and Ultram. He reports having been under a lot of "stress" recently related to his job, financial problems and his relationship. On presentation today he is awake alert and appropriately interactive and appears to be forthcoming. He completely denies any current suicidal intent or plan. Continues to feel anxious and stressed. No evidence of psychosis. No previous psychiatric history. No evidence of substance abuse.  After one evaluation it's not clear whether he has a major depression. I have deferred starting medication. I have discussed treatment options with the patient and his girlfriend. I think that the sitter can safely be discontinued. I will followup tomorrow. I note that the patient is not under involuntary commitment.  Electronic Signatures: Audery Amellapacs, John T (MD)  (Signed 30-Jun-14 21:25)  Authored: Brief Consult Note   Last Updated: 30-Jun-14 21:25 by Audery Amellapacs, John T (MD)

## 2014-06-18 NOTE — H&P (Signed)
PATIENT NAME:  Justin Logan, Justin Logan MR#:  [id number] DATE OF BIRTH:  09/22/1985  DATE OF ADMISSION:  08/23/2012  PRIMARY CARE PHYSICIAN:  Dr. Phineas Realharles [facility]    REFERRING PHYSICIAN:  Dr. Daryel NovemberJonathan Williams  CHIEF COMPLAINT:  Unarousable.   HISTORY OF PRESENT ILLNESS: The patient is a 29 year old Caucasian male who is currently intubated. In the room is a girlfriend of 3 years, who the patient lives with, in addition to girlfriend's father. 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 He is originally from [place], and moved here to be with the girlfriend. The patient's family is mostly in [place]. Information was obtained from patient's girlfriend and her father, in addition to the ER staff. Also some information was obtained from the patient's mother, who was called in [place], phone number is [phone]. Apparently, patient has had increased stress at work recently for working more at the [employer] where he is employed. Furthermore, recently the patient has had a possible relationship with another girl, and was found out. There was some discussion with the girlfriend and her father, and apparently there is question of possibly breaking up, etc. He has depression, scoliosis and migraines, and he likely took an unknown quantity of nortriptyline as well as Tramadol, in addition to possibly taking Relpax. Apparently, he made some food for the girlfriend, and then texted his family members, saying I love you, which he traditionally does not do. The text went about 3:30 or 3:40 p.m., and per girlfriend's father, he possibly took the medication at 4:00. The medications were filled on June 17, and he likely took the remainder of the bottle, as both bottles of tramadol and amitriptyline are empty. He was found down and unarousable by girlfriend. EMS was called, he was brought in here and promptly intubated for GCS of 6. He was given bicarb x 2 as well as charcoal 100 mg. His  U-tox is positive for tricyclics. Initial EKGs also showed nonspecific interventricular blocks and rabbit ears on V1, V2, possibly right bundle branch, with wide QRS and T-wave abnormalities. Poison control has been called, and they are on board and know about the patient. Currently he is intubated and unable to provide any history. His blood pressure is holding. He does not have a fever, and he is not hypoxic on the vent. Hospitalist services were contacted for further evaluation and management.   PAST MEDICAL HISTORY:  Per girlfriend, scoliosis, migraines, depression years ago, and possible suicide attempt while depressed some time ago, which the mother was not aware of.   MEDICATIONS:  Nortriptyline 25 mg at bedtime, last refill 08/12/2012. Relpax 20 mg at onset of migraine, repeat in 2 hours if needed. Tramadol 50 mg 1 to 2 tabs every 6 hours as needed for pain, last refilled on June 17 as well.   PAST SURGICAL HISTORY:  None.   REVIEW OF SYSTEMS:  Unable to obtain, patient is intubated.   ALLERGIES:  SHELLFISH.  FAMILY HISTORY: There is family history of heart issues and heart attack, per mother, and the girlfriend did not know.   PHYSICAL EXAMINATION: VITAL SIGNS: Temperature on arrival 95.6, and he is on a heating blanket now, pulse rate on arrival 104, respiratory rate 6, initial blood pressure 137/87. Last blood pressure was 125/76, with a heart rate of 105, respiratory rate 14 on the vent, 100% oxygenation. HEENT: Normocephalic, atraumatic. Pupils are equal, sluggish but reactive. The patient is orally intubated with an O-G tube.  NECK:  Supple. No thyroid tenderness, no cervical lymphadenopathy.  CARDIOVASCULAR: S1, S2. Tachycardic. No murmurs, rubs or gallops.  LUNGS:  Clear to auscultation. No wheezing, rhonchi or rales. Good air entry bilaterally.  ABDOMEN:  Soft, nontender, nondistended. Positive bowel sounds in all quadrants.  EXTREMITIES:  No significant lower extremity edema.   SKIN:  There  appears to be no rash on examination of the skin. NEUROLOGIC:  Unable to do neuro exam, as patient is intubated, sedated.  PSYCHIATRIC:  Unable to do, given patient is intubated, sedated.   LABS:  U-tox positive for tricyclics. WBC 11.3, hemoglobin 15.3, hematocrit 44, platelets 196. UA not suggestive of infection. Serum acetaminophen less than 2, serum salicylates less than 1.7. ABG showed a pH of 7.42, pCO2 of 45, pO2 of 352, on the vent. Troponin negative. LFTs within normal limits. Alcohol level below detection. Anion gap is 3, otherwise BMP within normal limits.   EKGs as above. CT of the head without contrast:  Normal noncontrast CT of the head. X-ray of the chest, 1 view, showing advancement of trachea by approximately 2 cm is recommended to avoid accidental extubation, and that was advanced already. No evidence of pneumonia, atelectasis, nor CHF. No pleural effusion, no pneumothorax.   ASSESSMENT AND PLAN: We have a 29 year old Caucasian male with depression, possible suicide attempt in the past, migraines, with likely suicide attempt with intentional ingestion of unknown quantity, possibly at least 19 to 20 days' worth of dosages of nortriptyline and Ultram, as the bottles were empty, in addition to Relpax, with altered mental status, found down unarousable with TCA cardiac toxicity signs. Currently intubated. The patient will be admitted to the CCU on the vent. We would obtain a pulmonary consult. ABG appears to be compensated, and would manage the vent. He has good oxygenation on the vent. In regards to the TCA cardiac toxicity including QRS prolongations, nonspecific T-wave changes, and possible right bundle branch, he has received 2 amps of bicarb, and I discussed the case with poison control as well as ER physician. I will give another 2 of bicarb and recheck an EKG in about an hour or an hour and a half, and look for the resolution of those signs. If not, we might have to give  more bicarb. I would check EKGs more frequently until the conduction delays resolve. The patient is at high risk of having arrhythmias, including lethal V. tach/V. fib. His U-tox is positive for TCA, but it is unclear what else he took. However, I discussed with the girlfriend, she takes Zoloft, and her pills were okay. As far as we know, the above medications are all he took. Acetaminophen and salicylate levels were not elevated, nor alcohol. Would add a sitter, and a psych consult should be made once patient is extubated. He was started on heparin for DVT prophylaxis. Would monitor his vitals carefully in the ICU. The patient is a full code.   Total time spent is 70 minutes on the case. The patient is critically ill, at high risk of cardiopulmonary complications and cardiac arrest.    ____________________________ Krystal Eaton, MD sa:mr D: 08/23/2012 21:13:18 ET T: 08/23/2012 21:47:24 ET JOB#: [id number]  cc: Krystal Eaton, MD, <Dictator> Krystal Eaton MD ELECTRONICALLY SIGNED 09/22/2012 14:30

## 2015-05-02 ENCOUNTER — Telehealth (INDEPENDENT_AMBULATORY_CARE_PROVIDER_SITE_OTHER): Payer: Self-pay

## 2015-05-02 NOTE — Telephone Encounter (Signed)
New Pt scheduled for 07/12/15 with Dr J.Ma is requesting an appt in order to ge a referral to psych/collabrative care for anxiety. 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     Pt was given the number to OP/Psych [phone]

## 2015-05-03 NOTE — Telephone Encounter (Signed)
Placed call to pt regarding symptoms. Left voice message for patient with a call back number for the clinic. Informed pt to call if patient would like to schedule an appointment or talk to the triage nurse.

## 2015-05-04 NOTE — Telephone Encounter (Signed)
Second attempt to contact pt by telephone. Left voice message for patient with a call back number for the clinic.

## 2015-05-05 NOTE — Telephone Encounter (Signed)
Third attempt to contact pt by telephone. Left voice message for patient with a call back number for the clinic. Informed pt to call if patient would like to schedule an appointment or talk to the triage nurse.   Pt has not yet established care with a PCP. Routing to Dr. Ileene RubensSani for FYI.

## 2015-07-03 ENCOUNTER — Inpatient Hospital Stay
Admit: 2015-07-03 | Discharge: 2015-07-03 | Disposition: A | Discharge status: Home / Self Care | Payer: MEDICAID | Attending: Emergency Medicine

## 2015-07-03 DIAGNOSIS — G40309 Generalized idiopathic epilepsy and epileptic syndromes, not intractable, without status epilepticus: Secondary | ICD-10-CM

## 2015-07-03 MED ORDER — DIVALPROEX 500 MG TAB, DELAYED RELEASE
500 mg | ORAL | Status: AC
Start: 2015-07-03 — End: 2015-07-03
  Administered 2015-07-03: 08:00:00 via ORAL

## 2015-07-03 MED ORDER — DIVALPROEX 250 MG TAB, DELAYED RELEASE
250 mg | ORAL_TABLET | Freq: Every evening | ORAL | 0 refills | Status: AC
Start: 2015-07-03 — End: 2015-07-13

## 2015-07-03 MED ORDER — VALPROIC ACID 250 MG CAP
250 mg | Freq: Three times a day (TID) | ORAL | Status: DC
Start: 2015-07-03 — End: 2015-07-03

## 2015-07-03 MED ORDER — VALPROIC ACID 250 MG CAP
250 mg | ORAL_CAPSULE | Freq: Every day | ORAL | 0 refills | Status: AC
Start: 2015-07-03 — End: 2015-07-13

## 2015-07-03 MED ORDER — DIVALPROEX 500 MG TAB, DELAYED RELEASE
500 mg | ORAL | Status: AC
Start: 2015-07-03 — End: 2015-07-03

## 2015-07-03 MED FILL — DIVALPROEX 500 MG TAB, DELAYED RELEASE: 500 mg | ORAL | Qty: 1

## 2015-07-03 MED FILL — VALPROIC ACID 250 MG CAP: 250 mg | ORAL | Qty: 2

## 2015-07-03 NOTE — ED Notes (Signed)
Pt presents to ED ambulatory with complaint(s) of needing a dose of Depakote because pt reports he has been out of his medication for the past 5 days. Pt reports he takes Depakote 500mg  in the morning and 250mg  in the evening. Pt denies any headache, dizziness, nausea, vomiting, or diarrhea. Pt is alert and oriented x4 and in no apparent distress.       Emergency Department Nursing Plan of Care       The Nursing Plan of Care is developed from the Nursing assessment and Emergency Department Attending provider initial evaluation.  The plan of care may be reviewed in the ???ED Provider note???.    The Plan of Care was developed with the following considerations:   Patient / Family readiness to learn indicated ZO:[id number]:verbalized understanding  Persons(s) to be included in education: patient  Barriers to Learning/Limitations:No    Signed     Philemon KingdomSherry E Myers, RN    07/03/2015   3:32 AM

## 2015-07-03 NOTE — ED Provider Notes (Addendum)
HPI Comments: Fuller PlanChristopher Prince is a 30 y.o. male with PMHx significant for seizures, who presents ambulatory to the ED for medication refill. He states that he has been out of his Depakote for 5 days. Pt reports that he usually takes 500 mg in the morning and 250 mg at night. He notes that he has been taking this medication for years. He denies CP,SOB,F/C,N/V/D.    PCP: Phys Other, MD    Social History: smoking (occasional) EtOH(occasional) illicit drug (-)      There are no other complaints, changes, or physical findings at this time.       The history is provided by the patient.        No past medical history on file.    No past surgical history on file.      No family history on file.    Social History     Social History   ??? Marital status: SINGLE     Spouse name: N/A   ??? Number of children: N/A   ??? Years of education: N/A     Occupational History   ??? Not on file.     Social History Main Topics   ??? Smoking status: Not on file   ??? Smokeless tobacco: Not on file   ??? Alcohol use Not on file   ??? Drug use: Not on file   ??? Sexual activity: Not on file     Other Topics Concern   ??? Not on file     Social History Narrative         ALLERGIES: Review of patient's allergies indicates no known allergies.    Review of Systems   Constitutional: Negative for appetite change, chills and fever.   HENT: Negative for congestion.    Eyes: Negative for visual disturbance.   Respiratory: Negative for cough, shortness of breath and wheezing.    Cardiovascular: Negative for chest pain, palpitations and leg swelling.   Gastrointestinal: Negative for abdominal pain.   Genitourinary: Negative for dysuria, frequency and urgency.   Musculoskeletal: Negative for back pain, joint swelling, myalgias and neck stiffness.   Skin: Negative for rash.   Neurological: Negative for dizziness, syncope, weakness and headaches.   Hematological: Negative for adenopathy.   Psychiatric/Behavioral: Negative for behavioral problems and dysphoric mood.        Vitals:    07/03/15 0326   BP: 121/78   Pulse: 77   Resp: 16   Temp: 97.9 ??F (36.6 ??C)   SpO2: 98%   Weight: 106.6 kg (235 lb)   Height: 6\' 6"  (1.981 m)            Physical Exam   Constitutional: He is oriented to person, place, and time. He appears well-developed and well-nourished. No distress.   HENT:   Head: Normocephalic and atraumatic.   Mouth/Throat: Oropharynx is clear and moist. No oropharyngeal exudate or posterior oropharyngeal erythema.   Neck: Normal range of motion and full passive range of motion without pain. Neck supple.   Cardiovascular: Normal rate, regular rhythm, normal heart sounds, intact distal pulses and normal pulses.  Exam reveals no gallop and no friction rub.    No murmur heard.  Pulmonary/Chest: Effort normal and breath sounds normal. No accessory muscle usage. No respiratory distress. He has no decreased breath sounds. He has no wheezes. He has no rhonchi. He has no rales.   Abdominal: Soft. Bowel sounds are normal. He exhibits no distension. There is no tenderness. There is no  rebound, no guarding and no CVA tenderness.   Musculoskeletal: Normal range of motion. He exhibits no edema or tenderness.        Thoracic back: He exhibits no tenderness and no bony tenderness.        Lumbar back: He exhibits no tenderness and no bony tenderness.   Lymphadenopathy:     He has no cervical adenopathy.   Neurological: He is alert and oriented to person, place, and time. He has normal strength. He is not disoriented. No cranial nerve deficit or sensory deficit.   No focal deficits; 5/5 muscle strength in all extremities   Skin: Skin is warm. No lesion and no rash noted. Rash is not nodular. He is not diaphoretic.   Nursing note and vitals reviewed.       MDM  Number of Diagnoses or Management Options  Diagnosis management comments: DDX: medication refill       Amount and/or Complexity of Data Reviewed  Review and summarize past medical records: yes    Patient Progress  Patient progress: stable     ED Course       Procedures           DISCHARGE NOTE:    MEDICATIONS GIVEN:  Medications   valproic acid (DEPAKENE) capsule 500 mg (not administered)       IMPRESSION:  1. Nonintractable generalized idiopathic epilepsy without status epilepticus (HCC)        PLAN:  Current Discharge Medication List      START taking these medications    Details   !! divalproex DR (DEPAKOTE) 250 mg tablet Take 1 Tab by mouth nightly for 10 days.  Qty: 10 Tab, Refills: 0      valproic acid (DEPAKENE) 250 mg capsule Take 2 Caps by mouth daily for 10 days.  Qty: 20 Cap, Refills: 0       !! - Potential duplicate medications found. Please discuss with provider.      CONTINUE these medications which have NOT CHANGED    Details   !! divalproex DR (DEPAKOTE) 500 mg tablet Take 500 mg by mouth daily (with breakfast).      !! divalproex DR (DEPAKOTE) 250 mg tablet Take 250 mg by mouth nightly.       !! - Potential duplicate medications found. Please discuss with provider.        Follow-up Information     Follow up With Details Comments Contact Info    Phys Other, MD   Patient can only remember the practice name and not the physician          Return to ED if worse       Discharge Note:  3:40 AM  The patient has been re-evaluated and is ready for discharge. Reviewed available results with patient. Counseled patient on diagnosis and care plan. Patient has expressed understanding, and all questions have been answered. Patient agrees with plan and agrees to follow up as recommended, or to return to the ED if their symptoms worsen. Discharge instructions have been provided and explained to the patient, along with reasons to return to the ED.  Written by Baird Lyons, ED Scribe, as dictated by Blenda Mounts, MD.       ATTESTATION:  This note is prepared by Baird Lyons, acting as Scribe for Blenda Mounts, MD.    Blenda Mounts, MD :The scribe's documentation has been prepared under  my direction and personally reviewed by me in its entirety. I confirm  that the note above accurately reflects all work, treatment, procedures, and medical decision making performed by me.

## 2015-07-03 NOTE — ED Notes (Signed)
Patient given copy of dc instructions and 2 paper script(s) and 0 electronic scripts. Patient verbalized understanding of instructions and script(s). Patient given a current medication reconciliation form and verbalized understanding of their medications. Patient verbalized understanding of the importance of discussing medications with his or her physician or clinic they will be following up with. Patient alert and oriented and in no acute distress. Patient verbalizes pain of 0 out of 10.   Patient offered wheelchair from treatment area to hospital entrance, patient declined wheelchair. Patient discharged home with friend.

## 2015-07-12 ENCOUNTER — Ambulatory Visit (INDEPENDENT_AMBULATORY_CARE_PROVIDER_SITE_OTHER): Payer: Medicaid Other | Admitting: Hospitalist

## 2015-07-15 ENCOUNTER — Ambulatory Visit: Admit: 2015-07-15 | Payer: PRIVATE HEALTH INSURANCE | Attending: Family | Primary: Family

## 2015-07-15 ENCOUNTER — Inpatient Hospital Stay
Admit: 2015-07-15 | Discharge: 2015-07-15 | Disposition: A | Discharge status: Home / Self Care | Payer: MEDICAID | Attending: Emergency Medicine

## 2015-07-15 DIAGNOSIS — M7989 Other specified soft tissue disorders: Secondary | ICD-10-CM

## 2015-07-15 DIAGNOSIS — R569 Unspecified convulsions: Secondary | ICD-10-CM

## 2015-07-15 LAB — POC CHEM8
Anion gap (POC): 17 mmol/L — ABNORMAL HIGH (ref 5–15)
BUN (POC): 19 MG/DL (ref 9–20)
CO2 (POC): 26 MMOL/L (ref 21–32)
Calcium, ionized (POC): 1.25 MMOL/L (ref 1.12–1.32)
Chloride (POC): 102 MMOL/L (ref 98–107)
Creatinine (POC): 1 MG/DL (ref 0.6–1.3)
GFRAA, POC: >60 mL/min/{1.73_m2} (ref >60)
GFRNA, POC: >60 mL/min/{1.73_m2} (ref >60)
Glucose (POC): 74 MG/DL (ref 65–100)
Hematocrit (POC): 35 % — ABNORMAL LOW (ref 36.6–50.3)
Hemoglobin (POC): 11.9 GM/DL — ABNORMAL LOW (ref 12.1–17.0)
Potassium (POC): 4.1 MMOL/L (ref 3.5–5.1)
Sodium (POC): 140 MMOL/L (ref 136–145)

## 2015-07-15 MED ORDER — DIVALPROEX  250 MG 24 HR TAB
250 mg | ORAL_TABLET | Freq: Every evening | ORAL | 3 refills | Status: DC
Start: 2015-07-15 — End: 2015-08-22

## 2015-07-15 MED ORDER — DIVALPROEX 500 MG 24 HR TAB
500 mg | ORAL_TABLET | Freq: Every day | ORAL | 3 refills | Status: DC
Start: 2015-07-15 — End: 2015-08-22

## 2015-07-15 MED ORDER — DIVALPROEX 500 MG TAB, DELAYED RELEASE
500 mg | ORAL_TABLET | Freq: Every day | ORAL | 3 refills | Status: DC
Start: 2015-07-15 — End: 2015-07-15

## 2015-07-15 MED ORDER — DIVALPROEX 250 MG TAB, DELAYED RELEASE
250 mg | ORAL_TABLET | Freq: Every evening | ORAL | 3 refills | Status: DC
Start: 2015-07-15 — End: 2015-07-15

## 2015-07-15 NOTE — ED Triage Notes (Signed)
Pt reports he saw his doctor today and she pointed out bilateral lower leg swelling.  Pt states, "my friend suggested I should get it checked out before we left the hospital."

## 2015-07-15 NOTE — ED Provider Notes (Signed)
Patient is a 30 y.o. male presenting with lower extremity edema. The history is provided by the patient. No language interpreter was used.   Leg Swelling    This is a new problem. The problem occurs daily. The problem has not changed since onset.The pain is present in the right lower leg and left lower leg. The pain is at a severity of 0/10. The patient is experiencing no pain. Pertinent negatives include no numbness, full range of motion, no back pain and no neck pain. The symptoms are aggravated by standing. He has tried nothing for the symptoms. There has been no history of extremity trauma. none        Past Medical History:   Diagnosis Date   ??? Asthma    ??? Seizures (HCC)        History reviewed. No pertinent surgical history.      Family History:   Problem Relation Age of Onset   ??? Cancer Mother      unknown       Social History     Social History   ??? Marital status: SINGLE     Spouse name: N/A   ??? Number of children: N/A   ??? Years of education: N/A     Occupational History   ??? Not on file.     Social History Main Topics   ??? Smoking status: Current Every Day Smoker     Packs/day: 0.25   ??? Smokeless tobacco: Not on file      Comment: approx 2-3   ??? Alcohol use No   ??? Drug use: No   ??? Sexual activity: Not on file     Other Topics Concern   ??? Not on file     Social History Narrative    On disability         ALLERGIES: Review of patient's allergies indicates not on file.    Review of Systems   Constitutional: Negative for chills, fatigue and fever.   HENT: Negative for congestion and sore throat.    Eyes: Negative for redness.   Respiratory: Negative for cough, chest tightness and wheezing.    Cardiovascular: Negative for chest pain.   Gastrointestinal: Negative for abdominal pain.   Genitourinary: Negative for dysuria.   Musculoskeletal: Negative for arthralgias, back pain, myalgias, neck pain and neck stiffness.   Skin: Negative for rash.   Neurological: Negative for dizziness, syncope, weakness, light-headedness,  numbness and headaches.   Hematological: Negative for adenopathy.   Psychiatric/Behavioral: Negative for agitation and behavioral problems.   All other systems reviewed and are negative.      Vitals:    07/15/15 1721   BP: 116/56   Pulse: 72   Resp: 16   Temp: 98.1 ??F (36.7 ??C)   SpO2: 100%   Weight: 101.6 kg (224 lb)   Height:  (1.981 m)            Physical Exam   Constitutional: He is oriented to person, place, and time. He appears well-developed and well-nourished.   HENT:   Head: Normocephalic and atraumatic.   Right Ear: External ear normal.   Left Ear: External ear normal.   Mouth/Throat: Oropharynx is clear and moist.   Eyes: Conjunctivae are normal. Right eye exhibits no discharge. Left eye exhibits no discharge.   Neck: Normal range of motion. Neck supple.   Cardiovascular: Normal rate and regular rhythm.    Pulmonary/Chest: Effort normal and breath sounds normal. No respiratory distress. He  has no wheezes.   Abdominal: Soft. Bowel sounds are normal. There is no tenderness.   Musculoskeletal: Normal range of motion. He exhibits edema (mild LE swelling).   Lymphadenopathy:     He has no cervical adenopathy.   Neurological: He is alert and oriented to person, place, and time. No cranial nerve deficit.   Skin: Skin is warm and dry.   Psychiatric: He has a normal mood and affect. His behavior is normal. Judgment and thought content normal.   Nursing note and vitals reviewed.       MDM  Number of Diagnoses or Management Options  Leg swelling:   Diagnosis management comments: DDX dependent edema dehydration        Amount and/or Complexity of Data Reviewed  Discuss the patient with other providers: yes      ED Course       Procedures      Pt has been reevaluated. There are no new complaints, changes, or physical findings at this time. Medications have been reviewed w/ pt and/or family. Pt and/or family's questions have been answered. Pt and/or family  expressed good understanding of the dx/tx/rx and is in agreement with plan of care. Pt instructed and agreed to f/u w/ PCP and to return to ED upon further deterioration. Pt is ready for discharge.    LABORATORY TESTS:  No results found for this or any previous visit (from the past 12 hour(s)).    IMAGING RESULTS:  No orders to display     No results found.      MEDICATIONS GIVEN:  Medications - No data to display    IMPRESSION:  1. Leg swelling        PLAN:  1.   Discharge Medication List as of 07/15/2015  6:57 PM        2.   Follow-up Information     Follow up With Details Comments Contact Info    Alysha Z. Doreatha MassedNakoneczny, NP In 3 days  [address]  [address]  [place] [id number]  [phone]              Return to ED if worse

## 2015-07-15 NOTE — ED Notes (Signed)
Pt discharged by provider.
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
## 2015-07-15 NOTE — Patient Instructions (Signed)
1. Make an appt with the neurologist ASAP  2. Get your labs drawn downstairs today

## 2015-07-15 NOTE — ED Notes (Signed)
Pt arrives in the ED with complaints of bilateral leg swelling x couple of days.  Denies pain.  Denies cardiac history.

## 2015-07-15 NOTE — Progress Notes (Signed)
Pt here for   Chief Complaint   Patient presents with   ??? Hospital Follow Up     ER visit [facility] 2 weeks ago seizure activity     1. Have you been to the ER, urgent care clinic since your last visit?  Hospitalized since your last visit?Yes When: [facility] for seizure activity    2. Have you seen or consulted any other health care providers outside [facility] since your last visit?  Include any pap smears or colon screening. No     Pt denies pain at this time

## 2015-07-15 NOTE — ED Notes (Signed)
Emergency Department Nursing Plan of Care       The Nursing Plan of Care is developed from the Nursing assessment and Emergency Department Attending provider initial evaluation.  The plan of care may be reviewed in the ???ED Provider note???.    The Plan of Care was developed with the following considerations:   Patient / Family readiness to learn indicated [id number]:verbalized understanding  Persons(s) to be included in education: patient  Barriers to Learning/Limitations:No    Signed     Rolena InfanteKatherine L Guilford    07/15/2015   5:36 PM

## 2015-07-22 ENCOUNTER — Encounter: Attending: Family | Primary: Family

## 2015-07-24 DIAGNOSIS — G40309 Generalized idiopathic epilepsy and epileptic syndromes, not intractable, without status epilepticus: Secondary | ICD-10-CM
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
## 2015-07-24 NOTE — ED Notes (Addendum)
[facility] sent message requesting dose to be sent to us.

## 2015-07-24 NOTE — ED Notes (Signed)
Patient given copy of dc instructions and 2 paper script(s) and 0 electronic scripts. Patient verbalized understanding of instructions and script(s). Patient given a current medication reconciliation form and verbalized understanding of their medications. Patient verbalized understanding of the importance of discussing medications with his or her physician or clinic they will be following up with. Patient alert and oriented and in no acute distress. Patient verbalizes pain of 0 out of 10.   Patient offered wheelchair from treatment area to hospital entrance, patient declined wheelchair. Patient discharged home with friend.
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
## 2015-07-25 ENCOUNTER — Inpatient Hospital Stay
Admit: 2015-07-25 | Discharge: 2015-07-25 | Disposition: A | Discharge status: Home / Self Care | Payer: MEDICAID | Attending: Emergency Medicine

## 2015-07-25 MED ORDER — DIVALPROEX 250 MG TAB, DELAYED RELEASE
250 mg | ORAL | Status: AC
Start: 2015-07-25 — End: 2015-07-24
  Administered 2015-07-25: 03:00:00 via ORAL

## 2015-07-25 MED ORDER — DIVALPROEX 500 MG TAB, DELAYED RELEASE
500 mg | ORAL_TABLET | Freq: Every day | ORAL | 0 refills | Status: AC
Start: 2015-07-25 — End: 2015-08-03

## 2015-07-25 MED ORDER — DIVALPROEX 250 MG TAB, DELAYED RELEASE
250 mg | ORAL_TABLET | Freq: Every evening | ORAL | 0 refills | Status: AC
Start: 2015-07-25 — End: 2015-08-03

## 2015-07-25 MED FILL — DIVALPROEX 250 MG TAB, DELAYED RELEASE: 250 mg | ORAL | Qty: 1

## 2015-08-01 ENCOUNTER — Inpatient Hospital Stay: Admit: 2015-08-01 | Payer: MEDICAID | Primary: Family

## 2015-08-01 ENCOUNTER — Ambulatory Visit: Admit: 2015-08-01 | Discharge: 2015-08-01 | Payer: PRIVATE HEALTH INSURANCE | Attending: Family | Primary: Family

## 2015-08-01 DIAGNOSIS — R569 Unspecified convulsions: Secondary | ICD-10-CM

## 2015-08-01 NOTE — Progress Notes (Signed)
1. Have you been to the ER, urgent care clinic since your last visit?  Hospitalized since your last visit?Yes When: 07[facility] med [facility].    2. Have you seen or consulted any other health care providers outside of the [facility] since your last visit?  Include any pap smears or colon screening. No.
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
## 2015-08-01 NOTE — Progress Notes (Signed)
Complex case management  Navigator received referral from Pulte Homeslysha Z. Doreatha MassedNakoneczny, NP to assess this patient for case management.  The patient is new to the practice and to [place]. The patient has had 3 ED encounters over the past 4 weeks.  During the initial office visit, the patient was directed to have labs completed and schedule an appointment with neurology. These were not completed prior to today's appointment. Alysha Z. Nakoneczny, NP  found out that the dose schedule of his Depakote was complex and the patient was not taking his medication as directed and suffering from seizure activity more than once per month.   I predict that this patient is going to require case management interventions that will span for greater than one month. He will be enrolled into a complex case management episode.     We have started health related goals for the patient to work towards. We shall assess his progress approximately every 2 weeks with adjustments as necessary.   Goals Addressed     ??? Supportive resources in place to maintain patient in the community.                  1.seizure disorder:  Referral to Neurology to optimize treatment.  Initial appointment scheduled for [id number].   Lab order for Valproic acid level. Completed 08/01/2015      2. Homelessness: [facility]. The patient is in a temporary shelter near [address]    3. Homelessness: self referral to the Work[employer] resource to gain employment and supplement disability income. Navigator provided detailed instructions to travel to the nearest location to enroll in available local resources.

## 2015-08-02 LAB — VALPROIC ACID: Valproic acid: 78 ug/mL (ref 50–100)

## 2015-08-05 ENCOUNTER — Inpatient Hospital Stay
Admit: 2015-08-05 | Discharge: 2015-08-05 | Disposition: A | Discharge status: Home / Self Care | Payer: PRIVATE HEALTH INSURANCE | Attending: Emergency Medicine

## 2015-08-05 ENCOUNTER — Emergency Department: Admit: 2015-08-05 | Payer: PRIVATE HEALTH INSURANCE | Primary: Family

## 2015-08-05 ENCOUNTER — Emergency Department

## 2015-08-05 DIAGNOSIS — R1013 Epigastric pain: Secondary | ICD-10-CM

## 2015-08-05 LAB — CBC WITH AUTOMATED DIFF
ABS. BASOPHILS: 0 10*3/uL (ref 0.0–0.1)
ABS. EOSINOPHILS: 0 10*3/uL (ref 0.0–0.4)
ABS. LYMPHOCYTES: 2.8 10*3/uL (ref 0.8–3.5)
ABS. MONOCYTES: 0.5 10*3/uL (ref 0.0–1.0)
ABS. NEUTROPHILS: 4.7 10*3/uL (ref 1.8–8.0)
BASOPHILS: 0 % (ref 0–1)
EOSINOPHILS: 0 % (ref 0–7)
HCT: 32.4 % — ABNORMAL LOW (ref 36.6–50.3)
HGB: 10.5 g/dL — ABNORMAL LOW (ref 12.1–17.0)
LYMPHOCYTES: 35 % (ref 12–49)
MCH: 25.6 PG — ABNORMAL LOW (ref 26.0–34.0)
MCHC: 32.4 g/dL (ref 30.0–36.5)
MCV: 79 FL — ABNORMAL LOW (ref 80.0–99.0)
MONOCYTES: 6 % (ref 5–13)
NEUTROPHILS: 59 % (ref 32–75)
PLATELET: 213 10*3/uL (ref 150–400)
RBC: 4.1 M/uL (ref 4.10–5.70)
RDW: 12.7 % (ref 11.5–14.5)
WBC: 8 10*3/uL (ref 4.1–11.1)

## 2015-08-05 LAB — METABOLIC PANEL, BASIC
Anion gap: 8 mmol/L (ref 5–15)
BUN/Creatinine ratio: 16 (ref 12–20)
BUN: 17 MG/DL (ref 6–20)
CO2: 29 mmol/L (ref 21–32)
Calcium: 8.7 MG/DL (ref 8.5–10.1)
Chloride: 106 mmol/L (ref 97–108)
Creatinine: 1.06 MG/DL (ref 0.70–1.30)
GFR est AA: >60 mL/min/{1.73_m2} (ref >60)
GFR est non-AA: >60 mL/min/{1.73_m2} (ref >60)
Glucose: 87 mg/dL (ref 65–100)
Potassium: 4 mmol/L (ref 3.5–5.1)
Sodium: 143 mmol/L (ref 136–145)

## 2015-08-05 LAB — CK W/ CKMB & INDEX
CK - MB: 1.9 NG/ML (ref <3.6)
CK-MB Index: 0.6 (ref 0–2.5)
CK: 302 U/L (ref 39–308)

## 2015-08-05 LAB — TROPONIN I: Troponin-I, Qt.: <0.04 ng/mL (ref <0.05)

## 2015-08-05 MED ORDER — ALUMINUM-MAGNESIUM HYDROXIDE 200 MG-200 MG/5 ML ORAL SUSP
200-200 mg/5 mL | ORAL | Status: AC
Start: 2015-08-05 — End: 2015-08-05
  Administered 2015-08-05: 07:00:00 via ORAL

## 2015-08-05 MED ORDER — FAMOTIDINE 20 MG TAB
20 mg | ORAL | Status: AC
Start: 2015-08-05 — End: 2015-08-05
  Administered 2015-08-05: 07:00:00 via ORAL

## 2015-08-05 MED FILL — FAMOTIDINE 20 MG TAB: 20 mg | ORAL | Qty: 1

## 2015-08-05 MED FILL — MAG-AL 200 MG-200 MG/5 ML ORAL SUSPENSION: 200-200 mg/5 mL | ORAL | Qty: 30

## 2015-08-05 NOTE — ED Notes (Signed)
Pt presents ambulatory to ED complaining of pain to the epigastric area and right side of his chest x1.5 hrs. Pt denies taking medications for relief of symptoms, pt resting in NAD. Pt is alert and oriented x 4, RR even and unlabored, skin is warm and dry. Assesment completed and pt updated on plan of care.       Emergency Department Nursing Plan of Care       The Nursing Plan of Care is developed from the Nursing assessment and Emergency Department Attending provider initial evaluation.  The plan of care may be reviewed in the ???ED Provider note???.    The Plan of Care was developed with the following considerations:   Patient / Family readiness to learn indicated [id number]:verbalized understanding  Persons(s) to be included in education: patient  Barriers to Learning/Limitations:No    Signed     Idalia Needlemanda N Taylor, RN    08/05/2015   2:43 AM

## 2015-08-05 NOTE — ED Provider Notes (Signed)
HPI Comments: Connor Prince is a 30 y.o. male with history significant for asthma [facility] with c/o sudden onset of upper abdominal pain. Per pt, the current onset of his pain is a moderate 8/10 intensity with an associated sore, throbbing, constant sensation to the right epigastric region. Pt reports his pain has been persistent since around 0100 this morning and has been gradually worsening without relief. Pt expresses concern to be evaluated. Pt specifically denies any nausea, vomiting, fevers, chills, chest pain, or SOB.    PCP: Connor CoveAlysha Z. Nakoneczny, NP    PMHx: seizures  Social Hx: + EtOH; + Smoker; - Illicit Drugs    There are no other changes, complaints or physical findings at this time.   Written by Connor Prince, ED Scribe, as dictated by Blenda MountsJames W Forest-Lam, MD.     The history is provided by the patient.      Past Medical History:   Diagnosis Date   ??? Asthma    ??? Seizures (HCC)      History reviewed. No pertinent surgical history.      Family History:   Problem Relation Age of Onset   ??? Cancer Mother      unknown     Social History     Social History   ??? Marital status: SINGLE     Spouse name: N/A   ??? Number of children: N/A   ??? Years of education: N/A     Occupational History   ??? Not on file.     Social History Main Topics   ??? Smoking status: Current Every Day Smoker     Packs/day: 0.25   ??? Smokeless tobacco: Not on file      Comment: approx 2-3   ??? Alcohol use Yes      Comment: Occassionally   ??? Drug use: No   ??? Sexual activity: Not on file     Other Topics Concern   ??? Not on file     Social History Narrative    On disability     ALLERGIES: Review of patient's allergies indicates no known allergies.    Review of Systems   Constitutional: Negative.  Negative for chills, diaphoresis and fever.   HENT: Negative.  Negative for rhinorrhea and sore throat.    Eyes: Negative.  Negative for visual disturbance.   Respiratory: Negative.  Negative for cough and shortness of breath.     Cardiovascular: Negative.  Negative for chest pain.   Gastrointestinal: Positive for abdominal pain. Negative for constipation, diarrhea, nausea and vomiting.   Endocrine: Negative.    Genitourinary: Negative.  Negative for dysuria, frequency, hematuria and urgency.   Musculoskeletal: Negative.  Negative for back pain.   Skin: Negative.  Negative for wound.   Allergic/Immunologic: Negative.    Neurological: Negative.  Negative for syncope, numbness and headaches.   Hematological: Negative.    Psychiatric/Behavioral: Negative.    All other systems reviewed and are negative.    Vitals:    08/05/15 0210   BP: 153/64   Pulse: 86   Resp: 20   Temp: 98.5 ??F (36.9 ??C)   SpO2: 99%   Weight: 99.8 kg (220 lb)   Height: 6\' 6"  (1.981 m)          Physical Exam   Constitutional: He is oriented to person, place, and time. He appears well-developed and well-nourished. He does not appear ill. No distress.   HENT:   Head: Normocephalic and atraumatic.  Right Ear: External ear normal.   Left Ear: External ear normal.   Nose: Nose normal.   Mouth/Throat: Oropharynx is clear and moist.   Eyes: Pupils are equal, round, and reactive to light.   Neck: Normal range of motion.   Cardiovascular: Normal rate, regular rhythm and normal pulses.  Exam reveals no gallop and no friction rub.    No murmur heard.  Pulmonary/Chest: Effort normal and breath sounds normal. No respiratory distress. He has no wheezes. He has no rhonchi. He has no rales.   Abdominal: Soft. He exhibits no distension. There is no tenderness. There is no rebound and no guarding.   Musculoskeletal: Normal range of motion. He exhibits no edema.   Neurological: He is alert and oriented to person, place, and time. He has normal strength.   Skin: Skin is warm and dry. No rash noted. He is not diaphoretic.   Psychiatric: He has a normal mood and affect. His speech is normal and behavior is normal.   Nursing note and vitals reviewed.     MDM   Number of Diagnoses or Management Options  Diagnosis management comments: DDx: probable GERD, gastritis, rule out ACS, unlikely PNA, MI       Amount and/or Complexity of Data Reviewed  Clinical lab tests: ordered and reviewed  Tests in the radiology section of CPT??: ordered and reviewed  Tests in the medicine section of CPT??: reviewed and ordered  Review and summarize past medical records: yes  Independent visualization of images, tracings, or specimens: yes    Patient Progress  Patient progress: stable    ED Course     Procedures    LABORATORY TESTS:  Recent Results (from the past 12 hour(s))   EKG, 12 LEAD, INITIAL    Collection Time: 08/05/15  2:18 AM   Result Value Ref Range    Ventricular Rate 83 BPM    Atrial Rate 83 BPM    P-R Interval 126 ms    QRS Duration 94 ms    Q-T Interval 370 ms    QTC Calculation (Bezet) 434 ms    Calculated P Axis 44 degrees    Calculated R Axis 39 degrees    Calculated T Axis 55 degrees    Diagnosis       Normal sinus rhythm  Early repolarization  Normal ECG  No previous ECGs available     METABOLIC PANEL, BASIC    Collection Time: 08/05/15  2:37 AM   Result Value Ref Range    Sodium 143 136 - 145 mmol/L    Potassium 4.0 3.5 - 5.1 mmol/L    Chloride 106 97 - 108 mmol/L    CO2 29 21 - 32 mmol/L    Anion gap 8 5 - 15 mmol/L    Glucose 87 65 - 100 mg/dL    BUN 17 6 - 20 MG/DL    Creatinine 9.60 4.54 - 1.30 MG/DL    BUN/Creatinine ratio 16 12 - 20      GFR est AA >60 >60 ml/min/1.75m2    GFR est non-AA >60 >60 ml/min/1.29m2    Calcium 8.7 8.5 - 10.1 MG/DL   CBC WITH AUTOMATED DIFF    Collection Time: 08/05/15  2:37 AM   Result Value Ref Range    WBC 8.0 4.1 - 11.1 K/uL    RBC 4.10 4.10 - 5.70 M/uL    HGB 10.5 (L) 12.1 - 17.0 g/dL    HCT 09.8 (L) 11.9 - 50.3 %  MCV 79.0 (L) 80.0 - 99.0 FL    MCH 25.6 (L) 26.0 - 34.0 PG    MCHC 32.4 30.0 - 36.5 g/dL    RDW 81.1 91.4 - 78.2 %    PLATELET 213 150 - 400 K/uL    NEUTROPHILS 59 32 - 75 %    LYMPHOCYTES 35 12 - 49 %    MONOCYTES 6 5 - 13 %     EOSINOPHILS 0 0 - 7 %    BASOPHILS 0 0 - 1 %    ABS. NEUTROPHILS 4.7 1.8 - 8.0 K/UL    ABS. LYMPHOCYTES 2.8 0.8 - 3.5 K/UL    ABS. MONOCYTES 0.5 0.0 - 1.0 K/UL    ABS. EOSINOPHILS 0.0 0.0 - 0.4 K/UL    ABS. BASOPHILS 0.0 0.0 - 0.1 K/UL   TROPONIN I    Collection Time: 08/05/15  2:37 AM   Result Value Ref Range    Troponin-I, Qt. <0.04 <0.05 ng/mL   CK W/ CKMB & INDEX    Collection Time: 08/05/15  2:37 AM   Result Value Ref Range    CK 302 39 - 308 U/L    CK - MB 1.9 <3.6 NG/ML    CK-MB Index 0.6 0 - 2.5       IMAGING RESULTS:  INDICATION: epigastric pain   ??  Exam: Chest 2 views. No comparisons.   ??  Findings: Cardiomediastinal silhouette is normal. Pulmonary vasculature is not  engorged. No focal parenchymal opacities, effusions, or pneumothorax. Bony  thorax is intact.  ??  IMPRESSION  Impression:  1. No acute cardiopulmonary disease    MEDICATIONS GIVEN:  Medications   famotidine (PEPCID) tablet 20 mg (20 mg Oral Given 08/05/15 0253)   aluminum-magnesium hydroxide (MAALOX) oral suspension 30 mL (30 mL Oral Given 08/05/15 0253)     IMPRESSION:  1. Abdominal pain, epigastric      PLAN:  1.   Follow-up Information     Follow up With Details Comments Contact Info    Connor Z. Doreatha Massed, NP   [address]  [address] [place] [address]  [phone]          Return to ED if worse     DISCHARGE NOTE:    3:44 AM  The patient is ready for discharge. The patient signs, symptoms, diagnosis, and discharge instructions have been discussed and the patient has conveyed their understanding. The patient is to follow-up as reccommended or returned to the ER should their symptoms worsen. Plan has been discussed and patient is in agreement.     This note is prepared by Connor Hews acting as Scribe for Blenda Mounts, MD.    Blenda Mounts, MD: This scribe's documentation has been prepared under my direction and personally reviewed by me in its entirety. I  confirm that the note above accurately reflects all work, treatment procedures and medical decision makings performed by me.

## 2015-08-05 NOTE — ED Notes (Signed)
Discharge instructions were given to the patient by Amanda N Taylor, RN.     The patient left the Emergency Department ambulatory, alert and oriented and in no acute distress with 0 prescriptions. The patient was encouraged to call or return to the ED for worsening issues or problems and was encouraged to schedule a follow up appointment for continuing care.     The patient verbalized understanding of discharge instructions and prescriptions, all questions were answered. The patient has no further concerns at this time.

## 2015-08-06 LAB — EKG, 12 LEAD, INITIAL
Atrial Rate: 78 {beats}/min
Calculated P Axis: 18 degrees
Calculated R Axis: 36 degrees
Calculated T Axis: 46 degrees
Diagnosis: NORMAL
P-R Interval: 128 ms
Q-T Interval: 376 ms
QRS Duration: 86 ms
QTC Calculation (Bezet): 428 ms
Ventricular Rate: 78 {beats}/min

## 2015-08-06 LAB — EKG 12-LEAD
Atrial Rate: 78 {beats}/min
Diagnosis: NORMAL
P Axis: 18 degrees
P-R Interval: 128 ms
Q-T Interval: 376 ms
QRS Duration: 86 ms
QTc Calculation (Bazett): 428 ms
R Axis: 36 degrees
T Axis: 46 degrees
Ventricular Rate: 78 {beats}/min

## 2015-08-08 ENCOUNTER — Encounter: Attending: Neurology | Primary: Family

## 2015-08-22 ENCOUNTER — Encounter

## 2015-08-22 ENCOUNTER — Ambulatory Visit: Admit: 2015-08-22 | Discharge: 2015-08-22 | Payer: PRIVATE HEALTH INSURANCE | Attending: Neurology | Primary: Family

## 2015-08-22 DIAGNOSIS — G40209 Localization-related (focal) (partial) symptomatic epilepsy and epileptic syndromes with complex partial seizures, not intractable, without status epilepticus: Secondary | ICD-10-CM

## 2015-08-22 MED ORDER — SUMATRIPTAN 100 MG TAB
100 mg | ORAL_TABLET | Freq: Once | ORAL | 1 refills | Status: DC | PRN
Start: 2015-08-22 — End: 2016-01-04

## 2015-08-22 MED ORDER — TOPIRAMATE 25 MG TAB
25 mg | ORAL_TABLET | Freq: Every evening | ORAL | 0 refills | Status: DC
Start: 2015-08-22 — End: 2016-01-04

## 2015-08-22 MED ORDER — DIVALPROEX 500 MG TAB, DELAYED RELEASE
500 mg | ORAL_TABLET | Freq: Two times a day (BID) | ORAL | 2 refills | Status: DC
Start: 2015-08-22 — End: 2015-10-03

## 2015-08-22 NOTE — Progress Notes (Addendum)
Neurology Consult Note      HISTORY PROVIDED BY: patient    Chief Complaint:   Chief Complaint   Patient presents with   ??? Seizure   ??? New Patient      Subjective:    Connor Prince is a 30 y.o. right handed male who presents in consultation for seizure and headache    Past Medical History:   Diagnosis Date   ??? Asthma    ??? Chest pain    ??? Cough    ??? Kidney stone    ??? Migraine    ??? Muscle pain    ??? Seizures ([place])    ??? Skipped beats    ??? SOB (shortness of breath)       History reviewed. No pertinent surgical history.   Social History     Social History   ??? Marital status: SINGLE     Spouse name: N/A   ??? Number of children: N/A   ??? Years of education: N/A     Occupational History   ??? Not on file.     Social History Main Topics   ??? Smoking status: Current Every Day Smoker     Packs/day: 0.25   ??? Smokeless tobacco: Never Used      Comment: approx 2-3   ??? Alcohol use Yes      Comment: Occassionally   ??? Drug use: No   ??? Sexual activity: Not on file     Other Topics Concern   ??? Not on file     Social History Narrative    On disability     Family History   Problem Relation Age of Onset   ??? Cancer Mother      stomach   ??? Migraines Mother    ??? Seizures Mother          Objective:   ROS    No Known Allergies     Meds:  Outpatient Medications Prior to Visit   Medication Sig Dispense Refill   ??? divalproex ER (DEPAKOTE ER) 250 mg ER tablet Take 1 Tab by mouth nightly. 30 Tab 3   ??? divalproex ER (DEPAKOTE ER) 500 mg ER tablet Take 1 Tab by mouth daily (with breakfast). 30 Tab 3     No facility-administered medications prior to visit.        Imaging:  MRI Results (most recent):  No results found for this or any previous visit.   CT Results (most recent):  No results found for this or any previous visit.     Reviewed records in [facility] and media tab today    Lab Review   Results for orders placed or performed during the hospital encounter of 04/54/09   METABOLIC PANEL, BASIC   Result Value Ref Range     Sodium 143 136 - 145 mmol/L    Potassium 4.0 3.5 - 5.1 mmol/L    Chloride 106 97 - 108 mmol/L    CO2 29 21 - 32 mmol/L    Anion gap 8 5 - 15 mmol/L    Glucose 87 65 - 100 mg/dL    BUN 17 6 - 20 MG/DL    Creatinine 1.06 0.70 - 1.30 MG/DL    BUN/Creatinine ratio 16 12 - 20      GFR est AA >60 >60 ml/min/1.53m    GFR est non-AA >60 >60 ml/min/1.769m   Calcium 8.7 8.5 - 10.1 MG/DL   CBC WITH AUTOMATED DIFF   Result Value  Ref Range    WBC 8.0 4.1 - 11.1 K/uL    RBC 4.10 4.10 - 5.70 M/uL    HGB 10.5 (L) 12.1 - 17.0 g/dL    HCT 32.4 (L) 36.6 - 50.3 %    MCV 79.0 (L) 80.0 - 99.0 FL    MCH 25.6 (L) 26.0 - 34.0 PG    MCHC 32.4 30.0 - 36.5 g/dL    RDW 12.7 11.5 - 14.5 %    PLATELET 213 150 - 400 K/uL    NEUTROPHILS 59 32 - 75 %    LYMPHOCYTES 35 12 - 49 %    MONOCYTES 6 5 - 13 %    EOSINOPHILS 0 0 - 7 %    BASOPHILS 0 0 - 1 %    ABS. NEUTROPHILS 4.7 1.8 - 8.0 K/UL    ABS. LYMPHOCYTES 2.8 0.8 - 3.5 K/UL    ABS. MONOCYTES 0.5 0.0 - 1.0 K/UL    ABS. EOSINOPHILS 0.0 0.0 - 0.4 K/UL    ABS. BASOPHILS 0.0 0.0 - 0.1 K/UL   TROPONIN I   Result Value Ref Range    Troponin-I, Qt. <0.04 <0.05 ng/mL   CK W/ CKMB & INDEX   Result Value Ref Range    CK 302 39 - 308 U/L    CK - MB 1.9 <3.6 NG/ML    CK-MB Index 0.6 0 - 2.5     EKG, 12 LEAD, INITIAL   Result Value Ref Range    Ventricular Rate 78 BPM    Atrial Rate 78 BPM    P-R Interval 128 ms    QRS Duration 86 ms    Q-T Interval 376 ms    QTC Calculation (Bezet) 428 ms    Calculated P Axis 18 degrees    Calculated R Axis 36 degrees    Calculated T Axis 46 degrees    Diagnosis       Normal sinus rhythm  ST elevation, probably due to early repolarization    Confirmed by Baird [facility] [phone]) on 08/06/2015 12:18:18 PM          Exam:  Visit Vitals   ??? BP 115/65 (BP 1 Location: Right arm, BP Patient Position: Sitting)   ??? Pulse 69   ??? Temp 97.9 ??F (36.6 ??C) (Temporal)   ??? Resp 16   ??? Ht '6\' 6"'  (1.981 m)   ??? Wt 216 lb 9.6 oz (98.2 kg)   ??? SpO2 98%   ??? BMI 25.03 kg/m2      General:  Alert, cooperative, no distress.   Head:  Normocephalic, without obvious abnormality, atraumatic.   Respiratory:  Heart:   Non labored breathing  Regular rate and rhythm, no murmurs   Neck:   2+ carotids, no bruits   Extremities: Warm, no cyanosis or edema.   Pulses: 2+ radial pulses.       Neurologic:  MS: Alert and oriented x 4, speech intact. Language intact, able to name, repeat, and follow all commands. Attention and fund of knowledge appropriate.  Recent and remote memory intact.  Cranial Nerves:  II: visual fields Full to confrontation   II: pupils Equal, round, reactive to light   II: optic disc No papilledema   III,VII: ptosis none   III,IV,VI: extraocular muscles  EOMI, no nystagmus or diplopia   V: facial light touch sensation  normal   VII: facial muscle function   symmetric   VIII: hearing intact   IX: soft palate elevation  normal  XI: trapezius strength  5/5   XI: sternocleidomastoid strength 5/5   XII: tongue  Midline     Motor: normal bulk and tone, no tremor              Strength: 5/5 throughout, no PD  Sensory: intact to LT, PP  Coordination: FTN and HTS intact, RAM intact,Romberg negative  Gait: normal gait, able to heel, toe, and tandem walk  Reflexes: 2+ symmetric, toes downgoing           Assessment/Plan       ICD-10-CM ICD-9-CM    1. Partial symptomatic epilepsy with complex partial seizures, not intractable, without status epilepticus ([place]) G40.209 345.40 EEG      MRI BRAIN W WO CONT   2. 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 Intractable migraine without aura and without status migrainosus G43.019 346.11 VITAMIN B12      SED RATE (ESR)      ANA, DIRECT, W/REFLEX      TSH 3RD GENERATION      RHEUMATOID FACTOR, QL      ALDOLASE      VITAMIN D, 1, 25 DIHYDROXY       MRI BRAIN W WO CONT   4. Paresthesia R20.2 782.0 VITAMIN B12      SED RATE (ESR)      TSH 3RD GENERATION      RHEUMATOID FACTOR, QL      ALDOLASE      VITAMIN D, 1, 25 DIHYDROXY      MRI BRAIN W WO CONT       Follow-up Disposition:  Return in about 6 weeks (around 10/03/2015).      Signed:  Nelwyn Salisbury, MD  08/22/2015

## 2015-08-22 NOTE — Progress Notes (Signed)
Neurology Consult Note      HISTORY PROVIDED BY: patient    Chief Complaint:   Chief Complaint   Patient presents with   ??? Seizure   ??? New Patient      Subjective:    Connor Prince is a 30 y.o. right handed black male who presents in consultation for Seizure and headache evaluation.  According to patient, he started having seizure at about 30 years of age as far as he can recollect.Seizure would come on him wIthout warning, and he would loose consciousness, fall out.  Says he occasionally bit his tongue and incontinence of urine, as adult, he has not had incontinence.Frequency of seizure is about 1-2 per month, last seizure was 5 days ago 08/16/15.  Patient moved from [place] about 83month ago,he has not followed [place]rolog[place]  He also complain of headaches, headaches may or may not be associated with seizure, headache is throbbing in nature, at times, sharp pain, no aggravating factor.  Frequency of headache is variable, about 2-3x/week, throbbing in nature,associated with photophobia, phonophobia.  Admits nausea, occasional vomiting,neck pain, chest pain, joint pain, numbness and tingling sensation.  Denies dysphagia, odynophagia, vertigo, hearing difficulty, constipation, diarrheaadysuria, hematuria, hematochezia    Past Medical History:   Diagnosis Date   ??? Asthma    ??? Chest pain    ??? Cough    ??? Kidney stone    ??? Migraine    ??? Muscle pain    ??? Seizures [place]    ??? Skipped beats    ??? SOB (shortness of breath)       History reviewed. No pertinent surgical history.   Social History     Social History   ??? Marital status: SINGLE     Spouse name: N/A   ??? Number of children: N/A   ??? Years of education: N/A     Occupational History   ??? Not on file.     Social History Main Topics   ??? Smoking status: Current Every Day Smoker     Packs/day: 0.25   ??? Smokeless tobacco: Never Used      Comment: approx 2-3   ??? Alcohol use Yes      Comment: Occassionally   ??? Drug use: No    ??? Sexual activity: Not on file     Other Topics Concern   ??? Not on file     Social History Narrative    On disability     Family History   Problem Relation Age of Onset   ??? Cancer Mother      stomach   ??? Migraines Mother    ??? Seizures Mother          Objective:   ROS  12 system review unremarkabble except as per HPI  No Known Allergies     Meds:  Outpatient Medications Prior to Visit   Medication Sig Dispense Refill   ??? divalproex ER (DEPAKOTE ER) 250 mg ER tablet Take 1 Tab by mouth nightly. 30 Tab 3   ??? divalproex ER (DEPAKOTE ER) 500 mg ER tablet Take 1 Tab by mouth daily (with breakfast). 30 Tab 3     No facility-administered medications prior to visit.        Imaging:  MRI Results (most recent):  No results found for this or any previous visit.   CT Results (most recent):  No results found for this or any previous visit.  Reviewed records in [facility] and media tab today    Lab Review   Results for orders placed or performed during the hospital encounter of 27/78/24   METABOLIC PANEL, BASIC   Result Value Ref Range    Sodium 143 136 - 145 mmol/L    Potassium 4.0 3.5 - 5.1 mmol/L    Chloride 106 97 - 108 mmol/L    CO2 29 21 - 32 mmol/L    Anion gap 8 5 - 15 mmol/L    Glucose 87 65 - 100 mg/dL    BUN 17 6 - 20 MG/DL    Creatinine 1.06 0.70 - 1.30 MG/DL    BUN/Creatinine ratio 16 12 - 20      GFR est AA >60 >60 ml/min/1.77m    GFR est non-AA >60 >60 ml/min/1.750m   Calcium 8.7 8.5 - 10.1 MG/DL   CBC WITH AUTOMATED DIFF   Result Value Ref Range    WBC 8.0 4.1 - 11.1 K/uL    RBC 4.10 4.10 - 5.70 M/uL    HGB 10.5 (L) 12.1 - 17.0 g/dL    HCT 32.4 (L) 36.6 - 50.3 %    MCV 79.0 (L) 80.0 - 99.0 FL    MCH 25.6 (L) 26.0 - 34.0 PG    MCHC 32.4 30.0 - 36.5 g/dL    RDW 12.7 11.5 - 14.5 %    PLATELET 213 150 - 400 K/uL    NEUTROPHILS 59 32 - 75 %    LYMPHOCYTES 35 12 - 49 %    MONOCYTES 6 5 - 13 %    EOSINOPHILS 0 0 - 7 %    BASOPHILS 0 0 - 1 %    ABS. NEUTROPHILS 4.7 1.8 - 8.0 K/UL     ABS. LYMPHOCYTES 2.8 0.8 - 3.5 K/UL    ABS. MONOCYTES 0.5 0.0 - 1.0 K/UL    ABS. EOSINOPHILS 0.0 0.0 - 0.4 K/UL    ABS. BASOPHILS 0.0 0.0 - 0.1 K/UL   TROPONIN I   Result Value Ref Range    Troponin-I, Qt. <0.04 <0.05 ng/mL   CK W/ CKMB & INDEX   Result Value Ref Range    CK 302 39 - 308 U/L    CK - MB 1.9 <3.6 NG/ML    CK-MB Index 0.6 0 - 2.5     EKG, 12 LEAD, INITIAL   Result Value Ref Range    Ventricular Rate 78 BPM    Atrial Rate 78 BPM    P-R Interval 128 ms    QRS Duration 86 ms    Q-T Interval 376 ms    QTC Calculation (Bezet) 428 ms    Calculated P Axis 18 degrees    Calculated R Axis 36 degrees    Calculated T Axis 46 degrees    Diagnosis       Normal sinus rhythm  ST elevation, probably due to early repolarization    Confirmed by BaBaird [phone] 08/06/2015 12:18:18 PM          Exam:  Visit Vitals   ??? BP 115/65 (BP 1 Location: Right arm, BP Patient Position: Sitting)   ??? Pulse 69   ??? Temp 97.9 ??F (36.6 ??C) (Temporal)   ??? Resp 16   ??? Ht '6\' 6"'  (1.981 m)   ??? Wt 216 lb 9.6 oz (98.2 kg)   ??? SpO2 98%   ??? BMI 25.03 kg/m2     General:  Alert, cooperative, no distress.  Head:  Normocephalic, without obvious abnormality, atraumatic.   Respiratory:  Heart:   Non labored breathing  Regular rate and rhythm, no murmurs   Neck:   2+ carotids, no bruits   Extremities: Warm, no cyanosis or edema.   Pulses: 2+ radial pulses.       Neurologic:  MS: Alert and oriented x 4, speech intact. Language intact, able to name, repeat, and follow all commands. Attention and fund of knowledge appropriate.  Recent and remote memory intact.  Cranial Nerves:  II: visual fields Full to confrontation   II: pupils Equal, round, reactive to light   II: optic disc No papilledema   III,VII: ptosis none   III,IV,VI: extraocular muscles  EOMI, no nystagmus or diplopia   V: facial light touch sensation  normal   VII: facial muscle function   symmetric   VIII: hearing intact   IX: soft palate elevation  normal    XI: trapezius strength  5/5   XI: sternocleidomastoid strength 5/5   XII: tongue  Midline     Motor: normal bulk and tone, no tremor              Strength: 5/5 throughout, no PD  Sensory: intact to LT, PP  Coordination: FTN and HTS intact, RAM intact,Romberg negative  Gait: Unsteady gait,Unable to heel, toe, and tandem walk  Reflexes: 2+ symmetric, toes downgoing           Assessment/Plan       ICD-10-CM ICD-9-CM    1. Partial symptomatic epilepsy with complex partial seizures, not intractable, without status epilepticus ([place]) G40.209 345.40 EEG      MRI BRAIN W WO CONT   2. 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 Intractable migraine without aura and without status migrainosus G43.019 346.11 VITAMIN B12      SED RATE (ESR)      ANA, DIRECT, W/REFLEX      TSH 3RD GENERATION      RHEUMATOID FACTOR, QL      ALDOLASE      VITAMIN D, 1, 25 DIHYDROXY      MRI BRAIN W WO CONT   4. Paresthesia R20.2 782.0 VITAMIN B12      SED RATE (ESR)      TSH 3RD GENERATION      RHEUMATOID FACTOR, QL      ALDOLASE      VITAMIN D, 1, 25 DIHYDROXY      MRI BRAIN W WO CONT   Plan:  DEPAKOTE ER 528m p.o. Bid  Topamax 243mp.o. qhs  Imitrex 10028m.o. Prn  Prednisone 5mg46mo.bid  EEG  Blood for autoimmune work up.    Follow-up Disposition:  Return in about 6 weeks (around 10/03/2015).      Signed:  CletNelwyn Salisbury  08/22/2015

## 2015-08-22 NOTE — Progress Notes (Signed)
Chief Complaint   Patient presents with   ??? Seizure   ??? New Patient

## 2015-08-22 NOTE — Progress Notes (Signed)
Neurology Consult Note      HISTORY PROVIDED BY: patient    Chief Complaint:   Chief Complaint   Patient presents with   ??? Seizure   ??? New Patient      Subjective:    Connor PlanChristopher Prince is a 30 y.o. right handed male who presents in consultation for seizure    Past Medical History:   Diagnosis Date   ??? Asthma    ??? Chest pain    ??? Cough    ??? Kidney stone    ??? Migraine    ??? Muscle pain    ??? Seizures ([facility])    ??? Skipped beats    ??? SOB (shortness of breath)       History reviewed. No pertinent surgical history.   Social History     Social History   ??? Marital status: SINGLE     Spouse name: N/A   ??? Number of children: N/A   ??? Years of education: N/A     Occupational History   ??? Not on file.     Social History Main Topics   ??? Smoking status: Current Every Day Smoker     Packs/day: 0.25   ??? Smokeless tobacco: Never Used      Comment: approx 2-3   ??? Alcohol use Yes      Comment: Occassionally   ??? Drug use: No   ??? Sexual activity: Not on file     Other Topics Concern   ??? Not on file     Social History Narrative    On disability     Family History   Problem Relation Age of Onset   ??? Cancer Mother      stomach   ??? Migraines Mother    ??? Seizures Mother          Objective:   ROS    No Known Allergies     Meds:  Outpatient Medications Prior to Visit   Medication Sig Dispense Refill   ??? divalproex ER (DEPAKOTE ER) 250 mg ER tablet Take 1 Tab by mouth nightly. 30 Tab 3   ??? divalproex ER (DEPAKOTE ER) 500 mg ER tablet Take 1 Tab by mouth daily (with breakfast). 30 Tab 3     No facility-administered medications prior to visit.        Imaging:  MRI Results (most recent):  No results found for this or any previous visit.   CT Results (most recent):  No results found for this or any previous visit.     Reviewed records in [facility] and media tab today    Lab Review   Results for orders placed or performed during the hospital encounter of 08/05/15   METABOLIC PANEL, BASIC   Result Value Ref Range    Sodium 143 136 - 145 mmol/L     Potassium 4.0 3.5 - 5.1 mmol/L    Chloride 106 97 - 108 mmol/L    CO2 29 21 - 32 mmol/L    Anion gap 8 5 - 15 mmol/L    Glucose 87 65 - 100 mg/dL    BUN 17 6 - 20 MG/DL    Creatinine 9.601.06 4.540.70 - 1.30 MG/DL    BUN/Creatinine ratio 16 12 - 20      GFR est AA >60 >60 ml/min/1.6373m2    GFR est non-AA >60 >60 ml/min/1.2473m2    Calcium 8.7 8.5 - 10.1 MG/DL   CBC WITH AUTOMATED DIFF   Result Value Ref Range  WBC 8.0 4.1 - 11.1 K/uL    RBC 4.10 4.10 - 5.70 M/uL    HGB 10.5 (L) 12.1 - 17.0 g/dL    HCT 09.3 (L) 23.5 - 50.3 %    MCV 79.0 (L) 80.0 - 99.0 FL    MCH 25.6 (L) 26.0 - 34.0 PG    MCHC 32.4 30.0 - 36.5 g/dL    RDW 57.3 22.0 - 25.4 %    PLATELET 213 150 - 400 K/uL    NEUTROPHILS 59 32 - 75 %    LYMPHOCYTES 35 12 - 49 %    MONOCYTES 6 5 - 13 %    EOSINOPHILS 0 0 - 7 %    BASOPHILS 0 0 - 1 %    ABS. NEUTROPHILS 4.7 1.8 - 8.0 K/UL    ABS. LYMPHOCYTES 2.8 0.8 - 3.5 K/UL    ABS. MONOCYTES 0.5 0.0 - 1.0 K/UL    ABS. EOSINOPHILS 0.0 0.0 - 0.4 K/UL    ABS. BASOPHILS 0.0 0.0 - 0.1 K/UL   TROPONIN I   Result Value Ref Range    Troponin-I, Qt. <0.04 <0.05 ng/mL   CK W/ CKMB & INDEX   Result Value Ref Range    CK 302 39 - 308 U/L    CK - MB 1.9 <3.6 NG/ML    CK-MB Index 0.6 0 - 2.5     EKG, 12 LEAD, INITIAL   Result Value Ref Range    Ventricular Rate 78 BPM    Atrial Rate 78 BPM    P-R Interval 128 ms    QRS Duration 86 ms    Q-T Interval 376 ms    QTC Calculation (Bezet) 428 ms    Calculated P Axis 18 degrees    Calculated R Axis 36 degrees    Calculated T Axis 46 degrees    Diagnosis       Normal sinus rhythm  ST elevation, probably due to early repolarization    Confirmed by Merlene Laughter [phone]) on 08/06/2015 12:18:18 PM          Exam:  Visit Vitals   ??? BP 115/65 (BP 1 Location: Right arm, BP Patient Position: Sitting)   ??? Pulse 69   ??? Temp 97.9 ??F (36.6 ??C) (Temporal)   ??? Resp 16   ??? Ht  (1.981 m)   ??? Wt 216 lb 9.6 oz (98.2 kg)   ??? SpO2 98%   ??? BMI 25.03 kg/m2     General:  Alert, cooperative, no distress.    Head:  Normocephalic, without obvious abnormality, atraumatic.   Respiratory:  Heart:   Non labored breathing  Regular rate and rhythm, no murmurs   Neck:   2+ carotids, no bruits   Extremities: Warm, no cyanosis or edema.   Pulses: 2+ radial pulses.       Neurologic:  MS: Alert and oriented x 4, speech intact. Language intact, able to name, repeat, and follow all commands. Attention and fund of knowledge appropriate.  Recent and remote memory intact.  Cranial Nerves:  II: visual fields Full to confrontation   II: pupils Equal, round, reactive to light   II: optic disc No papilledema   III,VII: ptosis none   III,IV,VI: extraocular muscles  EOMI, no nystagmus or diplopia   V: facial light touch sensation  normal   VII: facial muscle function   symmetric   VIII: hearing intact   IX: soft palate elevation  normal   XI: trapezius strength  5/5   XI: sternocleidomastoid strength 5/5   XII: tongue  Midline     Motor: normal bulk and tone, no tremor              Strength: 5/5 throughout, no PD  Sensory: intact to LT, PP  Coordination: FTN and HTS intact, RAM intact,Romberg negative  Gait: normal gait, able to heel, toe, and tandem walk  Reflexes: 2+ symmetric, toes downgoing           Assessment/Prince     No diagnosis found.    Follow-up Disposition:  Return in about 6 weeks (around 10/03/2015).      Signed:  Forrestine Himletus C Rowland Ericsson, MD  08/22/2015

## 2015-08-22 NOTE — Addendum Note (Signed)
Addended by: Forrestine HimARALU, Zaydon Kinser C on: 08/22/2015 03:58 PM      Modules accepted: Orders

## 2015-09-08 NOTE — Progress Notes (Signed)
Complex case management  [facility] record has been reviewed.  The patient has completed Neurology referral and associated diagnostic testing.   Navigator contacted the patient by telephone. Unsuccessful attempts on primary and alternate numbers. The patient is homeless. Therefore a get in touch letter shall not be sent.   Will close current episode at this time. Reassess for case management needs during the next office visit on [id number]

## 2015-09-22 ENCOUNTER — Inpatient Hospital Stay
Admit: 2015-09-22 | Discharge: 2015-09-22 | Disposition: A | Discharge status: Home / Self Care | Payer: PRIVATE HEALTH INSURANCE | Attending: Emergency Medicine

## 2015-09-22 DIAGNOSIS — M940 Chondrocostal junction syndrome [Tietze]: Secondary | ICD-10-CM

## 2015-09-22 LAB — POC TROPONIN-I: Troponin-I (POC): <0.04 ng/mL (ref 0.00–0.08)

## 2015-09-22 MED ORDER — IBUPROFEN 600 MG TAB
600 mg | ORAL_TABLET | Freq: Four times a day (QID) | ORAL | 0 refills | Status: DC | PRN
Start: 2015-09-22 — End: 2015-10-01

## 2015-09-22 MED ORDER — IBUPROFEN 600 MG TAB
600 mg | ORAL | Status: AC
Start: 2015-09-22 — End: 2015-09-22
  Administered 2015-09-22: via ORAL

## 2015-09-22 MED ORDER — ALUMINUM-MAGNESIUM HYDROXIDE 200 MG-200 MG/5 ML ORAL SUSP
200-200 mg/5 mL | ORAL | Status: AC
Start: 2015-09-22 — End: 2015-09-22
  Administered 2015-09-22: 23:00:00 via ORAL

## 2015-09-22 MED FILL — IBUPROFEN 600 MG TAB: 600 mg | ORAL | Qty: 1

## 2015-09-22 MED FILL — MAG-AL 200 MG-200 MG/5 ML ORAL SUSPENSION: 200-200 mg/5 mL | ORAL | Qty: 30

## 2015-09-22 NOTE — ED Notes (Signed)
Emergency Department Nursing Plan of Care       The Nursing Plan of Care is developed from the Nursing assessment and Emergency Department Attending provider initial evaluation.  The plan of care may be reviewed in the ???ED Provider note???.    The Plan of Care was developed with the following considerations:   Patient / Family readiness to learn indicated EX:NTZGYFVCBS understanding  Persons(s) to be included in education: patient  Barriers to Learning/Limitations:No    Signed     Rolena Infante    09/22/2015   6:32 PM

## 2015-09-22 NOTE — ED Provider Notes (Signed)
HPI   To ED with complaints of sharp and throbbing mild/moderate pain in central / left chest. Worse when he twists his chest or lifts arms overhead.  Slight indigestions.  H/o costochondritis. This is somewhat similar.  No injury to chest. No sob. No diaphoresis or nausea. No palpitations. No n/v/d.  No recent seizure.     Past Medical History:   Diagnosis Date   ??? Asthma    ??? Chest pain    ??? Cough    ??? Kidney stone    ??? Migraine    ??? Muscle pain    ??? Seizures (HCC)    ??? Skipped beats    ??? SOB (shortness of breath)        History reviewed. No pertinent surgical history.      Family History:   Problem Relation Age of Onset   ??? Cancer Mother      stomach   ??? Migraines Mother    ??? Seizures Mother        Social History     Social History   ??? Marital status: SINGLE     Spouse name: N/A   ??? Number of children: N/A   ??? Years of education: N/A     Occupational History   ??? Not on file.     Social History Main Topics   ??? Smoking status: Current Every Day Smoker     Packs/day: 0.25   ??? Smokeless tobacco: Never Used      Comment: approx 2-3   ??? Alcohol use Yes      Comment: Occassionally   ??? Drug use: No   ??? Sexual activity: Not on file     Other Topics Concern   ??? Not on file     Social History Narrative    On disability         ALLERGIES: Review of patient's allergies indicates no known allergies.    Review of Systems   Constitutional: Negative for activity change, chills and fever.   HENT: Negative for congestion, mouth sores, rhinorrhea, sore throat and voice change.    Eyes: Negative for pain and discharge.   Respiratory: Negative for cough and shortness of breath.    Cardiovascular: Positive for chest pain.   Gastrointestinal: Negative for abdominal pain, constipation, diarrhea, nausea and vomiting.   Genitourinary: Negative for dysuria, frequency and urgency.   Musculoskeletal: Negative for back pain and neck pain.   Skin: Negative for rash and wound.    Neurological: Positive for seizures (thinks last seizure was several months ago. ). Negative for syncope and headaches.   Psychiatric/Behavioral: Negative for confusion. The patient is not nervous/anxious.    All other systems reviewed and are negative.      Vitals:    09/22/15 1816   BP: 119/73   Pulse: 70   Resp: 16   Temp: 98.8 ??F (37.1 ??C)   SpO2: 100%   Weight: 99.8 kg (220 lb)   Height: 6\' 6"  (1.981 m)            Physical Exam   Constitutional: He is oriented to person, place, and time. He appears well-developed and well-nourished.   HENT:   Head: Normocephalic and atraumatic.   Right Ear: External ear normal.   Left Ear: External ear normal.   Nose: Nose normal.   Mouth/Throat: Oropharynx is clear and moist.   Eyes: Conjunctivae and EOM are normal. Pupils are equal, round, and reactive to light.   Neck: Normal  range of motion. Neck supple.   Cardiovascular: Normal rate, regular rhythm and normal heart sounds.    No murmur heard.  Pulmonary/Chest: Effort normal and breath sounds normal. He has no wheezes. He has no rales. He exhibits tenderness.   Reproducible chest wall tenderness to lower left anterior ribs,   Pain reproducible when twisting thorax.   No rash to chest wall.   No ecchymosis or other skin abnormalities.     No abdominal tenderness.    Abdominal: Soft. Bowel sounds are normal. He exhibits no distension. There is no tenderness. There is no rebound and no guarding.   Musculoskeletal: Normal range of motion.   Neurological: He is alert and oriented to person, place, and time. He has normal reflexes.   Skin: Skin is warm and dry. No rash noted.   Psychiatric: He has a normal mood and affect. His behavior is normal.   Nursing note and vitals reviewed.       MDM  Number of Diagnoses or Management Options  Costochondritis:   Diagnosis management comments: DDX: costochondritis, GERD,   Given normal vital signs, sats of 100%, non acute EKG, low likelihood of serious causes of pain such as PE or ACS       7:16 PM  Feeling better now  Vitals stable.        Amount and/or Complexity of Data Reviewed  Obtain history from someone other than the patient: (Dr. Shari Heritage)      ED Course     ED EKG interpretation:  Rhythm: normal sinus rhythm; and regular . Rate (approx.): 68; Axis: normal; P wave: normal; QRS interval: normal ; ST/T wave: early repol; no change from 08/05/15 EKG This EKG was interpreted by Shanon Payor, PA,ED Provider and Dr. Shari Heritage      Procedures        LABORATORY TESTS:  Recent Results (from the past 12 hour(s))   EKG, 12 LEAD, INITIAL    Collection Time: 09/22/15  6:22 PM   Result Value Ref Range    Ventricular Rate 68 BPM    Atrial Rate 68 BPM    P-R Interval 150 ms    QRS Duration 84 ms    Q-T Interval 380 ms    QTC Calculation (Bezet) 404 ms    Calculated P Axis 41 degrees    Calculated R Axis 40 degrees    Calculated T Axis 47 degrees    Diagnosis       Normal sinus rhythm  Early repolarization  Normal ECG  When compared with ECG of 05-Aug-2015 02:20,  No significant change was found     POC TROPONIN-I    Collection Time: 09/22/15  6:48 PM   Result Value Ref Range    Troponin-I (POC) <0.04 0.00 - 0.08 ng/mL       IMAGING RESULTS:  No orders to display       MEDICATIONS GIVEN:  Medications   ibuprofen (MOTRIN) tablet 600 mg (not administered)   aluminum-magnesium hydroxide (MAALOX) oral suspension 30 mL (30 mL Oral Given 09/22/15 1848)       IMPRESSION:  1. Costochondritis        PLAN:  1.   Current Discharge Medication List      START taking these medications    Details   ibuprofen (MOTRIN) 600 mg tablet Take 1 Tab by mouth every six (6) hours as needed for Pain.  Qty: 20 Tab, Refills: 0         CONTINUE  these medications which have NOT CHANGED    Details   divalproex DR (DEPAKOTE) 500 mg tablet Take 1 Tab by mouth two (2) times a day. Week 1: one tab at bedtime.  Week 2: one tab twice a day.  For headache prevention.  Qty: 60 Tab, Refills: 2       topiramate (TOPAMAX) 25 mg tablet Take 1 Tab by mouth nightly.  Qty: 30 Tab, Refills: 0      SUMAtriptan (IMITREX) 100 mg tablet Take 1 Tab by mouth once as needed for Migraine for up to 1 dose.  Qty: 12 Tab, Refills: 1           2.   Follow-up Information     Follow up With Details Comments Contact Info    Alysha Z. Doreatha Massed, NP Schedule an appointment as soon as possible for a visit in 2 days Follow up with your provider early next week for further management.  [address]  [address] [place] [address]  [phone]      [facility] EMERGENCY DEPT  If symptoms worsen [address]  [place] [address]  [phone]        Return to ED if worse

## 2015-09-22 NOTE — ED Notes (Signed)
Pt resting in room w/ family at bedside, pt given a bag lunch and ginger ale by provider. Pt appears to be in no distress, will continue to monitor pt.

## 2015-09-22 NOTE — ED Notes (Signed)
Patient (s) was given copy of dc instructions and no paper script(s) and one electronic scripts.  Patient (s) has verbalized understanding of instructions and script (s).  Patient given a current medication reconciliation form and verbalized understanding of their medications.   Patient (s) has verbalized understanding of the importance of discussing medications with  his or her physician or clinic they will be following up with.  Patient alert and oriented and in no acute distress.  Patient offered wheelchair from treatment area to hospital entrance, patient declined wheelchair. Patient left ED with family.

## 2015-09-22 NOTE — ED Notes (Signed)
Verbal shift change report given to Noel T Townes, RN (oncoming nurse) by K Guilford, RN (offgoing nurse). Report included the following information SBAR, ED Summary, MAR and Recent Results.

## 2015-09-25 LAB — EKG, 12 LEAD, INITIAL
Atrial Rate: 68 {beats}/min
Calculated P Axis: 41 degrees
Calculated R Axis: 40 degrees
Calculated T Axis: 47 degrees
Diagnosis: NORMAL
P-R Interval: 150 ms
Q-T Interval: 380 ms
QRS Duration: 84 ms
QTC Calculation (Bezet): 404 ms
Ventricular Rate: 68 {beats}/min

## 2015-09-25 LAB — EKG 12-LEAD
Atrial Rate: 68 {beats}/min
Diagnosis: NORMAL
P Axis: 41 degrees
P-R Interval: 150 ms
Q-T Interval: 380 ms
QRS Duration: 84 ms
QTc Calculation (Bazett): 404 ms
R Axis: 40 degrees
T Axis: 47 degrees
Ventricular Rate: 68 {beats}/min

## 2015-10-01 ENCOUNTER — Inpatient Hospital Stay
Admit: 2015-10-01 | Discharge: 2015-10-01 | Disposition: A | Discharge status: Home / Self Care | Payer: PRIVATE HEALTH INSURANCE | Attending: Emergency Medicine

## 2015-10-01 DIAGNOSIS — G40909 Epilepsy, unspecified, not intractable, without status epilepticus: Secondary | ICD-10-CM

## 2015-10-01 LAB — CBC WITH AUTOMATED DIFF
ABS. BASOPHILS: 0 10*3/uL (ref 0.0–0.1)
ABS. EOSINOPHILS: 0 10*3/uL (ref 0.0–0.4)
ABS. LYMPHOCYTES: 2.1 10*3/uL (ref 0.8–3.5)
ABS. MONOCYTES: 0.3 10*3/uL (ref 0.0–1.0)
ABS. NEUTROPHILS: 2.5 10*3/uL (ref 1.8–8.0)
BASOPHILS: 0 % (ref 0–1)
EOSINOPHILS: 0 % (ref 0–7)
HCT: 33.3 % — ABNORMAL LOW (ref 36.6–50.3)
HGB: 10.6 g/dL — ABNORMAL LOW (ref 12.1–17.0)
LYMPHOCYTES: 43 % (ref 12–49)
MCH: 24.4 PG — ABNORMAL LOW (ref 26.0–34.0)
MCHC: 31.8 g/dL (ref 30.0–36.5)
MCV: 76.6 FL — ABNORMAL LOW (ref 80.0–99.0)
MONOCYTES: 7 % (ref 5–13)
NEUTROPHILS: 50 % (ref 32–75)
PLATELET: 184 10*3/uL (ref 150–400)
RBC: 4.35 M/uL (ref 4.10–5.70)
RDW: 12.9 % (ref 11.5–14.5)
WBC: 5 10*3/uL (ref 4.1–11.1)

## 2015-10-01 LAB — METABOLIC PANEL, COMPREHENSIVE
A-G Ratio: 1.2 (ref 1.1–2.2)
ALT (SGPT): 16 U/L (ref 12–78)
AST (SGOT): 12 U/L — ABNORMAL LOW (ref 15–37)
Albumin: 3.7 g/dL (ref 3.5–5.0)
Alk. phosphatase: 56 U/L (ref 45–117)
Anion gap: 9 mmol/L (ref 5–15)
BUN/Creatinine ratio: 15 (ref 12–20)
BUN: 16 MG/DL (ref 6–20)
Bilirubin, total: 0.9 MG/DL (ref 0.2–1.0)
CO2: 27 mmol/L (ref 21–32)
Calcium: 9 MG/DL (ref 8.5–10.1)
Chloride: 104 mmol/L (ref 97–108)
Creatinine: 1.05 MG/DL (ref 0.70–1.30)
GFR est AA: >60 mL/min/{1.73_m2} (ref >60)
GFR est non-AA: >60 mL/min/{1.73_m2} (ref >60)
Globulin: 3 g/dL (ref 2.0–4.0)
Glucose: 84 mg/dL (ref 65–100)
Potassium: 4 mmol/L (ref 3.5–5.1)
Protein, total: 6.7 g/dL (ref 6.4–8.2)
Sodium: 140 mmol/L (ref 136–145)

## 2015-10-01 LAB — DRUG SCREEN, URINE
AMPHETAMINES: NEGATIVE
BARBITURATES: NEGATIVE
BENZODIAZEPINES: NEGATIVE
COCAINE: NEGATIVE
METHADONE: NEGATIVE
OPIATES: NEGATIVE
PCP(PHENCYCLIDINE): NEGATIVE
THC (TH-CANNABINOL): NEGATIVE

## 2015-10-01 LAB — VALPROIC ACID: Valproic acid: 66 ug/ml (ref 50–100)

## 2015-10-01 NOTE — ED Provider Notes (Signed)
HPI Comments: Connor Prince, 30 y.o. male, presents via EMS from home with emergency contact to [facility] ED with cc of witnessed sz which started this morning. Patient's emergency contact states that he witnessed a total of four sz this morning. He reports witnessing 2 sz of typical duration, states that the patient started snoring after the second episode, and then went into 2 more sz with the fourth lasting longer than usual. He states that the patient "balls up" into fetal position and typically comes out of his sz quickly. Patient currently appears postictal during H&P. The patient notes that he takes Depakote 250 mg qs and 50 mg qam. His emergency contact endorses that he is noncompliant with his medication. Patient specifically denies any tongue bites, urinary or fecal incontinence, sore throat, fevers, or recent illnesses.     PCP: Francoise Ceo. Vladimir Crofts, NP    PMHx significant for: sz disorder, asthma, arrhythmia   PSHx significant for: none  Social history significant for: + Tobacco, + (occasional) EtOH, - Illicit Drug Use    There are no other complaints, changes, or physical findings at this time.  Written by Loma Sousa, ED Scribe, as dictated by Etheleen Nicks, MD.     The history is provided by the patient, the EMS personnel and a friend. No language interpreter was used.        Past Medical History:   Diagnosis Date   ??? Asthma    ??? Chest pain    ??? Cough    ??? Kidney stone    ??? Migraine    ??? Muscle pain    ??? Seizures ([place])    ??? Skipped beats    ??? SOB (shortness of breath)        History reviewed. No pertinent surgical history.      Family History:   Problem Relation Age of Onset   ??? Cancer Mother      stomach   ??? Migraines Mother    ??? Seizures Mother        Social History     Social History   ??? Marital status: SINGLE     Spouse name: N/A   ??? Number of children: N/A   ??? Years of education: N/A     Occupational History   ??? Not on file.     Social History Main Topics    ??? Smoking status: Current Some Day Smoker     Packs/day: 0.25   ??? Smokeless tobacco: Never Used      Comment: approx 2-3   ??? Alcohol use Yes      Comment: Occassionally   ??? Drug use: No   ??? Sexual activity: Not on file     Other Topics Concern   ??? Not on file     Social History Narrative    On disability         ALLERGIES: Review of patient's allergies indicates no known allergies.    Review of Systems   Constitutional: Negative for chills and fever.   HENT: Negative for congestion, rhinorrhea, sneezing and sore throat.    Eyes: Negative for redness and visual disturbance.   Respiratory: Negative for shortness of breath.    Cardiovascular: Negative for chest pain and leg swelling.   Gastrointestinal: Negative for abdominal pain, nausea and vomiting.   Genitourinary: Negative for difficulty urinating and frequency.   Musculoskeletal: Negative for back pain, myalgias and neck stiffness.   Skin: Negative for rash.   Neurological: Positive for  seizures. Negative for dizziness, syncope, weakness and headaches.   Hematological: Negative for adenopathy.   All other systems reviewed and are negative.      Vitals:    10/01/15 0930   BP: 127/60   Pulse: 86   Resp: 16   Temp: 97.7 ??F (36.5 ??C)   SpO2: 98%   Weight: 89.1 kg (196 lb 8 oz)   Height: '6\' 6"'$  (1.981 m)            Physical Exam   Constitutional: He is oriented to person, place, and time. He appears well-developed and well-nourished.   somnolent   HENT:   Head: Normocephalic and atraumatic.   Nose: Nose normal.   Mouth/Throat: Oropharynx is clear and moist.   No tongue bites   Eyes: Conjunctivae and EOM are normal. Pupils are equal, round, and reactive to light.   Neck: Normal range of motion. Neck supple.   Cardiovascular: Normal rate, regular rhythm, normal heart sounds and intact distal pulses.    Pulmonary/Chest: Effort normal and breath sounds normal.   Abdominal: Soft. Bowel sounds are normal. He exhibits no distension.    Musculoskeletal: Normal range of motion. He exhibits no edema.   No shoulder injury   Neurological: He is alert and oriented to person, place, and time. He exhibits normal muscle tone.   Skin: Skin is warm and dry. No erythema.   Psychiatric: He has a normal mood and affect. His behavior is normal. Judgment and thought content normal.        MDM  Number of Diagnoses or Management Options  Diagnosis management comments:   Ddx: Sz, conversion rxn, hypoglycemia       Amount and/or Complexity of Data Reviewed  Clinical lab tests: ordered and reviewed  Obtain history from someone other than the patient: yes (EMS, emergency contact)  Review and summarize past medical records: yes    Patient Progress  Patient progress: stable    ED Course       Procedures    LABORATORY TESTS:  Recent Results (from the past 12 hour(s))   CBC WITH AUTOMATED DIFF    Collection Time: 10/01/15  9:40 AM   Result Value Ref Range    WBC 5.0 4.1 - 11.1 K/uL    RBC 4.35 4.10 - 5.70 M/uL    HGB 10.6 (L) 12.1 - 17.0 g/dL    HCT 33.3 (L) 36.6 - 50.3 %    MCV 76.6 (L) 80.0 - 99.0 FL    MCH 24.4 (L) 26.0 - 34.0 PG    MCHC 31.8 30.0 - 36.5 g/dL    RDW 12.9 11.5 - 14.5 %    PLATELET 184 150 - 400 K/uL    NEUTROPHILS 50 32 - 75 %    LYMPHOCYTES 43 12 - 49 %    MONOCYTES 7 5 - 13 %    EOSINOPHILS 0 0 - 7 %    BASOPHILS 0 0 - 1 %    ABS. NEUTROPHILS 2.5 1.8 - 8.0 K/UL    ABS. LYMPHOCYTES 2.1 0.8 - 3.5 K/UL    ABS. MONOCYTES 0.3 0.0 - 1.0 K/UL    ABS. EOSINOPHILS 0.0 0.0 - 0.4 K/UL    ABS. BASOPHILS 0.0 0.0 - 0.1 K/UL   METABOLIC PANEL, COMPREHENSIVE    Collection Time: 10/01/15  9:40 AM   Result Value Ref Range    Sodium 140 136 - 145 mmol/L    Potassium 4.0 3.5 - 5.1 mmol/L    Chloride  104 97 - 108 mmol/L    CO2 27 21 - 32 mmol/L    Anion gap 9 5 - 15 mmol/L    Glucose 84 65 - 100 mg/dL    BUN 16 6 - 20 MG/DL    Creatinine 1.05 0.70 - 1.30 MG/DL    BUN/Creatinine ratio 15 12 - 20      GFR est AA >60 >60 ml/min/1.29m    GFR est non-AA >60 >60 ml/min/1.769m    Calcium 9.0 8.5 - 10.1 MG/DL    Bilirubin, total 0.9 0.2 - 1.0 MG/DL    ALT (SGPT) 16 12 - 78 U/L    AST (SGOT) 12 (L) 15 - 37 U/L    Alk. phosphatase 56 45 - 117 U/L    Protein, total 6.7 6.4 - 8.2 g/dL    Albumin 3.7 3.5 - 5.0 g/dL    Globulin 3.0 2.0 - 4.0 g/dL    A-G Ratio 1.2 1.1 - 2.2     VALPROIC ACID    Collection Time: 10/01/15  9:40 AM   Result Value Ref Range    Valproic acid 66 50 - 100 ug/ml   DRUG SCREEN, URINE    Collection Time: 10/01/15 10:13 AM   Result Value Ref Range    AMPHETAMINES NEGATIVE  NEG      BARBITURATES NEGATIVE  NEG      BENZODIAZEPINE NEGATIVE  NEG      COCAINE NEGATIVE  NEG      METHADONE NEGATIVE  NEG      OPIATES NEGATIVE  NEG      PCP(PHENCYCLIDINE) NEGATIVE  NEG      THC (TH-CANNABINOL) NEGATIVE  NEG      Drug screen comment (NOTE)        IMPRESSION:  1. Seizure disorder ([facility]   2. Seizure ([facility]       PLAN:  1.   Current Discharge Medication List        2.   Follow-up Information     Follow up With Details Comments Contact Info    Alysha Z. NaVladimir CroftsNP Call  [address] [place] [phone]        Return to ED if worse     Discharge Note:  10:58 AM  The pt is ready for discharge. The pt's signs, symptoms, diagnosis, and discharge instructions have been discussed and pt has conveyed their understanding. The pt is to follow up as recommended or return to ER should their symptoms worsen. Plan has been discussed and pt is in agreement.    This note is prepared by SiLoma Sousaacting as a Scribe for ViEtheleen NicksMD.    ViEtheleen NicksMD: The scribe's documentation has been prepared under my direction and personally reviewed by me in its entirety. I confirm that the notes above accurately reflects all work, treatment, procedures, and medical decision making performed by me.

## 2015-10-01 NOTE — ED Notes (Signed)
Pt eating meal tray

## 2015-10-01 NOTE — ED Notes (Signed)
Pt rang call bell and requested something to eat.

## 2015-10-01 NOTE — ED Notes (Signed)
Pt reports he is feeling better.

## 2015-10-01 NOTE — ED Notes (Signed)
Pt given printed discharge instructions and 0 script(s).  Pt verbalized understanding of instructions and the importance of following up with neuro as scheduled for 2 days from now.  Pt alert and oriented, in no acute distress, ambulatory with self.  Patient offered wheelchair from treatment area to hospital entrance, patient declined wheelchair.

## 2015-10-01 NOTE — ED Notes (Signed)
Meal tray ordered for pt.      Emergency Department Nursing Plan of Care       The Nursing Plan of Care is developed from the Nursing assessment and Emergency Department Attending provider initial evaluation.  The plan of care may be reviewed in the ???ED Provider note???.    The Plan of Care was developed with the following considerations:   Patient / Family readiness to learn indicated JM:[id number] understanding  Persons(s) to be included in education: patient  Barriers to Learning/Limitations:No    Signed     Maxie Barb, RN    10/01/2015   10:19 AM

## 2015-10-01 NOTE — ED Triage Notes (Signed)
Pt arrives by EMS, friend at bedside, friend assisting with history, friend reports pt has seizures once a month or so, this morning he had 4 seizures.  Pt's friend reports between the seizures, the pt returned to normal.  Pt declines incontinence, denies biting his tongue.  Pt's friend reports today's seizures are different than his other seizures in that they lasted longer and there were more of them.

## 2015-10-03 ENCOUNTER — Ambulatory Visit: Admit: 2015-10-03 | Discharge: 2015-10-03 | Payer: PRIVATE HEALTH INSURANCE | Attending: Neurology | Primary: Family

## 2015-10-03 DIAGNOSIS — R569 Unspecified convulsions: Secondary | ICD-10-CM

## 2015-10-03 MED ORDER — DIVALPROEX 500 MG TAB, DELAYED RELEASE
500 mg | ORAL_TABLET | Freq: Three times a day (TID) | ORAL | 1 refills | Status: DC
Start: 2015-10-03 — End: 2015-12-31

## 2015-10-03 NOTE — Progress Notes (Signed)
Neurology Progress Note    NAME:  Connor Prince   DOB:   05-Dec-1985   MRN:   [id number]     Date/Time:  10/03/2015  Subjective:      Connor Prince is a 30 y.o. male here today for follow up.  Says he had 4 seizures back to back on 10/01/15, was taken to ER by the Ambulance, in the ER ,apparently nothing was done for the patient,  he probably was back to baseline.Reviewing the ER note, depakote level was therapeutic but no mention of patient getting depakote  In the   ER ,lab work review was unrermarkable.  Marland KitchenHe noted that the seizure came on him without warning.  He said that he did not do anything he knew of that could bring on the seizure, there was also headache associated with seizure.  Admits headache, dizziness, double vision, blurry vision  Denies tongue biting, incontinence,dysphagia, odynophagia,contipation, diarrhea, hematuria,dysuria, hematuria,chest pain, muscle pain, joint pain, numbness and tingling sensation.      Review of Systems:   Neurological ROS: positive for - dizziness, headaches and seizures          Medications reviewed:  Current Outpatient Prescriptions   Medication Sig Dispense Refill   ??? divalproex ER (DEPAKOTE ER) 500 mg ER tablet daily.     ??? ibuprofen (MOTRIN) 600 mg tablet      ??? divalproex DR (DEPAKOTE) 250 mg tablet Take 250 mg by mouth nightly.     ??? topiramate (TOPAMAX) 25 mg tablet Take 1 Tab by mouth nightly. 30 Tab 0   ??? SUMAtriptan (IMITREX) 100 mg tablet Take 1 Tab by mouth once as needed for Migraine for up to 1 dose. 12 Tab 1        Objective:   Vitals:  Vitals:    10/03/15 1604   BP: 112/72   Pulse: 79   Resp: 16   Temp: 97.8 ??F (36.6 ??C)   TempSrc: Oral   SpO2: 99%   Weight: 198 lb 9.6 oz (90.1 kg)   Height:  (1.981 m)   PainSc:   6   PainLoc: Chest               Lab Data Reviewed:  Lab Results   Component Value Date/Time    WBC 5.0 10/01/2015 09:40 AM    HCT 33.3 10/01/2015 09:40 AM    HGB 10.6 10/01/2015 09:40 AM    PLATELET 184 10/01/2015 09:40 AM        Lab Results   Component Value Date/Time    Sodium 140 10/01/2015 09:40 AM    Potassium 4.0 10/01/2015 09:40 AM    Chloride 104 10/01/2015 09:40 AM    CO2 27 10/01/2015 09:40 AM    Glucose 84 10/01/2015 09:40 AM    BUN 16 10/01/2015 09:40 AM    Creatinine 1.05 10/01/2015 09:40 AM    Calcium 9.0 10/01/2015 09:40 AM       No components found for: TROPQUANT    No results found for: ANA      No results found for: HBA1C, HGBE8, HBA1CPOC, HBA1CEXT     No results found for: B12LT, FOL, RBCF    No results found for: ANA, ANARX, ANAIGG, XBANA    No results found for: CHOL, CHOLPOCT, CHOLX, CHLST, CHOLV, HDL, HDLPOC, LDL, LDLCPOC, LDLC, DLDLP, VLDLC, VLDL, TGLX, TRIGL, TRIGP, TGLPOCT, CHHD, CHHDX      CT Results (recent):  No results found for this or any previous visit.  MRI Results (recent):  No results found for this or any previous visit.    IR Results (recent):  No results found for this or any previous visit.    VAS/US Results (recent):  No results found for this or any previous visit.    PHYSICAL EXAM:  General:    Alert, cooperative, no distress, appears stated age.     Head:   Normocephalic, without obvious abnormality, atraumatic.  Eyes:   Conjunctivae/corneas clear.  PERRLA  Nose:  Nares normal. No drainage or sinus tenderness.  Throat:    Lips, mucosa, and tongue normal.  No Thrush  Neck:  Supple, symmetrical,  no adenopathy, thyroid: non tender    no carotid bruit and no JVD.  Back:    Symmetric,  No CVA tenderness.  Lungs:   Clear to auscultation bilaterally.  No Wheezing or Rhonchi. No rales.  Chest wall:  No tenderness or deformity. No Accessory muscle use.  Heart:   Regular rate and rhythm,  no murmur, rub or gallop.  Abdomen:   Soft, non-tender. Not distended.  Bowel sounds normal. No masses  Extremities: Extremities normal, atraumatic, No cyanosis.  No edema. No clubbing  Skin:     Texture, turgor normal. No rashes or lesions.  Not Jaundiced  Lymph nodes: Cervical, supraclavicular normal.   Psych:  Good insight.  Not depressed.  Not anxious or agitated.      NEUROLOGICAL EXAM:  Appearance:  The patient is well developed, well nourished, provides a coherent history and is in no acute distress.   Mental Status: Oriented to time, place and person. Mood and affect appropriate.   Cranial Nerves:   Intact visual fields. Fundi are benign. PERLA, EOM's full, no nystagmus, no ptosis. Facial sensation is normal. Corneal reflexes are intact. Facial movement is symmetric. Hearing is normal bilaterally. Palate is midline with normal sternocleidomastoid and trapezius muscles are normal. Tongue is midline.   Motor:  5/5 strength in upper and lower proximal and distal muscles. Normal bulk and tone. No fasciculations.   Reflexes:   Deep tendon reflexes 2+/4 and symmetrical.   Sensory:   Normal to touch, pinprick and vibration.   Gait:  Normal gait.   Tremor:   No tremor noted.   Cerebellar:  No cerebellar signs present.   Neurovascular:  Normal heart sounds and regular rhythm, peripheral pulses intact, and no carotid bruits.       Assesment  1. Seizures (HCC)  .Question of Pseudoseizures   2. Recurrent seizures (HCC)  Increase  depakote er to 500mg  p.o. qam, 1000mg  p.o. qhs  Will obtain EEG  3. Migraine without aura and without status migrainosus, not intractable    4. Partial symptomatic epilepsy with complex partial seizures, intractable, without status epilepticus (HCC)    ___________________________________________________  PLAN:Medication reviewed with patient.      ICD-10-CM ICD-9-CM    1. Seizures (HCC) R56.9 780.39 divalproex DR (DEPAKOTE) 250 mg tablet      divalproex ER (DEPAKOTE ER) 500 mg ER tablet   2. Recurrent seizures (HCC) G40.909 345.80    3. Migraine without aura and without status migrainosus, not intractable G43.009 346.10    4. Partial symptomatic epilepsy with complex partial seizures, intractable, without status epilepticus (HCC) G40.219 345.41      Follow-up Disposition:   Return in about 2 months (around 12/03/2015).       ___________________________________________________    Total time spent with patient:  15   25   35    __  minutes    Care Plan discussed with:    Patient   Family    Care Manager   Consultant/Specialist :    ___________________________________________________    Attending Physician: Forrestine Him, MD

## 2015-10-03 NOTE — Progress Notes (Signed)
Chief Complaint   Patient presents with   ??? Seizure   ??? Medication Refill     1. Have you been to the ER, urgent care clinic since your last visit?  [facility]    2. Have you seen or consulted any other health care providers outside of [facility] since your last visit?  Include any pap smears or colon screening. No    Patient stated he had 4 seizures on 10/01/15.[facility].

## 2015-12-09 ENCOUNTER — Ambulatory Visit: Admit: 2015-12-09 | Discharge: 2015-12-09 | Payer: PRIVATE HEALTH INSURANCE | Attending: Neurology | Primary: Family

## 2015-12-09 DIAGNOSIS — G40209 Localization-related (focal) (partial) symptomatic epilepsy and epileptic syndromes with complex partial seizures, not intractable, without status epilepticus: Secondary | ICD-10-CM

## 2015-12-09 NOTE — Progress Notes (Signed)
Chief Complaint   Patient presents with   ??? Seizure     1. Have you been to the ER, urgent care clinic since your last visit?  Hospitalized since your last visit? no    2. Have you seen or consulted any other health care providers outside of the [facility] since your last visit?  Include any pap smears or colon screening. no

## 2015-12-09 NOTE — Progress Notes (Signed)
Neurology Progress Note    NAME:  Connor Prince   DOB:   05/13/1985   MRN:   [id number]     Date/Time:  12/09/2015  Subjective:      Connor Prince is a 30 y.o. male here today for follow up for seizure and headache  Says he has been doing fairly well with his seizure, frequency has reduced , once a month, last seizure was a week ago.  Seizure usually triggered by stress or lack of sleep.  Headache has also improved, the frequency is about one to two a month, headache is throbbing in nature, occasionally assocaited with dizziness and nausea.  Review of Systems:   Constitutional: positive for fatigue  Eyes: negative  Ears, nose, mouth, throat, and face: negative  Respiratory: negative  Cardiovascular: negative  Gastrointestinal: negative  Genitourinary:negative  Hematologic/lymphatic: negative  Musculoskeletal:positive for stiff joints, neck pain and back pain  Neurological: positive for headaches, dizziness, seizures, paresthesia and tremor  Behavioral/Psych: positive for anxiety  Endocrine: negative  Allergic/Immunologic: negative  Medications reviewed:  Current Outpatient Prescriptions   Medication Sig Dispense Refill   ??? ibuprofen (MOTRIN) 600 mg tablet      ??? divalproex DR (DEPAKOTE) 500 mg tablet Take 1 Tab by mouth three (3) times daily. Take 2 tablets at night, 1 tablet in morning 90 Tab 1   ??? topiramate (TOPAMAX) 25 mg tablet Take 1 Tab by mouth nightly. 30 Tab 0   ??? SUMAtriptan (IMITREX) 100 mg tablet Take 1 Tab by mouth once as needed for Migraine for up to 1 dose. 12 Tab 1        Objective:   Vitals:  Vitals:    12/09/15 1421   BP: 129/80   Pulse: 70   Resp: 16   Temp: 97.6 ??F (36.4 ??C)   TempSrc: Temporal   SpO2: 99%   Weight: 209 lb 6.4 oz (95 kg)   Height: 6\' 6"  (1.981 m)   PainSc:   0 - No pain     Lab Data Reviewed:  Lab Results   Component Value Date/Time    WBC 5.0 10/01/2015 09:40 AM    HCT 33.3 10/01/2015 09:40 AM    HGB 10.6 10/01/2015 09:40 AM     PLATELET 184 10/01/2015 09:40 AM       Lab Results   Component Value Date/Time    Sodium 140 10/01/2015 09:40 AM    Potassium 4.0 10/01/2015 09:40 AM    Chloride 104 10/01/2015 09:40 AM    CO2 27 10/01/2015 09:40 AM    Glucose 84 10/01/2015 09:40 AM    BUN 16 10/01/2015 09:40 AM    Creatinine 1.05 10/01/2015 09:40 AM    Calcium 9.0 10/01/2015 09:40 AM       No components found for: TROPQUANT    No results found for: ANA      No results found for: HBA1C, HGBE8, HBA1CPOC, HBA1CEXT     No results found for: B12LT, FOL, RBCF    No results found for: ANA, ANARX, ANAIGG, XBANA    No results found for: CHOL, CHOLPOCT, CHOLX, CHLST, CHOLV, HDL, HDLPOC, LDL, LDLCPOC, LDLC, DLDLP, VLDLC, VLDL, TGLX, TRIGL, TRIGP, TGLPOCT, CHHD, CHHDX      CT Results (recent):  No results found for this or any previous visit.    MRI Results (recent):  No results found for this or any previous visit.    IR Results (recent):  No results found for this or any previous visit.  VAS/US Results (recent):  No results found for this or any previous visit.    PHYSICAL EXAM:  General:    Alert, cooperative, no distress, appears stated age.     Head:   Normocephalic, without obvious abnormality, atraumatic.  Eyes:   Conjunctivae/corneas clear.  PERRLA  Nose:  Nares normal. No drainage or sinus tenderness.  Throat:    Lips, mucosa, and tongue normal.  No Thrush  Neck:  Supple, symmetrical,  no adenopathy, thyroid: non tender    no carotid bruit and no JVD.  Back:    Symmetric,  No CVA tenderness.  Lungs:   Clear to auscultation bilaterally.  No Wheezing or Rhonchi. No rales.  Chest wall:  No tenderness or deformity. No Accessory muscle use.  Heart:   Regular rate and rhythm,  no murmur, rub or gallop.  Abdomen:   Soft, non-tender. Not distended.  Bowel sounds normal. No masses  Extremities: Extremities normal, atraumatic, No cyanosis.  No edema. No clubbing  Skin:     Texture, turgor normal. No rashes or lesions.  Not Jaundiced   Lymph nodes: Cervical, supraclavicular normal.  Psych:  Good insight.  Not depressed.  Not anxious or agitated.      NEUROLOGICAL EXAM:  Appearance:  The patient is well developed, well nourished, provides a coherent history and is in no acute distress.   Mental Status: Oriented to time, place and person. Mood and affect appropriate.   Cranial Nerves:   Intact visual fields. Fundi are benign. PERLA, EOM's full, no nystagmus, no ptosis. Facial sensation is normal. Corneal reflexes are intact. Facial movement is symmetric. Hearing is normal bilaterally. Palate is midline with normal sternocleidomastoid and trapezius muscles are normal. Tongue is midline.   Motor:  5/5 strength in upper and lower proximal and distal muscles. Normal bulk and tone. No fasciculations.   Reflexes:   Deep tendon reflexes 2+/4 and symmetrical.   Sensory:   Normal to touch, pinprick and vibration.   Gait:  Normal gait.   Tremor:   No tremor noted.   Cerebellar:  No cerebellar signs present.   Neurovascular:  Normal heart sounds and regular rhythm, peripheral pulses intact, and no carotid bruits.       Assesment  1. Partial symptomatic epilepsy with complex partial seizures, not intractable, without status epilepticus (HCC)  Continue management    2. Tonic clonic convulsion (HCC)  Continue management    3. Migraine without aura and without status migrainosus, not intractable  Continue management    ___________________________________________________  PLAN:      ICD-10-CM ICD-9-CM    1. Partial symptomatic epilepsy with complex partial seizures, not intractable, without status epilepticus (HCC) G40.209 345.40    2. Tonic clonic convulsion (HCC) G40.409 780.39    3. Migraine without aura and without status migrainosus, not intractable G43.009 346.10      Follow-up Disposition:  Return in about 3 months (around 03/10/2016).         ___________________________________________________    Total time spent with patient:  15   25   35    __ minutes     Care Plan discussed with:    Patient   Family    Care Manager   Consultant/Specialist :    ___________________________________________________    Attending Physician: Forrestine Him, MD

## 2015-12-28 DEATH — deceased

## 2015-12-31 ENCOUNTER — Inpatient Hospital Stay
Admit: 2015-12-31 | Discharge: 2015-12-31 | Disposition: A | Discharge status: Home / Self Care | Payer: PRIVATE HEALTH INSURANCE | Attending: Emergency Medicine

## 2015-12-31 DIAGNOSIS — Z760 Encounter for issue of repeat prescription: Secondary | ICD-10-CM

## 2015-12-31 MED ORDER — DIVALPROEX 500 MG TAB, DELAYED RELEASE
500 mg | ORAL_TABLET | ORAL | 0 refills | Status: DC
Start: 2015-12-31 — End: 2016-01-30

## 2015-12-31 NOTE — ED Provider Notes (Signed)
Patient is a 30 y.o. male presenting with medication refill. The history is provided by the patient.   Medication Refill   This is a new (Pt endorses runnign out of his Divalproex today. Takes 1 in teh morhning and 2 at night since his NEurologist, Dr. Lennart PallAralu, increased the dose recently. Pt states he did not receive a refill after the dose was increased. Has not contacted his PCP/ Dr. Lennart PallAralu.) problem. The current episode started 1 to 2 hours ago. The problem occurs constantly. The problem has not changed since onset.Pertinent negatives include no chest pain, no abdominal pain, no headaches and no shortness of breath. Associated symptoms comments: Last seizure 2 weeks ago. No fu.. Nothing aggravates the symptoms. Nothing relieves the symptoms. He has tried nothing for the symptoms.        Past Medical History:   Diagnosis Date   ??? Asthma    ??? Chest pain    ??? Cough    ??? Kidney stone    ??? Migraine    ??? Muscle pain    ??? Seizures (HCC)    ??? Skipped beats    ??? SOB (shortness of breath)        History reviewed. No pertinent surgical history.      Family History:   Problem Relation Age of Onset   ??? Cancer Mother      stomach   ??? Migraines Mother    ??? Seizures Mother        Social History     Social History   ??? Marital status: SINGLE     Spouse name: N/A   ??? Number of children: N/A   ??? Years of education: N/A     Occupational History   ??? Not on file.     Social History Main Topics   ??? Smoking status: Current Some Day Smoker     Packs/day: 0.25   ??? Smokeless tobacco: Never Used      Comment: approx 2-3   ??? Alcohol use Yes      Comment: Occassionally   ??? Drug use: No   ??? Sexual activity: Not on file     Other Topics Concern   ??? Not on file     Social History Narrative    On disability         ALLERGIES: Review of patient's allergies indicates no known allergies.    Review of Systems   Constitutional: Negative for activity change, appetite change, chills, diaphoresis, fatigue and fever.    HENT: Negative for congestion, dental problem, ear discharge, ear pain, facial swelling, postnasal drip, sinus pressure and sneezing.    Eyes: Negative for photophobia, pain, redness and visual disturbance.   Respiratory: Negative for cough and shortness of breath.    Cardiovascular: Negative for chest pain and palpitations.   Gastrointestinal: Negative for abdominal pain, constipation, diarrhea, nausea and vomiting.   Genitourinary: Negative.    Musculoskeletal: Negative.    Skin: Negative.  Negative for pallor and rash.   Neurological: Negative for dizziness, tremors, seizures, syncope, facial asymmetry, speech difficulty, weakness, light-headedness, numbness and headaches.   Psychiatric/Behavioral: Negative.        Vitals:    12/31/15 1240   BP: 120/73   Pulse: 86   Resp: 15   Temp: 98.6 ??F (37 ??C)   SpO2: 98%   Weight: 91.2 kg (201 lb)   Height: 6\' 6"  (1.981 m)            Physical Exam  Constitutional: He is oriented to person, place, and time. He appears well-developed and well-nourished. No distress.   HENT:   Head: Normocephalic and atraumatic.   Right Ear: Hearing and external ear normal.   Left Ear: Hearing and external ear normal.   Nose: Nose normal.   Eyes: Conjunctivae and EOM are normal. Pupils are equal, round, and reactive to light.   Neck: Normal range of motion.   Pulmonary/Chest: Effort normal. No accessory muscle usage. No respiratory distress.   Musculoskeletal: Normal range of motion.   Neurological: He is alert and oriented to person, place, and time.   Skin: Skin is warm, dry and intact. He is not diaphoretic. No pallor.   Psychiatric: He has a normal mood and affect. His speech is normal and behavior is normal. Judgment and thought content normal.   Nursing note and vitals reviewed.       MDM  Number of Diagnoses or Management Options  Medication refill:   Diagnosis management comments: DDx; medication refill, seizure disorder    LABORATORY TESTS:   No results found for this or any previous visit (from the past 12 hour(s)).    IMAGING RESULTS:  No orders to display    MEDICATIONS GIVEN:  Medications - No data to display    IMPRESSION:  Medication refill  (primary encounter diagnosis)    PLAN:  1. Current Discharge Medication List    CONTINUE these medications which have CHANGED    divalproex DR (DEPAKOTE) 500 mg tablet  Take 2 tablets at night, 1 tablet in morning.  Qty: 90 Tab Refills: 0      CONTINUE these medications which have NOT CHANGED    ibuprofen (MOTRIN) 600 mg tablet      topiramate (TOPAMAX) 25 mg tablet  Take 1 Tab by mouth nightly.  Qty: 30 Tab Refills: 0    SUMAtriptan (IMITREX) 100 mg tablet  Take 1 Tab by mouth once as needed for Migraine for up to 1 dose.  Qty: 12 Tab Refills: 1        2. Follow-up Information     Follow up With Details Comments Contact Info    Cletus C Aralu, MD Schedule an appointment as soon as possible for a   visit in 2 days As needed [address]  [place] [id number]  [phone]        Return to ED if worse                  Amount and/or Complexity of Data Reviewed  Discuss the patient with other providers: yes (I reviewed pt's hx, sxs, vitals, and PE w/ attending, Dr. Lenox Pondsenglish. He is in agreement with the care plan.  )    Patient Progress  Patient progress: stable    ED Course       Procedures    1:16 PM  I have discussed with patient their diagnosis, treatment, and follow up plan. The patient agrees to follow up as outlined in discharge paperwork and also to return to the ED with any worsening. Sunday ShamsSara E. Duvall, PA-C    Educated pt on fu with Dr. Lennart PallAralu.

## 2015-12-31 NOTE — ED Notes (Cosign Needed)
Pt came to the ED because he "needs a refill for" his current medication.   ..  Emergency Department Nursing Plan of Care       The Nursing Plan of Care is developed from the Nursing assessment and Emergency Department Attending provider initial evaluation.  The plan of care may be reviewed in the ???ED Provider note???.    The Plan of Care was developed with the following considerations:   Patient / Family readiness to learn indicated [id number]:verbalized understanding  Persons(s) to be included in education: patient  Barriers to Learning/Limitations:No    Signed     Garth BignessMegan K Coster    12/31/2015   1:28 PM

## 2015-12-31 NOTE — ED Notes (Signed)
Prescribed 60 pills 12/08/15  Was to be taken once per day for a week and then BID after.  Pt ran out early.  Pt reports that the was taking one pill in the morning and 2 pills at night so pt ran out eearly.  Pt appears to [facility].

## 2015-12-31 NOTE — ED Notes (Cosign Needed)
....  Discharge summary and discharge medications reviewed with patient and appropriate educational materials and side effects teaching were provided.  patient  Given 1 paper prescriptions and 0 electronic prescriptions sent to pt's listed pharmacy.Patient verbalized understanding of the importance of discussing medications with his or her physician or clinic they will be following up with. No si/s of acute distress prior to discharge.Patient offered wheelchair from treatment area to hospital entrance, patient declined wheelchair.

## 2016-01-02 ENCOUNTER — Emergency Department: Admit: 2016-01-03 | Payer: PRIVATE HEALTH INSURANCE | Primary: Family

## 2016-01-02 DIAGNOSIS — R0789 Other chest pain: Secondary | ICD-10-CM

## 2016-01-02 NOTE — ED Notes (Signed)
Patient (s)  given copy of dc instructions and 0 paper script(s) and 1 electronic scripts.  Patient (s)  verbalized understanding of instructions and script (s).  Patient given a current medication reconciliation form and verbalized understanding of their medications.   Patient (s) verbalized understanding of the importance of discussing medications with  his or her physician or clinic they will be following up with.  Patient alert and oriented and in no acute distress.  Patient offered wheelchair from treatment area to hospital entrance, patient declined wheelchair.

## 2016-01-02 NOTE — ED Provider Notes (Signed)
[facility]  EMERGENCY DEPARTMENT HISTORY AND PHYSICAL EXAM         Date of Service: 01/02/2016   Patient Name: Connor Prince   Date of Birth: 1986-02-27  Medical Record Number: [id number]    History of Presenting Illness     Chief Complaint   Patient presents with   ??? Headache     c/o pain to head " all day because I haven't had anything to eat all day". Also c/o " chest pain", worse with movement and palpation.         History Provided By:  patient    Additional History:   Connor Prince is a 30 y.o. male with PMhx significant for seizures, asthma, migraines, kidney stones, chest pain, and SOB who presents via EMS to the ED with cc of sharp chest pain which started 20 minutes PTA.  He rates his pain as an 8 out of 10. Pt states his chest pain started suddenly while sitting and talking with his friends.  He also c/o mild SOB and headache.  Pt states he has never had similar symptoms.  He explains that he believes he has a headache because he has not eaten today.  Pt reports no history of cardiac problems.  He currently smokes socially.  Pt is otherwise without complaint.  He reports no modifying factors.    Social Hx: + Tobacco, + EtOH, - Illicit Drugs    There are no other complaints, changes or physical findings at this time.    Primary Care Provider: Zannie Cove. Doreatha Massed, NP     Past History     Past Medical History:   Past Medical History:   Diagnosis Date   ??? Asthma    ??? Chest pain    ??? Cough    ??? Kidney stone    ??? Migraine    ??? Muscle pain    ??? Seizures (HCC)    ??? Skipped beats    ??? SOB (shortness of breath)         Past Surgical History:   History reviewed. No pertinent surgical history.     Family History:   Family History   Problem Relation Age of Onset   ??? Cancer Mother      stomach   ??? Migraines Mother    ??? Seizures Mother         Social History:   Social History   Substance Use Topics   ??? Smoking status: Current Some Day Smoker     Packs/day: 0.25    ??? Smokeless tobacco: Never Used      Comment: approx 2-3   ??? Alcohol use Yes      Comment: Occassionally        Allergies:   No Known Allergies     Review of Systems   Review of Systems   Constitutional: Negative for fever.   HENT: Negative for sore throat and trouble swallowing.    Eyes: Negative for photophobia and redness.   Respiratory: Negative for cough and shortness of breath (mild).    Cardiovascular: Positive for chest pain. Negative for leg swelling.   Gastrointestinal: Negative for abdominal pain, constipation, diarrhea, nausea and vomiting.   Endocrine: Negative for polydipsia and polyuria.   Genitourinary: Negative for dysuria, hematuria and scrotal swelling.   Musculoskeletal: Negative for back pain and joint swelling.   Skin: Negative for rash.   Neurological: Positive for headaches. Negative for dizziness, syncope and weakness.   Psychiatric/Behavioral:  Negative for suicidal ideas.   All other systems reviewed and are negative.    Physical Exam  Physical Exam   Constitutional: He is oriented to person, place, and time. He appears well-developed and well-nourished. No distress.   HENT:   Head: Normocephalic and atraumatic.   Mouth/Throat: Oropharynx is clear and moist. No oropharyngeal exudate.   Eyes: Conjunctivae and EOM are normal. Pupils are equal, round, and reactive to light. Left eye exhibits no discharge.   Neck: Normal range of motion. Neck supple. No JVD present.   Cardiovascular: Normal rate, regular rhythm, normal heart sounds and intact distal pulses.    Pulmonary/Chest: Effort normal and breath sounds normal. No respiratory distress. He has no wheezes.   Abdominal: Soft. Bowel sounds are normal. He exhibits no distension. There is no tenderness. There is no rebound and no guarding.   Musculoskeletal: Normal range of motion. He exhibits no edema or tenderness.   Lymphadenopathy:     He has no cervical adenopathy.   Neurological: He is alert and oriented to person, place, and time. He has  normal reflexes. No cranial nerve deficit.   Skin: Skin is warm and dry. No rash noted.   Psychiatric: He has a normal mood and affect. His behavior is normal.   Nursing note and vitals reviewed.    Medical Decision Making   I am the first provider for this patient.     I reviewed the vital signs, available nursing notes, past medical history, past surgical history, family history and social history.     Provider Notes: DDx: acute coronary syndrome, PE, pneumonia, MI, costochondritis     ED Course:  8:16 PM   Initial assessment performed. The patients presenting problems have been discussed, and they are in agreement with the care plan formulated and outlined with them.  I have encouraged them to ask questions as they arise throughout their visit.    EKG interpretation: (Preliminary) 2033  Rhythm: normal sinus rhythm; and regular . Rate (approx.): 69; Axis: normal; PR interval: normal; QRS interval: normal ; ST/T wave: normal, Other findings: early repolarization  Written by Delight Hoh Andy Hesse, ED Scribe as dictated by Belva CromePedro Kieana Livesay, MD    Progress Notes:  9:40 PM  Patient is ready for discharge.    Diagnostic Study Results   Labs -      Recent Results (from the past 12 hour(s))   EKG, 12 LEAD, INITIAL    Collection Time: 01/02/16  8:33 PM   Result Value Ref Range    Ventricular Rate 69 BPM    Atrial Rate 69 BPM    P-R Interval 138 ms    QRS Duration 92 ms    Q-T Interval 380 ms    QTC Calculation (Bezet) 407 ms    Calculated P Axis 39 degrees    Calculated R Axis 55 degrees    Calculated T Axis 48 degrees    Diagnosis       Normal sinus rhythm  Minimal voltage criteria for LVH, may be normal variant  Early repolarization  Borderline ECG  When compared with ECG of 22-Sep-2015 18:22,  No significant change was found     CBC WITH AUTOMATED DIFF    Collection Time: 01/02/16  9:08 PM   Result Value Ref Range    WBC 7.4 4.1 - 11.1 K/uL    RBC 4.57 4.10 - 5.70 M/uL    HGB 11.1 (L) 12.1 - 17.0 g/dL    HCT 02.735.2 (L) 25.336.6 -  50.3 %     MCV 77.0 (L) 80.0 - 99.0 FL    MCH 24.3 (L) 26.0 - 34.0 PG    MCHC 31.5 30.0 - 36.5 g/dL    RDW 16.1 (H) 09.6 - 14.5 %    PLATELET 204 150 - 400 K/uL    NEUTROPHILS 58 32 - 75 %    LYMPHOCYTES 35 12 - 49 %    MONOCYTES 6 5 - 13 %    EOSINOPHILS 1 0 - 7 %    BASOPHILS 0 0 - 1 %    ABS. NEUTROPHILS 4.3 1.8 - 8.0 K/UL    ABS. LYMPHOCYTES 2.6 0.8 - 3.5 K/UL    ABS. MONOCYTES 0.4 0.0 - 1.0 K/UL    ABS. EOSINOPHILS 0.1 0.0 - 0.4 K/UL    ABS. BASOPHILS 0.0 0.0 - 0.1 K/UL   METABOLIC PANEL, BASIC    Collection Time: 01/02/16  9:08 PM   Result Value Ref Range    Sodium 140 136 - 145 mmol/L    Potassium 3.9 3.5 - 5.1 mmol/L    Chloride 102 97 - 108 mmol/L    CO2 32 21 - 32 mmol/L    Anion gap 6 5 - 15 mmol/L    Glucose 96 65 - 100 mg/dL    BUN 19 6 - 20 MG/DL    Creatinine 0.45 4.09 - 1.30 MG/DL    BUN/Creatinine ratio 18 12 - 20      GFR est AA >60 >60 ml/min/1.60m2    GFR est non-AA >60 >60 ml/min/1.89m2    Calcium 9.2 8.5 - 10.1 MG/DL   TROPONIN I    Collection Time: 01/02/16  9:08 PM   Result Value Ref Range    Troponin-I, Qt. <0.04 <0.05 ng/mL       Radiologic Studies -  The following have been ordered and reviewed:    CXR Results  (Last 48 hours)               01/02/16 2043  XR CHEST PA LAT Final result    Impression:  IMPRESSION: No acute cardiopulmonary disease.           Narrative:  Indication: Shortness of breath, headache all day. Chest pain, worse with   movement.       Exam: PA and lateral views of the chest.       Direct comparison is made to prior CXR dated August 04, 2015.       Findings: Cardiomediastinal silhouette is within normal limits. Lungs are clear   bilaterally. Pleural spaces are normal. Osseous structures are intact.               Vital Signs-Reviewed the patient's vital signs.   Patient Vitals for the past 12 hrs:   Temp Pulse Resp BP SpO2   01/02/16 2012 98 ??F (36.7 ??C) 80 18 126/73 100 %       Medications Given in the ED:  Medications   sodium chloride (NS) flush 5-10 mL (not administered)    sodium chloride (NS) flush 5-10 mL (not administered)   sodium chloride 0.9 % bolus infusion 1,000 mL (1,000 mL IntraVENous New Bag 01/02/16 2123)   ketorolac (TORADOL) injection 30 mg (30 mg IntraVENous Given 01/02/16 2123)       Diagnosis:  Clinical Impression:   1. Atypical chest pain    2. Tension headache         Plan:  1:   Follow-up Information  Follow up With Details Comments Contact Info    Alysha Z. Doreatha MassedNakoneczny, NP In 1 week  [address]  [address]  [place] [id number]  [phone]            2:   Current Discharge Medication List      START taking these medications    Details   !! ibuprofen (MOTRIN) 800 mg tablet Take 1 Tab by mouth every six (6) hours as needed for Pain for up to 7 days.  Qty: 20 Tab, Refills: 0       !! - Potential duplicate medications found. Please discuss with provider.      CONTINUE these medications which have NOT CHANGED    Details   !! ibuprofen (MOTRIN) 600 mg tablet        !! - Potential duplicate medications found. Please discuss with provider.        Return to ED if worse.     Disposition:  DISCHARGE NOTE  9:39 PM  The patient has been re-evaluated and is ready for discharge. Reviewed available results with patient. Counseled patient on diagnosis and care plan. Patient has expressed understanding, and all questions have been answered. Patient agrees with plan and agrees to follow up as recommended, or return to the ED if their symptoms worsen. Discharge instructions have been provided and explained to the patient, along with reasons to return to the ED.  _____________________     Attestations:     Attestations:   Attestation Note:  This note is prepared by R. Darcey NoraAndy Hesse, acting as Neurosurgeoncribe for Belva CromePedro Jarris Kortz, MD    Belva CromePedro Jacob Chamblee, MD: The scribe's documentation has been prepared under my direction and personally reviewed by me in its entirety. I confirm that the note above accurately reflects all work, treatment, procedures, and medical decision making performed by me.   _______________________________

## 2016-01-03 ENCOUNTER — Inpatient Hospital Stay
Admit: 2016-01-03 | Discharge: 2016-01-03 | Disposition: A | Discharge status: Home / Self Care | Payer: PRIVATE HEALTH INSURANCE | Attending: Emergency Medicine

## 2016-01-03 LAB — EKG, 12 LEAD, INITIAL
Atrial Rate: 69 {beats}/min
Calculated P Axis: 39 degrees
Calculated R Axis: 55 degrees
Calculated T Axis: 48 degrees
Diagnosis: NORMAL
P-R Interval: 138 ms
Q-T Interval: 380 ms
QRS Duration: 92 ms
QTC Calculation (Bezet): 407 ms
Ventricular Rate: 69 {beats}/min

## 2016-01-03 LAB — METABOLIC PANEL, BASIC
Anion gap: 6 mmol/L (ref 5–15)
BUN/Creatinine ratio: 18 (ref 12–20)
BUN: 19 MG/DL (ref 6–20)
CO2: 32 mmol/L (ref 21–32)
Calcium: 9.2 MG/DL (ref 8.5–10.1)
Chloride: 102 mmol/L (ref 97–108)
Creatinine: 1.04 MG/DL (ref 0.70–1.30)
GFR est AA: >60 mL/min/{1.73_m2} (ref >60)
GFR est non-AA: >60 mL/min/{1.73_m2} (ref >60)
Glucose: 96 mg/dL (ref 65–100)
Potassium: 3.9 mmol/L (ref 3.5–5.1)
Sodium: 140 mmol/L (ref 136–145)

## 2016-01-03 LAB — TROPONIN I: Troponin-I, Qt.: <0.04 ng/mL (ref <0.05)

## 2016-01-03 LAB — CBC WITH AUTOMATED DIFF
ABS. BASOPHILS: 0 10*3/uL (ref 0.0–0.1)
ABS. EOSINOPHILS: 0.1 10*3/uL (ref 0.0–0.4)
ABS. LYMPHOCYTES: 2.6 10*3/uL (ref 0.8–3.5)
ABS. MONOCYTES: 0.4 10*3/uL (ref 0.0–1.0)
ABS. NEUTROPHILS: 4.3 10*3/uL (ref 1.8–8.0)
BASOPHILS: 0 % (ref 0–1)
EOSINOPHILS: 1 % (ref 0–7)
HCT: 35.2 % — ABNORMAL LOW (ref 36.6–50.3)
HGB: 11.1 g/dL — ABNORMAL LOW (ref 12.1–17.0)
LYMPHOCYTES: 35 % (ref 12–49)
MCH: 24.3 PG — ABNORMAL LOW (ref 26.0–34.0)
MCHC: 31.5 g/dL (ref 30.0–36.5)
MCV: 77 FL — ABNORMAL LOW (ref 80.0–99.0)
MONOCYTES: 6 % (ref 5–13)
NEUTROPHILS: 58 % (ref 32–75)
PLATELET: 204 10*3/uL (ref 150–400)
RBC: 4.57 M/uL (ref 4.10–5.70)
RDW: 15.5 % — ABNORMAL HIGH (ref 11.5–14.5)
WBC: 7.4 10*3/uL (ref 4.1–11.1)

## 2016-01-03 LAB — EKG 12-LEAD
Atrial Rate: 69 {beats}/min
Diagnosis: NORMAL
P Axis: 39 degrees
P-R Interval: 138 ms
Q-T Interval: 380 ms
QRS Duration: 92 ms
QTc Calculation (Bazett): 407 ms
R Axis: 55 degrees
T Axis: 48 degrees
Ventricular Rate: 69 {beats}/min

## 2016-01-03 MED ORDER — KETOROLAC TROMETHAMINE 30 MG/ML INJECTION
30 mg/mL (1 mL) | INTRAMUSCULAR | Status: AC
Start: 2016-01-03 — End: 2016-01-02
  Administered 2016-01-03: 02:00:00 via INTRAVENOUS

## 2016-01-03 MED ORDER — SODIUM CHLORIDE 0.9% BOLUS IV
0.9 % | Freq: Once | INTRAVENOUS | Status: AC
Start: 2016-01-03 — End: 2016-01-02
  Administered 2016-01-03: 02:00:00 via INTRAVENOUS

## 2016-01-03 MED ORDER — SODIUM CHLORIDE 0.9 % IJ SYRG
Freq: Three times a day (TID) | INTRAMUSCULAR | Status: DC
Start: 2016-01-03 — End: 2016-01-03

## 2016-01-03 MED ORDER — SODIUM CHLORIDE 0.9 % IJ SYRG
INTRAMUSCULAR | Status: DC | PRN
Start: 2016-01-03 — End: 2016-01-03

## 2016-01-03 MED ORDER — IBUPROFEN 800 MG TAB
800 mg | ORAL_TABLET | Freq: Four times a day (QID) | ORAL | 0 refills | Status: AC | PRN
Start: 2016-01-03 — End: 2016-01-09

## 2016-01-03 MED FILL — KETOROLAC TROMETHAMINE 30 MG/ML INJECTION: 30 mg/mL (1 mL) | INTRAMUSCULAR | Qty: 1

## 2016-01-03 MED FILL — SODIUM CHLORIDE 0.9 % IV: INTRAVENOUS | Qty: 1000

## 2016-01-04 ENCOUNTER — Inpatient Hospital Stay
Admit: 2016-01-04 | Discharge: 2016-01-04 | Disposition: A | Discharge status: Home / Self Care | Payer: PRIVATE HEALTH INSURANCE | Attending: Emergency Medicine

## 2016-01-04 DIAGNOSIS — G40909 Epilepsy, unspecified, not intractable, without status epilepticus: Secondary | ICD-10-CM

## 2016-01-04 LAB — METABOLIC PANEL, BASIC
Anion gap: 6 mmol/L (ref 5–15)
BUN/Creatinine ratio: 20 (ref 12–20)
BUN: 19 MG/DL (ref 6–20)
CO2: 28 mmol/L (ref 21–32)
Calcium: 8.8 MG/DL (ref 8.5–10.1)
Chloride: 104 mmol/L (ref 97–108)
Creatinine: 0.95 MG/DL (ref 0.70–1.30)
GFR est AA: >60 mL/min/{1.73_m2} (ref >60)
GFR est non-AA: >60 mL/min/{1.73_m2} (ref >60)
Glucose: 84 mg/dL (ref 65–100)
Potassium: 4.4 mmol/L (ref 3.5–5.1)
Sodium: 138 mmol/L (ref 136–145)

## 2016-01-04 LAB — DRUG SCREEN, URINE
AMPHETAMINES: NEGATIVE
BARBITURATES: NEGATIVE
BENZODIAZEPINES: NEGATIVE
COCAINE: NEGATIVE
METHADONE: NEGATIVE
OPIATES: NEGATIVE
PCP(PHENCYCLIDINE): NEGATIVE
THC (TH-CANNABINOL): NEGATIVE

## 2016-01-04 LAB — VALPROIC ACID: Valproic acid: 99 ug/ml (ref 50–100)

## 2016-01-04 LAB — MAGNESIUM: Magnesium: 2 mg/dL (ref 1.6–2.4)

## 2016-01-04 MED ORDER — SODIUM CHLORIDE 0.9% BOLUS IV
0.9 % | Freq: Once | INTRAVENOUS | Status: AC
Start: 2016-01-04 — End: 2016-01-04
  Administered 2016-01-04: 06:00:00 via INTRAVENOUS

## 2016-01-04 MED ORDER — LORAZEPAM 2 MG/ML IJ SOLN
2 mg/mL | INTRAMUSCULAR | Status: AC
Start: 2016-01-04 — End: 2016-01-04
  Administered 2016-01-04: 07:00:00 via INTRAVENOUS

## 2016-01-04 MED FILL — SODIUM CHLORIDE 0.9 % IV: INTRAVENOUS | Qty: 1000

## 2016-01-04 MED FILL — LORAZEPAM 2 MG/ML IJ SOLN: 2 mg/mL | INTRAMUSCULAR | Qty: 1

## 2016-01-04 NOTE — ED Notes (Signed)
Patient has been instructed that they have been given Ativan* which contains opioids, benzodiazepines, or other sedating drugs. Patient is aware that they  will need to refrain from driving or operating heavy machinery after taking this medication.  Patient also instructed that they need to avoid drinking alcohol and using other products containing opioids, benzodiazepines, or other sedating drugs.  Patient verbalized understanding. Pt left ED accompanied by roommate/caregiver.    Discharge instructions were given to the patient by Rolm BaptiseErin Knudtson, RN.     The patient left the Emergency Department ambulatory, alert and oriented and in no acute distress with 0 prescriptions. The patient was encouraged to call or return to the ED for worsening issues or problems and was encouraged to schedule a follow up appointment for continuing care.     The patient verbalized understanding of discharge instructions and prescriptions, all questions were answered. The patient has no further concerns at this time.

## 2016-01-04 NOTE — ED Provider Notes (Signed)
[facility]  EMERGENCY DEPARTMENT HISTORY AND PHYSICAL EXAM         Date of Service: 01/04/2016   Patient Name: Connor Prince   Date of Birth: 1985-11-03  Medical Record Number: [id number]    History of Presenting Illness     Chief Complaint   Patient presents with   ??? Seizure      History Provided By:  patient and EMS    Additional History:   Connor PlanChristopher Prince is a 30 y.o. male with PMhx significant for seizures, migraines, and borderline DM who presents via EMS to the ED with cc of a seizure that occurred 10-15 minutes PTA. Per EMS, the seizure was witnessed by a friend. It lasted 1-2 minutes with the pt exhibiting jerky/twitchy behavior. En route, his vital signs were 113/72, 100 % on RA, and sinus rhythm. Pt notes that he is cold and shivering with a headache. He is taking his seizure medication, Depakote, as directed. His last seizure was 2-3 days ago. He denies a recent change in medications. He does not have any known allergies. Pt specifically denies fecal or urinary incontinence.     Social Hx: + (0.25 ppd) Tobacco, + (occasional) EtOH, - (no history of cocaine use) Illicit Drugs    There are no other complaints, changes or physical findings at this time.    Primary Care Provider: Zannie CoveAlysha Z. Doreatha MassedNakoneczny, NP   Specialist: Neurology - Dr. Wyvonnia DuskyArulu     Past History     Past Medical History:   Past Medical History:   Diagnosis Date   ??? Asthma    ??? Chest pain    ??? Cough    ??? Kidney stone    ??? Migraine    ??? Muscle pain    ??? Seizures (HCC)    ??? Skipped beats    ??? SOB (shortness of breath)       Past Surgical History:   History reviewed. No pertinent surgical history.     Family History:   Family History   Problem Relation Age of Onset   ??? Cancer Mother      stomach   ??? Migraines Mother    ??? Seizures Mother       Social History:   Social History   Substance Use Topics   ??? Smoking status: Current Some Day Smoker     Packs/day: 0.25   ??? Smokeless tobacco: Never Used       Comment: approx 2-3   ??? Alcohol use Yes      Comment: Occassionally      Allergies:   No Known Allergies     Review of Systems   Review of Systems   Constitutional: Negative.  Negative for fever.   HENT: Negative.  Negative for drooling, facial swelling and trouble swallowing.    Eyes: Negative.  Negative for discharge and redness.   Respiratory: Negative.  Negative for chest tightness, shortness of breath and wheezing.    Cardiovascular: Negative.  Negative for chest pain.   Gastrointestinal: Negative.  Negative for abdominal distention, abdominal pain, constipation, diarrhea, nausea and vomiting.        - fecal incontinence    Endocrine: Negative.    Genitourinary: Negative.  Negative for difficulty urinating and dysuria.        - urinary incontinence    Musculoskeletal: Negative.  Negative for arthralgias and myalgias.   Skin: Negative.  Negative for color change and rash.   Allergic/Immunologic: Negative.  Neurological: Positive for seizures and headaches. Negative for syncope, facial asymmetry and speech difficulty.   Hematological: Negative.    Psychiatric/Behavioral: Negative.  Negative for agitation and confusion.   All other systems reviewed and are negative.    Physical Exam  Physical Exam   Constitutional: He is oriented to person, place, and time. He appears well-developed and well-nourished.   HENT:   Head: Normocephalic and atraumatic.   No signs of facial trauma    Eyes: Conjunctivae and EOM are normal.   Neck: Neck supple.   Cardiovascular: Normal rate, regular rhythm and intact distal pulses.    Pulmonary/Chest: No accessory muscle usage. No respiratory distress.   Abdominal: Soft. Normal appearance. There is no tenderness.   Musculoskeletal: Normal range of motion.   Neurological: He is alert and oriented to person, place, and time.   Moving all four extremities   Non-focal neuro exam   Skin: Skin is warm and dry.   Psychiatric: He has a normal mood and affect. His behavior is normal.  Thought content normal.   Nursing note and vitals reviewed.    Medical Decision Making   I am the first provider for this patient.     I reviewed the vital signs, available nursing notes, past medical history, past surgical history, family history and social history.     Provider Notes: DDx: recurrent seizures, medication noncompliance, dehydration, electrolyte abnormality      ED Course:  12:50 AM   Initial assessment performed. The patients presenting problems have been discussed, and they are in agreement with the care plan formulated and outlined with them.  I have encouraged them to ask questions as they arise throughout their visit.    Progress Notes:  1:00 AM  Per chart review, pt saw Dr. Wyvonnia DuskyArulu, Neurologist, on 11/30/2015. There was an increase in his medication sometime over the summer of 2017 from Depakote 500 mg 2x/day to Depakote 500 mg 3x/day, but pt has been evaluated by Dr. Wyvonnia DuskyArulu since the change.     Diagnostic Study Results   Labs -      Recent Results (from the past 12 hour(s))   VALPROIC ACID    Collection Time: 01/04/16  1:04 AM   Result Value Ref Range    Valproic acid 99 50 - 100 ug/ml   MAGNESIUM    Collection Time: 01/04/16  1:04 AM   Result Value Ref Range    Magnesium 2.0 1.6 - 2.4 mg/dL   METABOLIC PANEL, BASIC    Collection Time: 01/04/16  1:04 AM   Result Value Ref Range    Sodium 138 136 - 145 mmol/L    Potassium 4.4 3.5 - 5.1 mmol/L    Chloride 104 97 - 108 mmol/L    CO2 28 21 - 32 mmol/L    Anion gap 6 5 - 15 mmol/L    Glucose 84 65 - 100 mg/dL    BUN 19 6 - 20 MG/DL    Creatinine 1.610.95 0.960.70 - 1.30 MG/DL    BUN/Creatinine ratio 20 12 - 20      GFR est AA >60 >60 ml/min/1.8873m2    GFR est non-AA >60 >60 ml/min/1.10673m2    Calcium 8.8 8.5 - 10.1 MG/DL   DRUG SCREEN, URINE    Collection Time: 01/04/16  1:04 AM   Result Value Ref Range    AMPHETAMINES NEGATIVE  NEG      BARBITURATES NEGATIVE  NEG      BENZODIAZEPINES NEGATIVE  NEG  COCAINE NEGATIVE  NEG      METHADONE NEGATIVE  NEG       OPIATES NEGATIVE  NEG      PCP(PHENCYCLIDINE) NEGATIVE  NEG      THC (TH-CANNABINOL) NEGATIVE  NEG      Drug screen comment (NOTE)        Radiologic Studies -  The following have been ordered and reviewed:    CXR Results  (Last 48 hours)               01/02/16 2043  XR CHEST PA LAT Final result    Impression:  IMPRESSION: No acute cardiopulmonary disease.           Narrative:  Indication: Shortness of breath, headache all day. Chest pain, worse with   movement.       Exam: PA and lateral views of the chest.       Direct comparison is made to prior CXR dated August 04, 2015.       Findings: Cardiomediastinal silhouette is within normal limits. Lungs are clear   bilaterally. Pleural spaces are normal. Osseous structures are intact.               Vital Signs-Reviewed the patient's vital signs.   Patient Vitals for the past 12 hrs:   Temp Pulse Resp BP SpO2   01/04/16 0200 - - - 132/78 -   01/04/16 0051 98.7 ??F (37.1 ??C) 97 18 102/75 98 %     Medications Given in the ED:  Medications   sodium chloride 0.9 % bolus infusion 1,000 mL (0 mL IntraVENous IV Completed 01/04/16 0213)   LORazepam (ATIVAN) injection 1 mg (1 mg IntraVENous Given 01/04/16 0228)     Diagnosis:  Clinical Impression:   1. Recurrent seizures (HCC)         Plan:  1:   Follow-up Information     Follow up With Details Comments Contact Info    Forrestine Him, MD Today  [address]  [address] [place] [address]  [phone]      [facility] EMERGENCY DEPT  As needed, If symptoms worsen [address] [place] [address]  [phone]        2:   Discharge Medication List as of 01/04/2016  2:32 AM        Return to ED if worse.     Disposition:  Discharge Note:  2:22 AM  The patient has been re-evaluated and is ready for discharge. Reviewed available results with patient. Counseled patient on diagnosis and care plan. Patient has expressed understanding, and all questions have been answered. Patient agrees with plan and agrees to follow up as recommended,  or return to the ED if their symptoms worsen. Discharge instructions have been provided and explained to the patient, along with reasons to return to the ED.  _______________________________   Attestations:     Attestation:  This note is prepared by Kara Mead, acting as Scribe for Mertha Finders, MD    Mertha Finders, MD: The scribe's documentation has been prepared under my direction and personally reviewed by me in its entirety. I confirm that the note above accurately reflects all work, treatment, procedures, and medical decision making performed by me.  _______________________________

## 2016-01-28 ENCOUNTER — Inpatient Hospital Stay
Admit: 2016-01-28 | Discharge: 2016-01-28 | Disposition: A | Discharge status: Home / Self Care | Payer: PRIVATE HEALTH INSURANCE | Attending: Emergency Medicine

## 2016-01-28 DIAGNOSIS — G40909 Epilepsy, unspecified, not intractable, without status epilepticus: Secondary | ICD-10-CM

## 2016-01-28 LAB — METABOLIC PANEL, BASIC
Anion gap: 6 mmol/L (ref 5–15)
BUN/Creatinine ratio: 10 — ABNORMAL LOW (ref 12–20)
BUN: 11 MG/DL (ref 6–20)
CO2: 30 mmol/L (ref 21–32)
Calcium: 8.5 MG/DL (ref 8.5–10.1)
Chloride: 105 mmol/L (ref 97–108)
Creatinine: 1.13 MG/DL (ref 0.70–1.30)
GFR est AA: >60 mL/min/{1.73_m2} (ref >60)
GFR est non-AA: >60 mL/min/{1.73_m2} (ref >60)
Glucose: 98 mg/dL (ref 65–100)
Potassium: 4.8 mmol/L (ref 3.5–5.1)
Sodium: 141 mmol/L (ref 136–145)

## 2016-01-28 LAB — CBC WITH AUTOMATED DIFF
ABS. BASOPHILS: 0 10*3/uL (ref 0.0–0.1)
ABS. EOSINOPHILS: 0 10*3/uL (ref 0.0–0.4)
ABS. LYMPHOCYTES: 1.8 10*3/uL (ref 0.8–3.5)
ABS. MONOCYTES: 0.3 10*3/uL (ref 0.0–1.0)
ABS. NEUTROPHILS: 5.5 10*3/uL (ref 1.8–8.0)
BASOPHILS: 0 % (ref 0–1)
EOSINOPHILS: 0 % (ref 0–7)
HCT: 33.3 % — ABNORMAL LOW (ref 36.6–50.3)
HGB: 10.5 g/dL — ABNORMAL LOW (ref 12.1–17.0)
LYMPHOCYTES: 24 % (ref 12–49)
MCH: 24.4 PG — ABNORMAL LOW (ref 26.0–34.0)
MCHC: 31.5 g/dL (ref 30.0–36.5)
MCV: 77.4 FL — ABNORMAL LOW (ref 80.0–99.0)
MONOCYTES: 4 % — ABNORMAL LOW (ref 5–13)
NEUTROPHILS: 72 % (ref 32–75)
PLATELET: 212 10*3/uL (ref 150–400)
RBC: 4.3 M/uL (ref 4.10–5.70)
RDW: 14.6 % — ABNORMAL HIGH (ref 11.5–14.5)
WBC: 7.7 10*3/uL (ref 4.1–11.1)

## 2016-01-28 LAB — VALPROIC ACID: Valproic acid: 53 ug/ml (ref 50–100)

## 2016-01-28 LAB — MAGNESIUM: Magnesium: 2.3 mg/dL (ref 1.6–2.4)

## 2016-01-28 MED FILL — MIDAZOLAM 5 MG/ML IJ SOLN: 5 mg/mL | INTRAMUSCULAR | Qty: 1

## 2016-01-28 NOTE — ED Notes (Signed)
Pt resting in bed and appears to be in no distress, will continue to monitor pt.

## 2016-01-28 NOTE — ED Notes (Addendum)
Pt arrived in ED by way of EMS w/ complaint of witnessed seizure that occurred PTA. Pt states his room mate witnessed his seizure and EMS states they witnessed the pt having a seizure en route to the ED. Pt states he takes Depakote for his seizure but did not take his night dose b/c he was out drinking alcohol. EMS states they gave the pt 2.5 mg of Versed en route. Pt is A&O but appears drowsy.    Emergency Department Nursing Plan of Care       The Nursing Plan of Care is developed from the Nursing assessment and Emergency Department Attending provider initial evaluation.  The plan of care may be reviewed in the ???ED Provider note???.    The Plan of Care was developed with the following considerations:   Patient / Family readiness to learn indicated VH:[id number]:verbalized understanding  Persons(s) to be included in education: patient  Barriers to Learning/Limitations:No    Signed     Abagail Kitchensoel T Townes, RN    01/28/2016   4:50 AM

## 2016-01-28 NOTE — ED Notes (Signed)
Patient is ambulatory to restroom with unsteady gait.

## 2016-01-28 NOTE — ED Notes (Signed)
Patient reports he walks with a limp at baseline and that he is okay to walk three blocks home.  Discharge paperwork reviewed with patient and he was advised to keep his appointment in two days with [facility]. Patient left ambulatory with baseline limp, in no acute distress. Patient could not find his glasses on leaving, he did have his wallet.

## 2016-01-28 NOTE — ED Notes (Signed)
Provider at bedside.

## 2016-01-28 NOTE — ED Notes (Signed)
Pt sleeping in bed and appears to be in no distress, will continue to monitor pt.

## 2016-01-28 NOTE — ED Notes (Signed)
Patient is alert, wakes up easily, but extremely drowsy. Patient lives three blocks away, but is unsteady on his feet. Will ambulate patient in another hour to assess his ability to walk home in 35 degree weather. Charge nurse notified.

## 2016-01-28 NOTE — ED Notes (Signed)
Bedside and Verbal shift change report given to Henriette CombsS Johnson, RN (oncoming nurse) by Abagail KitchensNoel T Townes, RN (offgoing nurse). Report included the following information SBAR, ED Summary and Recent Results.

## 2016-01-28 NOTE — ED Notes (Signed)
Pt standing unsteadily in room, assisted pt to stand while urinating.

## 2016-01-28 NOTE — ED Provider Notes (Signed)
EMERGENCY DEPARTMENT HISTORY AND PHYSICAL EXAM      Date: 01/28/2016  Patient Name: Connor Prince    History of Presenting Illness     Chief Complaint   Patient presents with   ??? Seizure     EMS reports witnessing 2 tonic-clonic seizures, administered 2.5 mg Versed enroute. Pt reports he did not take his seizure meds last night, reports drinking 3-4 cups of liquor last night.        History Provided By: Patient    HPI: Connor Prince, 30 y.o. male with PMHx significant for asthma, and seizures presents via EMS to the ED with cc of recurring seizures x ~1-2 hours. Pt reports being on 500mg  Depakote which he is supposed to take once in the morning, and twice at night. He further notes binge drinking last night (liquor), and not taking his night dosage of Depakote; He only took the morning dosage yesterday morning at ~11:00AM. Per EMS, pt went through 2 tonic-clonic seizures; EMS administered 2.5mg  of Versed to treat him while en route to [facility] ED. He denies being on medications for asthma, HTN, and DM.     Social Hx:   ETOH: +  Tobacco: +, 2-3 cigarettes/day  Illicit drug use: +, marijuana    PCP: Alysha Z. Nakoneczny, NP    There are no other complaints, changes, or physical findings at this time.    Current Outpatient Prescriptions   Medication Sig Dispense Refill   ??? divalproex DR (DEPAKOTE) 500 mg tablet Take 2 tablets at night, 1 tablet in morning. 90 Tab 0       Past History     Past Medical History:  Past Medical History:   Diagnosis Date   ??? Asthma    ??? Chest pain    ??? Cough    ??? EP (epilepsy) (HCC)    ??? Kidney stone    ??? Migraine    ??? Muscle pain    ??? Seizures (HCC)    ??? Skipped beats    ??? SOB (shortness of breath)        Past Surgical History:  History reviewed. No pertinent surgical history.    Family History:  Family History   Problem Relation Age of Onset   ??? Cancer Mother      stomach   ??? Migraines Mother    ??? Seizures Mother        Social History:  Social History   Substance Use Topics    ??? Smoking status: Current Some Day Smoker     Packs/day: 0.25   ??? Smokeless tobacco: Never Used      Comment: approx 2-3   ??? Alcohol use Yes      Comment: Occassionally       Allergies:  No Known Allergies      Review of Systems   Review of Systems   Constitutional: Negative for appetite change, chills, diaphoresis, fatigue and fever.   HENT: Negative for sore throat and trouble swallowing.    Respiratory: Negative for cough and shortness of breath.    Cardiovascular: Negative for chest pain.   Gastrointestinal: Negative for abdominal pain, diarrhea, nausea and vomiting.   Genitourinary: Negative for difficulty urinating, dysuria and frequency.   Musculoskeletal: Negative for arthralgias, back pain and myalgias.   Skin: Negative for color change and rash.   Neurological: Positive for seizures. Negative for weakness and numbness.   Hematological: Does not bruise/bleed easily.   All other systems reviewed and are negative.  Physical Exam   Physical Exam   Constitutional: He is oriented to person, place, and time. He appears well-developed and well-nourished. No distress.   HENT:   Head: Normocephalic and atraumatic.   Mouth/Throat: Oropharynx is clear and moist.   Eyes: Pupils are equal, round, and reactive to light.   Neck: Neck supple.   Cardiovascular: Normal rate, regular rhythm, normal heart sounds and intact distal pulses.  Exam reveals no gallop and no friction rub.    No murmur heard.  Pulmonary/Chest: Effort normal and breath sounds normal. No respiratory distress. He has no wheezes. He has no rhonchi. He has no rales.   Abdominal: Soft. There is no tenderness. There is no rebound, no guarding and no CVA tenderness.   Musculoskeletal: Normal range of motion. He exhibits no edema or tenderness.   Lymphadenopathy:     He has no cervical adenopathy.   Neurological: He is alert and oriented to person, place, and time.   Skin: Skin is warm. No rash noted. He is not diaphoretic.    Nursing note and vitals reviewed.        Diagnostic Study Results     Labs -     Recent Results (from the past 12 hour(s))   CBC WITH AUTOMATED DIFF    Collection Time: 01/28/16  4:27 AM   Result Value Ref Range    WBC 7.7 4.1 - 11.1 K/uL    RBC 4.30 4.10 - 5.70 M/uL    HGB 10.5 (L) 12.1 - 17.0 g/dL    HCT 16.133.3 (L) 09.636.6 - 50.3 %    MCV 77.4 (L) 80.0 - 99.0 FL    MCH 24.4 (L) 26.0 - 34.0 PG    MCHC 31.5 30.0 - 36.5 g/dL    RDW 04.514.6 (H) 40.911.5 - 14.5 %    PLATELET 212 150 - 400 K/uL    NEUTROPHILS 72 32 - 75 %    LYMPHOCYTES 24 12 - 49 %    MONOCYTES 4 (L) 5 - 13 %    EOSINOPHILS 0 0 - 7 %    BASOPHILS 0 0 - 1 %    ABS. NEUTROPHILS 5.5 1.8 - 8.0 K/UL    ABS. LYMPHOCYTES 1.8 0.8 - 3.5 K/UL    ABS. MONOCYTES 0.3 0.0 - 1.0 K/UL    ABS. EOSINOPHILS 0.0 0.0 - 0.4 K/UL    ABS. BASOPHILS 0.0 0.0 - 0.1 K/UL   METABOLIC PANEL, BASIC    Collection Time: 01/28/16  4:27 AM   Result Value Ref Range    Sodium 141 136 - 145 mmol/L    Potassium 4.8 3.5 - 5.1 mmol/L    Chloride 105 97 - 108 mmol/L    CO2 30 21 - 32 mmol/L    Anion gap 6 5 - 15 mmol/L    Glucose 98 65 - 100 mg/dL    BUN 11 6 - 20 MG/DL    Creatinine 8.111.13 9.140.70 - 1.30 MG/DL    BUN/Creatinine ratio 10 (L) 12 - 20      GFR est AA >60 >60 ml/min/1.6073m2    GFR est non-AA >60 >60 ml/min/1.4573m2    Calcium 8.5 8.5 - 10.1 MG/DL   MAGNESIUM    Collection Time: 01/28/16  4:27 AM   Result Value Ref Range    Magnesium 2.3 1.6 - 2.4 mg/dL   VALPROIC ACID    Collection Time: 01/28/16  4:27 AM   Result Value Ref Range    Valproic acid 53  50 - 100 ug/ml       Medical Decision Making   I am the first provider for this patient.    I reviewed the vital signs, available nursing notes, past medical history, past surgical history, family history and social history.    Vital Signs-Reviewed the patient's vital signs.  Patient Vitals for the past 12 hrs:   Temp Pulse Resp BP SpO2   01/28/16 0600 - 82 14 114/66 99 %   01/28/16 0500 - 78 14 111/59 99 %    01/28/16 0411 97.7 ??F (36.5 ??C) 83 18 115/59 100 %       Provider Notes (Medical Decision Making):     DDx: recurrent seizure, alcohol withdrawal seizure, medication non-compliance.    ED Course:   Initial assessment performed. The patients presenting problems have been discussed, and they are in agreement with the care plan formulated and outlined with them.  I have encouraged them to ask questions as they arise throughout their visit.    Disposition:  DISCHARGE NOTE  5:36 AM  The patient has been re-evaluated and is ready for discharge. Reviewed available results with patient. Counseled pt on diagnosis and care plan. Pt has expressed understanding, and all questions have been answered. Pt agrees with plan and agrees to F/U as recommended, or return to the ED if their sxs worsen. Discharge instructions have been provided and explained to the pt, along with reasons to return to the ED.    PLAN:  1.   Current Discharge Medication List        2.   Follow-up Information     Follow up With Details Comments Contact Info    Alysha Z. Doreatha MassedNakoneczny, NP   [address]  [address]  [place] [id number]  [phone]          Return to ED if worse     Diagnosis     Clinical Impression:   1. At risk for medication noncompliance    2. Alcohol abuse        Attestations:   This note is prepared by Lovie MacadamiaJohn P Hill, acting as Scribe for Blenda MountsJames W Forest-Lam, MD.      The scribe's documentation has been prepared under my direction and personally reviewed by me in its entirety. I confirm that the note above accurately reflects all work, treatment, procedures, and medical decision making performed by me.  Blenda MountsJames W Forest-Lam, MD

## 2016-01-30 ENCOUNTER — Ambulatory Visit: Admit: 2016-01-30 | Discharge: 2016-01-30 | Payer: PRIVATE HEALTH INSURANCE | Attending: Family | Primary: Family

## 2016-01-30 DIAGNOSIS — Z23 Encounter for immunization: Secondary | ICD-10-CM

## 2016-01-30 MED ORDER — DIVALPROEX 500 MG TAB, DELAYED RELEASE
500 mg | ORAL_TABLET | ORAL | 0 refills | Status: DC
Start: 2016-01-30 — End: 2016-08-10

## 2016-01-30 NOTE — Progress Notes (Signed)
1. Have you been to the ER, urgent care clinic since your last visit?  Hospitalized since your last visit?Yes [facility]: [facility]    2. Have you seen or consulted any other health care providers outside of the [facility] since your last visit?  Include any pap smears or colon screening. No     Patient denies pain at this time.    After obtaining consent, and per orders of Doristine LocksAlysha Nakonenczny NP, injection of Influenza given by Gerhard Muncharshell Waller, LPN. Patient instructed to remain in clinic for 15 minutes afterwards, and to report any adverse reaction to me immediately.

## 2016-01-30 NOTE — Progress Notes (Signed)
Subjective: (As above and below)     Chief Complaint   Patient presents with   ??? ED Follow-up     seizure     Fuller PlanChristopher Prince is a 30 y.o. year old male who presents for ED f/u on 01/28/16 for seizure. He had 3 ED visits in November for med refill, ha and another seizure. He has had poor f/u with neuro. He states he is taking his medications, last depakote level normal. He denies seizures since 01/28/16 - at that time, he had drank "3-4 cups of liquor". He denies that he regularly drinks that much.     He lives in an apartment by himself (his roommate recently left) - he does not know that he will be able to afford to stay there - he states that he does have friends he can stay with if need be. [place] where he was living with his grandmother (who has passed). He has a brother and grandfather in [place], however, he is not very close with them.     He spends his days going to [employer], visiting food banks, local churches.    Reviewed PmHx, RxHx, FmHx, SocHx, AllgHx and updated in chart.  Family History   Problem Relation Age of Onset   ??? Cancer Mother      stomach   ??? Migraines Mother    ??? Seizures Mother        Past Medical History:   Diagnosis Date   ??? Asthma    ??? Chest pain    ??? Cough    ??? EP (epilepsy) (HCC)    ??? Kidney stone    ??? Migraine    ??? Muscle pain    ??? Seizures (HCC)    ??? Skipped beats    ??? SOB (shortness of breath)       Social History     Social History   ??? Marital status: SINGLE     Spouse name: N/A   ??? Number of children: N/A   ??? Years of education: N/A     Social History Main Topics   ??? Smoking status: Current Some Day Smoker     Packs/day: 0.25   ??? Smokeless tobacco: Never Used      Comment: approx 2-3   ??? Alcohol use Yes      Comment: Occassionally   ??? Drug use: Yes     Special: Marijuana   ??? Sexual activity: Not Asked     Other Topics Concern   ??? None     Social History Narrative    On disability          Current Outpatient Prescriptions    Medication Sig   ??? divalproex DR (DEPAKOTE) 500 mg tablet Take 2 tablets at night, 1 tablet in morning.     No current facility-administered medications for this visit.        Review of Systems:   Constitutional:    Negative for fever and chills, negative diaphoresis.   HEENT:              Negative for neck pain and stiffness.  Eyes:                  Negative for visual disturbance, itching, redness or discharge.   Respiratory:        Negative for cough and shortness of breath.   Cardiovascular:  Negative for chest pain and palpitations.  Gastrointestinal: Negative for nausea, vomiting, abdominal pain, diarrhea or constipation.  Genitourinary:     Negative for dysuria and frequency.   Musculoskeletal: Negative for falls, tenderness and swelling.  Skin:                    Negative for rash, masses or lesions.   Neurological:       Negative for dizzyness, seizure, loss of consciousness, weakness and numbness.     Objective:     Vitals:    01/30/16 0855   BP: 130/69   Pulse: 80   Resp: 16   Temp: 98.1 ??F (36.7 ??C)   TempSrc: Oral   SpO2: 100%   Weight: 209 lb 3.2 oz (94.9 kg)   Height: 6\' 6"  (1.981 m)       Physical Examination: General appearance - alert, well appearing, and in no distress  Mental status - alert, oriented to person, place, and time  Chest - clear to auscultation, no wheezes, rales or rhonchi, symmetric air entry  Heart - normal rate, regular rhythm, normal S1, S2, no murmurs, rubs, clicks or gallops      Assessment/ Plan:   Follow-up Disposition:  Return in about 6 months (around 07/30/2016), or if symptoms worsen or fail to improve.     Pt provided with list of resources/shelters if need be.     1. Encounter for immunization    - INFLUENZA VIRUS VACCINE QUADRIVALENT, PRESERVATIVE FREE SYRINGE ([id number])    2. Seizures (HCC)  F/u with neuro as scheduled  - divalproex DR (DEPAKOTE) 500 mg tablet; Take 2 tablets at night, 1 tablet in morning.  Dispense: 90 Tab; Refill: 0         I have discussed the diagnosis with the patient and the intended plan as seen in the above orders.  The patient has received an after-visit summary and questions were answered concerning future plans.  Pt conveyed understanding of plan.      Medication Side Effects and Warnings were discussed with patient: yes  Patient Labs were reviewed: yes  Patient Past Records were reviewed:  yes    Daksha Koone Z. Doreatha MassedNakoneczny, NP

## 2016-02-22 ENCOUNTER — Inpatient Hospital Stay
Admit: 2016-02-22 | Discharge: 2016-02-23 | Disposition: A | Discharge status: Home / Self Care | Payer: PRIVATE HEALTH INSURANCE | Attending: Emergency Medicine

## 2016-02-22 DIAGNOSIS — G40909 Epilepsy, unspecified, not intractable, without status epilepticus: Secondary | ICD-10-CM

## 2016-02-22 NOTE — ED Notes (Signed)
Ambulatory from waiting room; steady gait. Seizure pads placed on rails. Bed low position, rails up and nurse bell within reach.

## 2016-02-22 NOTE — ED Provider Notes (Signed)
HPI Comments: 30 y.o. male with past medical history significant for Seizures, Epilepsy, Migraines, Asthma, and Kidney Stones who presents via EMS with chief complaint of Evaluation after Seizure. Patient arrives after a witnessed seizure episode lasting for a duration of "several minutes".  Pt denies any injury during episode. Pt reports slight headache after seizure, however denies any current headache.  [facility] with no acute pain or discomfort, stating that "he feels back to normal". 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 Patient reports his dose was increased yesterday at [facility] after a seizure. Patient reports he has had three seizures in the past week which is not normal for him. Pt denies fever, chills, cough, congestion, shortness of breath, chest pain, abdominal pain, nausea, vomiting, diarrhea, difficulty with urination or dysuria.     There are no other acute medical concerns at this time.    PCP: Zannie CoveAlysha Z. Doreatha MassedNakoneczny, NP    Social History: Alcohol Use: Occasional    Note written by Leone BrandBrittney Habel, Scribe, as dictated by Raynald KempJoseph P Mylinda Brook, MD 10:03 PM    The history is provided by the patient.        Past Medical History:   Diagnosis Date   ??? Asthma    ??? Chest pain    ??? Cough    ??? EP (epilepsy) (HCC)    ??? Kidney stone    ??? Migraine    ??? Muscle pain    ??? Seizures (HCC)    ??? Skipped beats    ??? SOB (shortness of breath)        History reviewed. No pertinent surgical history.      Family History:   Problem Relation Age of Onset   ??? Cancer Mother      stomach   ??? Migraines Mother    ??? Seizures Mother        Social History     Social History   ??? Marital status: SINGLE     Spouse name: N/A   ??? Number of children: N/A   ??? Years of education: N/A     Occupational History   ??? Not on file.     Social History Main Topics   ??? Smoking status: Current Some Day Smoker     Packs/day: 0.25   ??? Smokeless tobacco: Never Used      Comment: approx 2-3    ??? Alcohol use Yes      Comment: Occassionally   ??? Drug use: Yes     Special: Marijuana   ??? Sexual activity: Not on file     Other Topics Concern   ??? Not on file     Social History Narrative    On disability         ALLERGIES: Review of patient's allergies indicates no known allergies.    Review of Systems   Constitutional: Negative for chills and fever.   HENT: Negative for congestion.    Respiratory: Negative for cough and shortness of breath.    Cardiovascular: Negative for chest pain.   Gastrointestinal: Negative for abdominal pain, diarrhea, nausea and vomiting.   Genitourinary: Negative for difficulty urinating and dysuria.   Neurological: Positive for seizures.   All other systems reviewed and are negative.      Vitals:    02/22/16 1854 02/22/16 2126   BP: 128/61 123/63   Pulse: 84 79   Resp: 18 16   Temp: 98.1 ??F (36.7 ??  C) 98.4 ??F (36.9 ??C)   SpO2: 100% 99%   Weight: 90.7 kg (200 lb)    Height: 6\' 6"  (1.981 m)             Physical Exam   Constitutional: He appears well-developed and well-nourished.   HENT:   Head: Normocephalic and atraumatic.   Mouth/Throat: Oropharynx is clear and moist.   Eyes: EOM are normal. Pupils are equal, round, and reactive to light.   Neck: Normal range of motion. Neck supple.   Cardiovascular: Normal rate, regular rhythm, normal heart sounds and intact distal pulses.  Exam reveals no gallop and no friction rub.    No murmur heard.  Pulmonary/Chest: Effort normal. No respiratory distress. He has no wheezes. He has no rales.   Abdominal: Soft. There is no tenderness. There is no rebound.   Musculoskeletal: Normal range of motion. He exhibits no tenderness.   Neurological: He is alert. No cranial nerve deficit.   Motor; symmetric   Skin: No erythema.   Psychiatric: He has a normal mood and affect. His behavior is normal.   Nursing note and vitals reviewed.   Note written by Leone BrandBrittney Habel, Scribe, as dictated by Raynald KempJoseph P Gurtej Noyola, MD 10:03 PM    MDM  ED Course       Procedures

## 2016-02-22 NOTE — ED Notes (Signed)
Patient discharged by MD. Results and discharge instructions reviewed with the patient by MD. Patient verbalizes understanding. Patient discharged home, stable, ambulatory, in no acute distress.

## 2016-02-22 NOTE — ED Triage Notes (Addendum)
Pt arrives with witnessed seizure lasting several minutes, takes Depakote & has not missed any doses (took double dose this morning as directed by his doctor). 3rd seizure this week which is abnormal for pt. Arrives A &O. BG was 104 per EMS. Lives at a homeless shelter currently.

## 2016-02-22 NOTE — ED Notes (Addendum)
Seizure pads placed on the bed for patient safety; patient remains stable and in no acute distress; within view of nurses station; VSS

## 2016-02-23 LAB — METABOLIC PANEL, COMPREHENSIVE
A-G Ratio: 1 — ABNORMAL LOW (ref 1.1–2.2)
ALT (SGPT): 19 U/L (ref 12–78)
AST (SGOT): 15 U/L (ref 15–37)
Albumin: 3.8 g/dL (ref 3.5–5.0)
Alk. phosphatase: 57 U/L (ref 45–117)
Anion gap: 5 mmol/L (ref 5–15)
BUN/Creatinine ratio: 15 (ref 12–20)
BUN: 16 MG/DL (ref 6–20)
Bilirubin, total: 0.3 MG/DL (ref 0.2–1.0)
CO2: 32 mmol/L (ref 21–32)
Calcium: 9.1 MG/DL (ref 8.5–10.1)
Chloride: 103 mmol/L (ref 97–108)
Creatinine: 1.1 MG/DL (ref 0.70–1.30)
GFR est AA: >60 mL/min/{1.73_m2} (ref >60)
GFR est non-AA: >60 mL/min/{1.73_m2} (ref >60)
Globulin: 3.7 g/dL (ref 2.0–4.0)
Glucose: 78 mg/dL (ref 65–100)
Potassium: 4.3 mmol/L (ref 3.5–5.1)
Protein, total: 7.5 g/dL (ref 6.4–8.2)
Sodium: 140 mmol/L (ref 136–145)

## 2016-02-23 LAB — DRUG SCREEN, URINE
AMPHETAMINES: NEGATIVE
BARBITURATES: NEGATIVE
BENZODIAZEPINES: NEGATIVE
COCAINE: NEGATIVE
METHADONE: NEGATIVE
OPIATES: NEGATIVE
PCP(PHENCYCLIDINE): NEGATIVE
THC (TH-CANNABINOL): NEGATIVE

## 2016-02-23 LAB — CBC WITH AUTOMATED DIFF
ABS. BASOPHILS: 0 10*3/uL (ref 0.0–0.1)
ABS. EOSINOPHILS: 0 10*3/uL (ref 0.0–0.4)
ABS. LYMPHOCYTES: 2.4 10*3/uL (ref 0.8–3.5)
ABS. MONOCYTES: 0.4 10*3/uL (ref 0.0–1.0)
ABS. NEUTROPHILS: 4.1 10*3/uL (ref 1.8–8.0)
BASOPHILS: 0 % (ref 0–1)
EOSINOPHILS: 0 % (ref 0–7)
HCT: 34.5 % — ABNORMAL LOW (ref 36.6–50.3)
HGB: 10.9 g/dL — ABNORMAL LOW (ref 12.1–17.0)
LYMPHOCYTES: 35 % (ref 12–49)
MCH: 24.6 PG — ABNORMAL LOW (ref 26.0–34.0)
MCHC: 31.6 g/dL (ref 30.0–36.5)
MCV: 77.9 FL — ABNORMAL LOW (ref 80.0–99.0)
MONOCYTES: 6 % (ref 5–13)
NEUTROPHILS: 59 % (ref 32–75)
PLATELET: 214 10*3/uL (ref 150–400)
RBC: 4.43 M/uL (ref 4.10–5.70)
RDW: 13.9 % (ref 11.5–14.5)
WBC: 7 10*3/uL (ref 4.1–11.1)

## 2016-02-23 LAB — VALPROIC ACID: Valproic acid: 84 ug/ml (ref 50–100)

## 2016-02-23 LAB — ETHYL ALCOHOL: ALCOHOL(ETHYL),SERUM: <10 MG/DL (ref <10)

## 2016-02-23 LAB — MAGNESIUM: Magnesium: 2.1 mg/dL (ref 1.6–2.4)

## 2016-03-12 ENCOUNTER — Encounter: Attending: Neurology | Primary: Family

## 2016-04-15 IMAGING — CR DG KNEE COMPLETE 4+V*R*
5 series · 5 of 5 positions shown · non-contrast
Comparison: None.

CLINICAL DATA: Right knee pain for 7 hr.  No known injury.

EXAM:
RIGHT KNEE - COMPLETE 4+ VIEW

[t knee ap right]
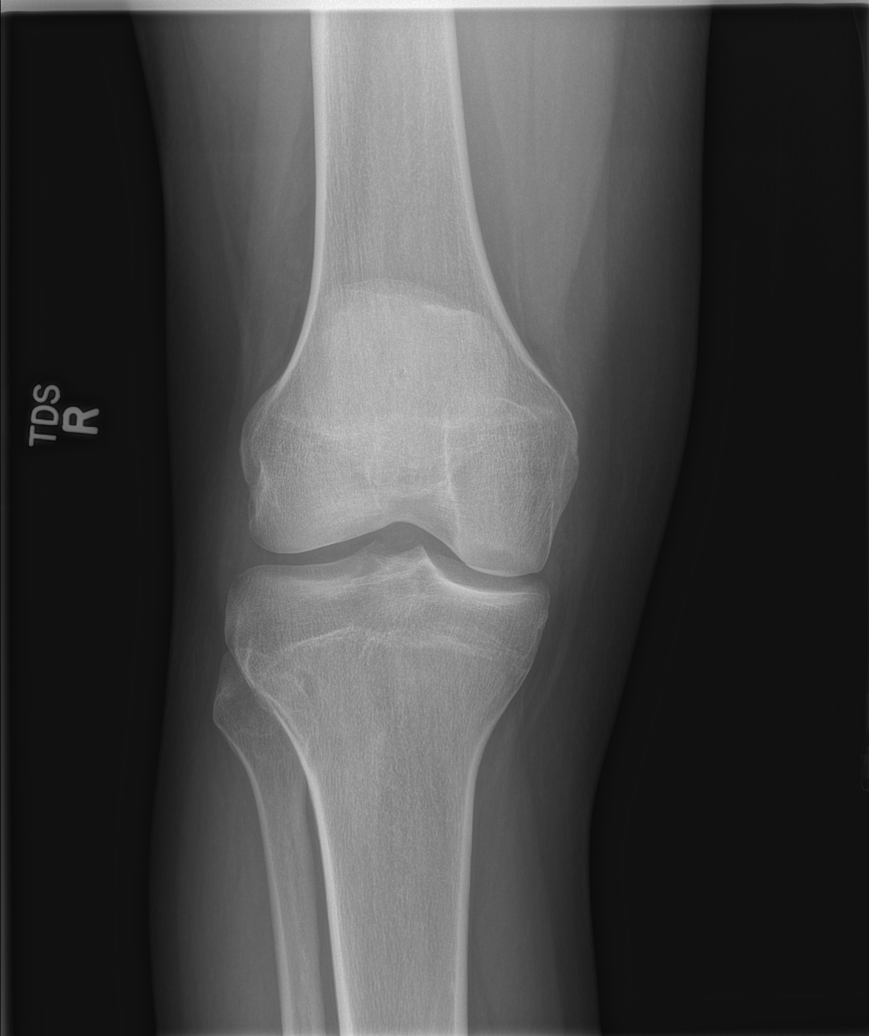

[t knee obl right (1 of 3)]
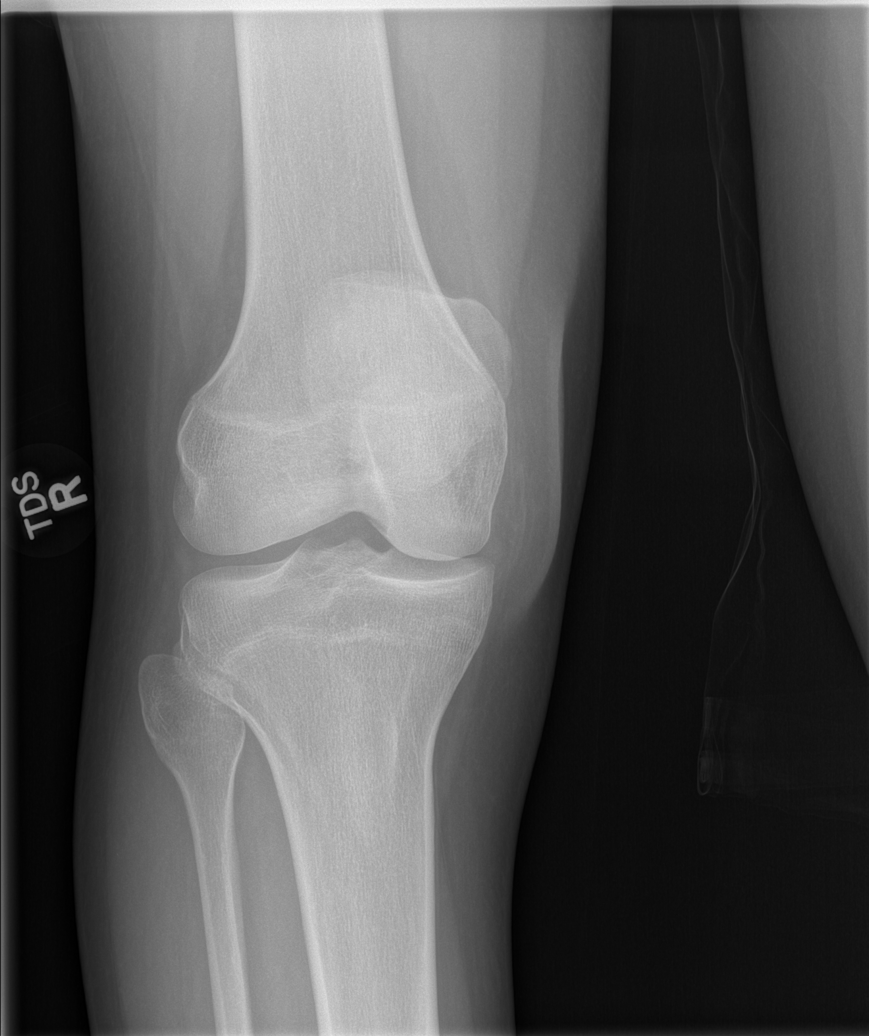

[t knee obl right (2 of 3)]
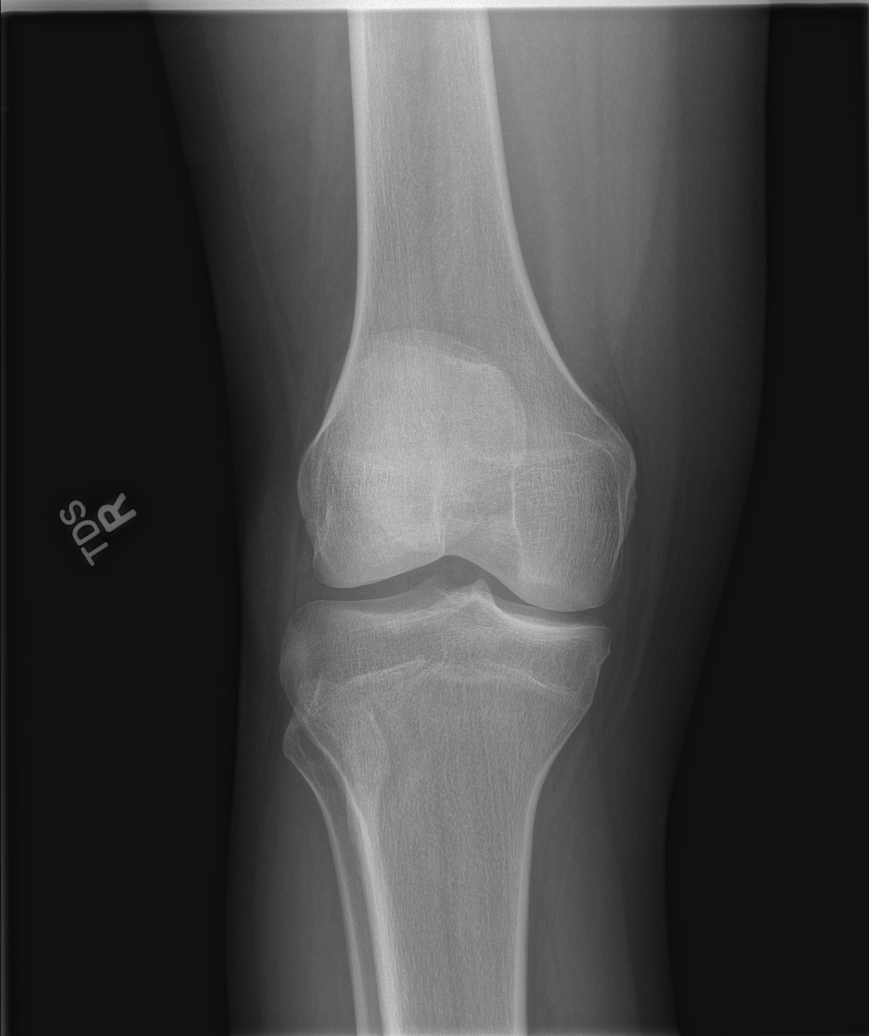

[t knee obl right (3 of 3)]
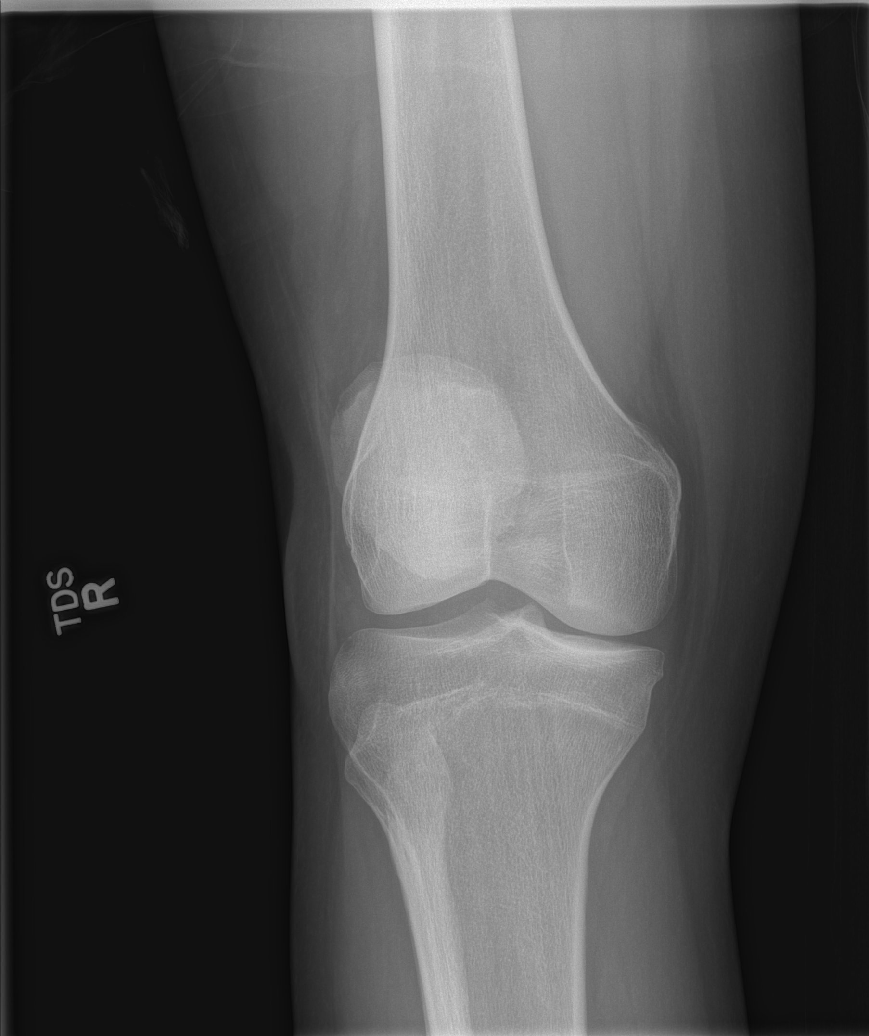

[t knee lat right]
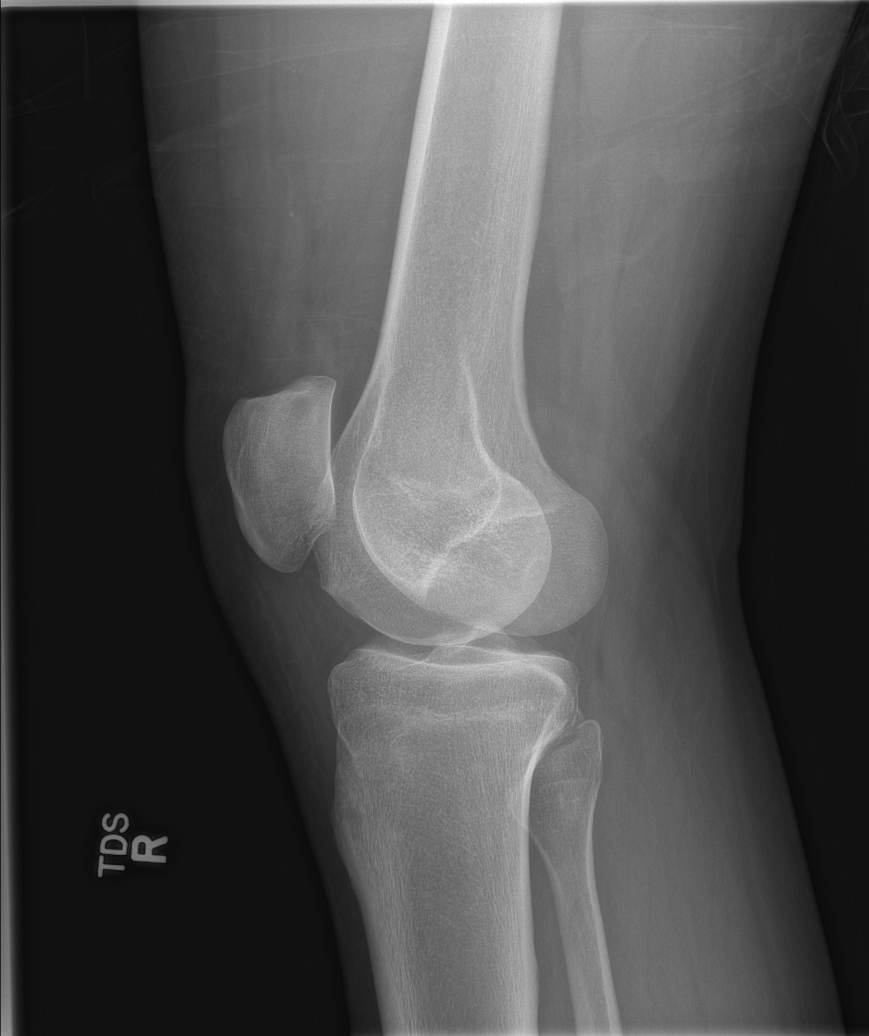

[5 of 5 positions shown; findings below may reference images not displayed]

FINDINGS: There is no evidence of fracture, dislocation, or joint effusion.
There is no evidence of arthropathy or other focal bone abnormality.
Soft tissues are unremarkable.
IMPRESSION: Negative.

## 2016-05-12 ENCOUNTER — Inpatient Hospital Stay
Admit: 2016-05-12 | Discharge: 2016-05-12 | Disposition: A | Discharge status: Home / Self Care | Payer: MEDICAID | Attending: Emergency Medicine

## 2016-05-12 DIAGNOSIS — M25561 Pain in right knee: Secondary | ICD-10-CM

## 2016-05-12 MED ORDER — ACETAMINOPHEN 500 MG TAB
500 mg | ORAL | Status: AC
Start: 2016-05-12 — End: 2016-05-12
  Administered 2016-05-12: 07:00:00 via ORAL

## 2016-05-12 MED ORDER — IBUPROFEN 600 MG TAB
600 mg | ORAL | Status: AC
Start: 2016-05-12 — End: 2016-05-12
  Administered 2016-05-12: 07:00:00 via ORAL

## 2016-05-12 MED FILL — IBUPROFEN 600 MG TAB: 600 mg | ORAL | Qty: 1

## 2016-05-12 MED FILL — MAPAP EXTRA STRENGTH 500 MG TABLET: 500 mg | ORAL | Qty: 2

## 2016-05-12 NOTE — ED Notes (Signed)
Pt presents to ED accompanied by EMS complaining of knees giving out while walking. Pt is alert and oriented x 4, RR even and unlabored, skin is warm and dry. Assesment completed and pt updated on plan of care.       Emergency Department Nursing Plan of Care       The Nursing Plan of Care is developed from the Nursing assessment and Emergency Department Attending provider initial evaluation.  The plan of care may be reviewed in the ???ED Provider note???.    The Plan of Care was developed with the following considerations:   Patient / Family readiness to learn indicated JW:[id number]:verbalized understanding  Persons(s) to be included in education: patient  Barriers to Learning/Limitations:No    Signed     Rolm Baptiserin Knudtson, RN    05/12/2016   2:41 AM

## 2016-05-12 NOTE — ED Notes (Signed)
Discharge instructions were given to the patient by Erin Knudtson, RN.     The patient left the Emergency Department ambulatory, alert and oriented and in no acute distress with 0 prescriptions. The patient was encouraged to call or return to the ED for worsening issues or problems and was encouraged to schedule a follow up appointment for continuing care.     The patient verbalized understanding of discharge instructions and prescriptions, all questions were answered. The patient has no further concerns at this time.

## 2016-05-12 NOTE — ED Provider Notes (Signed)
EMERGENCY DEPARTMENT HISTORY AND PHYSICAL EXAM      Date: 05/12/2016  Patient Name: Connor Prince    History of Presenting Illness     Chief Complaint   Patient presents with   ??? Extremity Weakness     pt reprots weak knees       History Provided By: Patient    HPI: [name], 31 y.o. male with PMHx significant for asthma, borderline DM, kidney stone, seizures, epilepsy, presents via EMS to the ED with cc of sudden onset bilateral knee weakness which caused him to drop to the ground 30 minutes PTA. Pt states "my knees gave out". He reports walking all day since he had to walk from "[place]". Pt noes he was 3 blocks away from his house when his "knees gave out" and called EMS. Pt denies hx of sickle cell anemia. Pt denies being on regular medication or having any new medications. Pt denies allergies to medication. Pt specifically denies fever, chills, N/V/D, abdominal pain, syncope, seizures, numbness or tingling.      PCP: Zannie Cove. Nakoneczny, NP    There are no other complaints, changes, or physical findings at this time.    Current Outpatient Prescriptions   Medication Sig Dispense Refill   ??? divalproex DR (DEPAKOTE) 500 mg tablet Take 2 tablets at night, 1 tablet in morning. 90 Tab 0       Past History     Past Medical History:  Past Medical History:   Diagnosis Date   ??? Asthma    ??? Chest pain    ??? Cough    ??? EP (epilepsy) (HCC)    ??? Kidney stone    ??? Migraine    ??? Muscle pain    ??? Seizures (HCC)    ??? Skipped beats    ??? SOB (shortness of breath)        Past Surgical History:  No past surgical history on file.    Family History:  Family History   Problem Relation Age of Onset   ??? Cancer Mother      stomach   ??? Migraines Mother    ??? Seizures Mother        Social History:  Social History   Substance Use Topics   ??? Smoking status: Current Some Day Smoker     Packs/day: 0.25   ??? Smokeless tobacco: Never Used      Comment: approx 2-3   ??? Alcohol use Yes      Comment: Occassionally        Allergies:  No Known Allergies      Review of Systems   Review of Systems   Constitutional: Negative for chills and fever.   HENT: Negative for congestion and sore throat.    Eyes: Negative for visual disturbance.   Respiratory: Negative for cough and shortness of breath.    Cardiovascular: Negative for chest pain and leg swelling.   Gastrointestinal: Negative for abdominal pain, blood in stool, diarrhea, nausea and vomiting.   Endocrine: Negative for polyuria.   Genitourinary: Negative for dysuria and testicular pain.   Musculoskeletal: Negative for arthralgias, joint swelling and myalgias.   Skin: Negative for rash.   Allergic/Immunologic: Negative for immunocompromised state.   Neurological: Positive for weakness (bilateral knees). Negative for seizures, syncope and numbness (or tingling).   Hematological: Does not bruise/bleed easily.   Psychiatric/Behavioral: Negative for confusion.       Physical Exam   Physical Exam   Constitutional: He  is oriented to person, place, and time. He appears well-developed and well-nourished.   HENT:   Head: Normocephalic and atraumatic.   Mouth/Throat: Oropharynx is clear and moist.   Eyes: Conjunctivae and EOM are normal. Pupils are equal, round, and reactive to light.   Neck: Normal range of motion. Neck supple. No tracheal deviation present.   Cardiovascular: Normal rate, regular rhythm, normal heart sounds and intact distal pulses.    No murmur heard.  Pulmonary/Chest: Effort normal and breath sounds normal. No respiratory distress. He has no wheezes. He has no rales.   Abdominal: Soft. Bowel sounds are normal. There is no tenderness. There is no rebound and no guarding.   Musculoskeletal: Normal range of motion. He exhibits no edema or deformity.        Right knee: He exhibits no effusion.        Left knee: He exhibits no effusion.   Bilateral knees: No ligamentous instability  Antalgic gait noted   Neurological: He is alert and oriented to person, place, and time. GCS eye  subscore is 4. GCS verbal subscore is 5. GCS motor subscore is 6.   EOMI intact, no facial droop or asymmetry, normal/equal sensation in face. Uvula elevates at midline, no tongue deviation. Normal strength with head rotation and shoulder shrug.  5/5 Strength in the bilateral upper and lower extremities, no pronotor drift, normal finger to nose. No truncal ataxia. Normal speech: no dysarthria or aphasia.   Skin: Skin is warm and dry.   Psychiatric: He has a normal mood and affect. His speech is normal.   Nursing note and vitals reviewed.        Medical Decision Making   I am the first provider for this patient.    I reviewed the vital signs, available nursing notes, past medical history, past surgical history, family history and social history.    Vital Signs-Reviewed the patient's vital signs.  Patient Vitals for the past 12 hrs:   Temp Pulse Resp BP SpO2   05/12/16 0222 97.7 ??F (36.5 ??C) 73 18 121/66 100 %     Records Reviewed: Nursing Notes and Old Medical Records    Provider Notes (Medical Decision Making):   DDx: knee sprain, knee strain, general weakness, intoxication, malingering.     ED Course:   Initial assessment performed. The patients presenting problems have been discussed, and they are in agreement with the care plan formulated and outlined with them.  I have encouraged them to ask questions as they arise throughout their visit.      Disposition:  DISCHARGE NOTE  3:14 AM  The patient has been re-evaluated and is ready for discharge. Reviewed available results with patient. Counseled pt on diagnosis and care plan. Pt has expressed understanding, and all questions have been answered. Pt agrees with plan and agrees to F/U as recommended, or return to the ED if their sxs worsen. Discharge instructions have been provided and explained to the pt, along with reasons to return to the ED.  Written by Addison Naegeli, ED Scribe, as dictated by Marylu Lund, DO.     PLAN:  1.    Discharge Medication List as of 05/12/2016  3:08 AM        2.   Follow-up Information     Follow up With Details Comments Contact Info    Alysha Z. Doreatha Massed, NP Schedule an appointment as soon as possible for a visit  [address]  [address] [place] [address]  [phone]          Return to ED if worse     Diagnosis     Clinical Impression:   1. Acute pain of right knee        Attestations:    This note is prepared by Addison NaegeliJulian Radford, acting as Scribe for Marylu LundAaron Thane Age, DO.    Marylu LundAaron Derreon Consalvo, DO: The scribe's documentation has been prepared under my direction and personally reviewed by me in its entirety. I confirm that the note above accurately reflects all work, treatment, procedures, and medical decision making performed by me.

## 2016-08-07 ENCOUNTER — Encounter: Attending: Family | Primary: Family

## 2016-08-10 ENCOUNTER — Ambulatory Visit: Admit: 2016-08-10 | Discharge: 2016-08-10 | Payer: PRIVATE HEALTH INSURANCE | Attending: Family | Primary: Family

## 2016-08-10 DIAGNOSIS — Z7689 Persons encountering health services in other specified circumstances: Secondary | ICD-10-CM

## 2016-08-10 LAB — AMB POC URINALYSIS DIP STICK AUTO W/O MICRO
Bilirubin (UA POC): NEGATIVE
Blood (UA POC): NEGATIVE
Glucose (UA POC): NEGATIVE
Ketones (UA POC): NEGATIVE
Leukocyte esterase (UA POC): NEGATIVE
Nitrites (UA POC): NEGATIVE
Protein (UA POC): NEGATIVE
Specific gravity (UA POC): 1.025 (ref 1.001–1.035)
Urobilinogen (UA POC): 0.2 (ref 0.2–1)
pH (UA POC): 5.5 (ref 4.6–8.0)

## 2016-08-10 NOTE — Progress Notes (Signed)
Chief Complaint   Patient presents with   ??? Establish Care     Last seizure was several months ago. Neurologist name is is Lorie PhenixKenichird Ono MD    Order placed for Tdap & Pneumo 23 [facility] 08/10/2016 due to HM.

## 2016-08-10 NOTE — Progress Notes (Signed)
HISTORY OF PRESENT ILLNESS  Connor Prince is a 31 y.o. male.  HPI  Patient comes in today to establish care.  Presents to office today with Arline Asp - case Production designer, theatre/television/film.  He moved here from [place] several months ago where he was living with his grandmother (who has passed). He has a brother and grandfather in [place]. [place], however, he is not very close with them.   Patient was staying with friends, then eventually became homeless.  Patient was stating at that time he was depressed because he was homeless and he was placed in behavioral health services.  Patient is currently living with friends.  Was seeing psych with [facility].  Not takin zyprexa or Prozac.  Patient appears confused about the depression medications.  Social worker looking for psych.  Patient states he is not depressed or feel down.  He gets up and hangs out with friends.  Will watch TV during day.  Will go to [employer] at times.  Appetite good.  Has good access to food.  Drinks water and soda.    Hx of seizures.  Neurologist - Lorie Phenix MD at [facility].  Last visit on Tuesday 08/07/16.  Increased depakote 500mg  to 4 tablets at night.  Patient had been out of medication previously.  Needs to go next month for EEG at [facility] [place] [facility]  No Known Allergies    Past Medical History:   Diagnosis Date   ??? Asthma    ??? Chest pain    ??? Cough    ??? EP (epilepsy) (HCC)    ??? Kidney stone    ??? Migraine    ??? Muscle pain    ??? Seizures (HCC)    ??? Skipped beats    ??? SOB (shortness of breath)        History reviewed. No pertinent surgical history.    Social History     Social History   ??? Marital status: SINGLE     Spouse name: N/A   ??? Number of children: N/A   ??? Years of education: N/A     Occupational History   ??? Not on file.     Social History Main Topics   ??? Smoking status: Current Some Day Smoker     Packs/day: 0.25   ??? Smokeless tobacco: Never Used      Comment: approx 2-3   ??? Alcohol use Yes      Comment: Occassionally   ??? Drug use: No    ??? Sexual activity: Yes     Partners: Male     Other Topics Concern   ??? Not on file     Social History Narrative    On disability       Family History   Problem Relation Age of Onset   ??? Cancer Mother      stomach   ??? Migraines Mother    ??? Seizures Mother        Current Outpatient Prescriptions   Medication Sig   ??? divalproex DR (DEPAKOTE) 500 mg tablet Take 500 mg by mouth four (4) times daily.     No current facility-administered medications for this visit.      Review of Systems   Constitutional: Negative for chills, diaphoresis, fever, malaise/fatigue and weight loss.   HENT: Negative for congestion, ear pain, sore throat and tinnitus.    Eyes: Negative for blurred vision and double vision.   Respiratory: Negative for cough, sputum production, shortness of breath and wheezing.  Cardiovascular: Negative for chest pain, palpitations and leg swelling.   Gastrointestinal: Negative for abdominal pain, blood in stool, constipation, diarrhea, nausea and vomiting.   Genitourinary: Negative for dysuria, flank pain, frequency, hematuria and urgency.   Musculoskeletal: Negative for back pain, joint pain and myalgias.   Skin: Negative.    Neurological: Positive for seizures (no seizure in 3-4 months per patient). Negative for dizziness, tingling, sensory change, speech change, focal weakness and headaches.   Psychiatric/Behavioral: Negative for depression. The patient is not nervous/anxious and does not have insomnia.      Vitals:    08/10/16 0934   BP: 108/63   Pulse: 74   Resp: 18   Temp: 98 ??F (36.7 ??C)   TempSrc: Oral   SpO2: 99%   Weight: 214 lb (97.1 kg)   Height: 6\' 6"  (1.981 m)     Physical Exam   Constitutional: He is oriented to person, place, and time. Vital signs are normal. He appears well-developed and well-nourished. He is cooperative.   HENT:   Right Ear: Hearing, tympanic membrane, external ear and ear canal normal.   Left Ear: Hearing, tympanic membrane, external ear and ear canal normal.    Nose: Nose normal.   Mouth/Throat: Uvula is midline, oropharynx is clear and moist and mucous membranes are normal.   Cardiovascular: Normal rate, regular rhythm and normal heart sounds.    Pulmonary/Chest: Effort normal and breath sounds normal.   Abdominal: Soft. Normal appearance and bowel sounds are normal. There is no hepatosplenomegaly. There is no tenderness. There is no CVA tenderness.   Lymphadenopathy:        Head (right side): No submental, no submandibular and no tonsillar adenopathy present.        Head (left side): No submental, no submandibular and no tonsillar adenopathy present.     He has no cervical adenopathy.   Neurological: He is alert and oriented to person, place, and time.   Skin: Skin is warm, dry and intact.   Psychiatric: He has a normal mood and affect. Judgment and thought content normal. His speech is delayed. He is slowed.     ASSESSMENT and PLAN    ICD-10-CM ICD-9-CM    1. Encounter for immunization Z23 V03.89 TETANUS, DIPHTHERIA TOXOIDS AND ACELLULAR PERTUSSIS VACCINE (TDAP), IN INDIVIDS. >=7, IM      PNEUMOCOCCAL POLYSACCHARIDE VACCINE, 23-VALENT, ADULT OR IMMUNOSUPPRESSED PT DOSE,      PR IMMUNIZ ADMIN,1 SINGLE/COMB VAC/TOXOID   2. Encounter to establish care Z76.89 V65.8 CBC WITH AUTOMATED DIFF      METABOLIC PANEL, COMPREHENSIVE      VALPROIC ACID      LIPID PANEL      VITAMIN D, 25 HYDROXY      AMB POC URINALYSIS DIP STICK AUTO W/O MICRO   3. Seizures (HCC) R56.9 780.39 VALPROIC ACID     Encounter Diagnoses   Name Primary?   ??? Encounter for immunization Yes   ??? Encounter to establish care    ??? Seizures (HCC)      Orders Placed This Encounter   ??? Tetanus, diphtheria toxoids and acellular pertussis (TDAP) vaccine, in individuals >=7 years, IM   ??? Pneumococcal polysaccharide vaccine, 23-valent, adult or immunosuppressed pt dose   ??? CBC WITH AUTOMATED DIFF   ??? METABOLIC PANEL, COMPREHENSIVE   ??? VALPROIC ACID   ??? LIPID PANEL   ??? VITAMIN D, 25 HYDROXY    ??? AMB POC URINALYSIS DIP STICK AUTO W/O MICRO   ??? DISCONTD:  FLUoxetine (PROZAC) 20 mg capsule   ??? DISCONTD: OLANZapine (ZYPREXA) 5 mg tablet   ??? divalproex DR (DEPAKOTE) 500 mg tablet     Diagnoses and all orders for this visit:    1. Encounter to establish care  -     CBC WITH AUTOMATED DIFF  -     METABOLIC PANEL, COMPREHENSIVE  -     VALPROIC ACID  -     LIPID PANEL  -     VITAMIN D, 25 HYDROXY  -     AMB POC URINALYSIS DIP STICK AUTO W/O MICRO    2. Seizures (HCC)- continue follow up with neuro at [facility] - Dr. Lindell Spar  -     VALPROIC ACID    3. Encounter for immunization  -     Tetanus, diphtheria toxoids and acellular pertussis (TDAP) vaccine, in individuals >=7 years, IM  -     Pneumococcal polysaccharide vaccine, 23-valent, adult or immunosuppressed pt dose  -     PR IMMUNIZ ADMIN,1 SINGLE/COMB VAC/TOXOID      Follow-up Disposition:  Return in about 1 year (around 08/10/2017).  lab results and schedule of future lab studies reviewed with patient  reviewed diet, exercise and weight control    I have reviewed the patient's allergies and made any necessary changes. Medical, procedural, social and family histories have been reviewed and updated as medically indicated. I have reconciled and/or revised patient medications in the EMR.       I have discussed each diagnosis listed in this note with [name] and/or their family. I have discussed treatment options and the risk/benefit analysis of those options, including safe use of medications and possible medication side effects.  Through the use of shared decision making we have agreed to the above plan.      The patient has received an after-visit summary and questions were answered concerning future plans.     Lam Bjorklund C. Wesleigh Markovic, FNP-C    This note will not be viewable in MyChart.

## 2016-08-10 NOTE — Patient Instructions (Signed)
Vaccine Information Statement    Pneumococcal Polysaccharide Vaccine: What You Need to Know    Many Vaccine Information Statements are available in Spanish and other languages. See www.immunize.org/vis.  Hojas de informaci??n Sobre Vacunas est??n disponibles en espa??ol y en muchos otros idiomas. Visite http://www.immunize.org/vis.    1. Why get vaccinated?    Vaccination can protect older adults (and some children and younger adults) from pneumococcal disease.    Pneumococcal disease is caused by bacteria that can spread from person to person through close contact.  It can cause ear infections, and it can also lead to more serious infections of the:  ??? Lungs (pneumonia),  ??? Blood (bacteremia), and  ??? Covering of the brain and spinal cord (meningitis). Meningitis can cause deafness and brain damage, and it can be fatal.      Anyone can get pneumococcal disease, but children under 2 years of age, people with certain medical conditions, adults over 65 years of age, and cigarette smokers are at the highest risk.    About 18,000 older adults die each year from pneumococcal disease in the [place].    Treatment of pneumococcal infections with penicillin and other drugs used to be more effective. But some strains of the disease have become resistant to these drugs. This makes prevention of the disease, through vaccination, even more important.    2. Pneumococcal polysaccharide vaccine (PPSV23)    Pneumococcal polysaccharide vaccine (PPSV23) protects against 23 types of pneumococcal bacteria. It will not prevent all pneumococcal disease.    PPSV23 is recommended for:  ??? All adults 65 years of age and older,  ??? Anyone 2 through 29 years of age with certain long-term health problems,  ??? Anyone 2 through 29 years of age with a weakened immune system,  ??? Adults 19 through 29 years of age who smoke cigarettes or have asthma.     Most people need only one dose of PPSV.  A second dose is recommended for  certain high-risk groups.  People 65 and older should get a dose even if they have gotten one or more doses of the vaccine before they turned 65.    Your healthcare provider can give you more information about these recommendations.    Most healthy adults develop protection within 2 to 3 weeks of getting the shot.     3. Some people should not get this vaccine    ??? Anyone who has had a life-threatening allergic reaction to PPSV should not get another dose.    ??? Anyone who has a severe allergy to any component of PPSV should not receive it. Tell your provider if you have any severe allergies.    ??? Anyone who is moderately or severely ill when the shot is scheduled may be asked to wait until they recover before getting the vaccine. Someone with a mild illness can usually be vaccinated.    ??? Children less than 2 years of age should not receive this vaccine.    ??? There is no evidence that PPSV is harmful to either a pregnant woman or to her fetus. However, as a precaution, women who need the vaccine should be vaccinated before becoming pregnant, if possible.    4. Risks of a vaccine reaction    With any medicine, including vaccines, there is a chance of side effects. These are usually mild and go away on their own, but serious reactions are also possible.     About half of people who get PPSV   have mild side effects, such as redness or pain where the shot is given, which go away within about two days.    Less than 1 out of 100 people develop a fever, muscle aches, or more severe local reactions.    Problems that could happen after any vaccine:    ??? People sometimes faint after a medical procedure, including vaccination. Sitting or lying down for about 15 minutes can help prevent fainting, and injuries caused by a fall. Tell your doctor if you feel dizzy, or have vision changes or ringing in the ears.    ??? Some people get severe pain in the shoulder and have difficulty moving  the arm where a shot was given. This happens very rarely.    ??? Any medication can cause a severe allergic reaction. Such reactions from a vaccine are very rare, estimated at about 1 in a million doses, and would happen within a few minutes to a few hours after the vaccination.     As with any medicine, there is a very remote chance of a vaccine causing a serious injury or death.    The safety of vaccines is always being monitored.  For more information, visit: www.cdc.gov/vaccinesafety/     5. What if there is a serious reaction?    What should I look for?    Look for anything that concerns you, such as signs of a severe allergic reaction, very high fever, or unusual behavior.    Signs of a severe allergic reaction can include hives, swelling of the face and throat, difficulty breathing, a fast heartbeat, dizziness, and weakness. These would usually start a few minutes to a few hours after the vaccination.    What should I do?    If you think it is a severe allergic reaction or other emergency that can???t wait, call 9-1-1 or get to the nearest hospital. Otherwise, call your doctor.    Afterward, the reaction should be reported to the Vaccine Adverse Event Reporting System (VAERS). Your doctor might file this report, or you can do it yourself through the VAERS web site at www.vaers.hhs.gov, or by calling 1-800-822-7967.     VAERS does not give medical advice.    6. How can I learn more?    ??? Ask your doctor. He or she can give you the vaccine package insert or suggest other sources of information.  ??? Call your local or state health department.  ??? Contact the Centers for Disease Control and Prevention (CDC):  - Call 1-800-232-4636 (1-800-CDC-INFO) or  - Visit CDC???s website at www.cdc.gov/vaccines    Vaccine Information Statement   PPSV   06/19/2013    Department of Health and Human Services  Centers for Disease Control and Prevention    Office Use Only    Vaccine Information Statement      Tdap (Tetanus, Diphtheria, Pertussis) Vaccine: What You Need to Know    Many Vaccine Information Statements are available in Spanish and other languages. See www.immunize.org/vis.  Hojas de Informaci??n Sobre Vacunas est??n disponibles en espa??ol y en muchos otros idiomas. Visite http://www.immunize.org/vis    1. Why get vaccinated?    Tetanus, diphtheria, and pertussis are very serious diseases. Tdap vaccine can protect us from these diseases.  And, Tdap vaccine given to pregnant women can protect newborn babies against pertussis.    TETANUS (Lockjaw) is rare in the [place] today. It causes painful muscle tightening and stiffness, usually all over the body.  ? It can lead   to tightening of muscles in the head and neck so you can???t open your mouth, swallow, or sometimes even breathe. Tetanus kills about 1 out of 10 people who are infected even after receiving the best medical care.      DIPHTHERIA is also rare in the [place] today.  It can cause a thick coating to form in the back of the throat.  ? It can lead to breathing problems, heart failure, paralysis, and death.    PERTUSSIS (Whooping Cough) causes severe coughing spells, which can cause difficulty breathing, vomiting, and disturbed sleep.  ? It can also lead to weight loss, incontinence, and rib fractures. Up to 2 in 100 adolescents and 5 in 100 adults with pertussis are hospitalized or have complications, which could include pneumonia or death.     These diseases are caused by bacteria. Diphtheria and pertussis are spread from person to person through secretions from coughing or sneezing. Tetanus enters the body through cuts, scratches, or wounds.    Before vaccines, as many as 200,000 cases of diphtheria, 200,000 cases of pertussis, and hundreds of cases of tetanus, were reported in the [place] each year. Since vaccination began, reports of cases for tetanus and diphtheria have dropped by about 99% and for pertussis by about 80%.     2. Tdap vaccine    Tdap vaccine can protect adolescents and adults from tetanus, diphtheria, and pertussis. One dose of Tdap is routinely given at age 11 or 12.  People who did not get Tdap at that age should get it as soon as possible.    Tdap is especially important for health care professionals and anyone having close contact with a baby younger than 12 months.      Pregnant women should get a dose of Tdap during every pregnancy, to protect the newborn from pertussis.  Infants are most at risk for severe, life-threatening complications from pertussis.    Another vaccine, called Td, protects against tetanus and diphtheria, but not pertussis. A Td booster should be given every 10 years. Tdap may be given as one of these boosters if you have never gotten Tdap before.  Tdap may also be given after a severe cut or burn to prevent tetanus infection.    Your doctor or the person giving you the vaccine can give you more information.    Tdap may safely be given at the same time as other vaccines.    3. Some people should not get this vaccine    ??? A person who has ever had a life-threatening allergic reaction after a previous dose of any diphtheria, tetanus or pertussis containing vaccine, OR has a severe allergy to any part of this vaccine, should not get Tdap vaccine.  Tell the person giving the vaccine about any severe allergies.    ??? Anyone who had coma or long repeated seizures within 7 days after a childhood dose of DTP or DTaP, or a previous dose of Tdap, should not get Tdap, unless a cause other than the vaccine was found.  They can still get Td.    ??? Talk to your doctor if you:  - have seizures or another nervous system problem,  - had severe pain or swelling after any vaccine containing diphtheria, tetanus or pertussis,   - ever had a condition called Guillain Barr?? Syndrome (GBS),  - aren???t feeling well on the day the shot is scheduled.    4. Risks     With any medicine, including   vaccines, there is a chance of side effects. These are usually mild and go away on their own. Serious reactions are also possible but are rare.     Most people who get Tdap vaccine do not have any problems with it.    Mild Problems following Tdap  (Did not interfere with activities)  ??? Pain where the shot was given (about 3 in 4 adolescents or 2 in 3 adults)  ??? Redness or swelling where the shot was given (about 1 person in 5)  ??? Mild fever of at least 100.4??F (up to about 1 in 25 adolescents or 1 in 100 adults)  ??? Headache (about 3 or 4 people in 10)  ??? Tiredness (about 1 person in 3 or 4)  ??? Nausea, vomiting, diarrhea, stomach ache (up to 1 in 4 adolescents or 1 in 10 adults)  ??? Chills,  sore joints (about 1 person in 10)  ??? Body aches (about 1 person in 3 or 4)   ??? Rash, swollen glands (uncommon)    Moderate Problems following Tdap  (Interfered with activities, but did not require medical attention)  ??? Pain where the shot was given (up to 1 in 5 or 6)   ??? Redness or swelling where the shot was given (up to about 1 in 16 adolescents or 1 in 12 adults)  ??? Fever over 102??F (about 1 in 100 adolescents or 1 in 250 adults)  ??? Headache (about 1 in 7 adolescents or 1 in 10 adults)  ??? Nausea, vomiting, diarrhea, stomach ache (up to 1 or 3 people in 100)  ??? Swelling of the entire arm where the shot was given (up to about 1 in 500).     Severe Problems following Tdap  (Unable to perform usual activities; required medical attention)  ??? Swelling, severe pain, bleeding, and redness in the arm where the shot was given (rare).    Problems that could happen after any vaccine:    ??? People sometimes faint after a medical procedure, including vaccination. Sitting or lying down for about 15 minutes can help prevent fainting, and injuries caused by a fall. Tell your doctor if you feel dizzy, or have vision changes or ringing in the ears.     ??? Some people get severe pain in the shoulder and have difficulty moving the arm where a shot was given. This happens very rarely.    ??? Any medication can cause a severe allergic reaction. Such reactions from a vaccine are very rare, estimated at fewer than 1 in a million doses, and would happen within a few minutes to a few hours after the vaccination.     As with any medicine, there is a very remote chance of a vaccine causing a serious injury or death.    The safety of vaccines is always being monitored. For more information, visit: www.cdc.gov/vaccinesafety/    5. What if there is a serious problem?    What should I look for?  ??? Look for anything that concerns you, such as signs of a severe allergic reaction, very high fever, or unusual behavior.    ??? Signs of a severe allergic reaction can include hives, swelling of the face and throat, difficulty breathing, a fast heartbeat, dizziness, and weakness. These would usually start a few minutes to a few hours after the vaccination.    What should I do?  ??? If you think it is a severe allergic reaction or other emergency that can???t wait, call   9-1-1 or get the person to the nearest hospital. Otherwise, call your doctor.    ??? Afterward, the reaction should be reported to the Vaccine Adverse Event Reporting System (VAERS). Your doctor might file this report, or you can do it yourself through the VAERS web site at www.vaers.hhs.gov, or by calling 1-800-822-7967.    VAERS does not give medical advice.    6. The National Vaccine Injury Compensation Program    The National Vaccine Injury Compensation Program (VICP) is a federal program that was created to compensate people who may have been injured by certain vaccines.    Persons who believe they may have been injured by a vaccine can learn about the program and about filing a claim by calling 1-800-338-2382 or visiting the VICP website at www.hrsa.gov/vaccinecompensation. There is a  time limit to file a claim for compensation.     7. How can I learn more?    ??? Ask your doctor. He or she can give you the vaccine package insert or suggest other sources of information.  ??? Call your local or state health department.  ??? Contact the Centers for Disease Control and Prevention (CDC):  - Call 1-800-232-4636 (1-800-CDC-INFO) or  - Visit CDC???s website at www.cdc.gov/vaccines      Vaccine Information Statement   Tdap Vaccine  (04/21/2013)  42 [place] ?? [id number]    Department of Health and Human Services  Centers for Disease Control and Prevention    Office Use Only

## 2016-08-11 LAB — METABOLIC PANEL, COMPREHENSIVE
A-G Ratio: 1.5 (ref 1.2–2.2)
ALT (SGPT): 13 IU/L (ref 0–44)
AST (SGOT): 12 IU/L (ref 0–40)
Albumin: 4.2 g/dL (ref 3.5–5.5)
Alk. phosphatase: 54 IU/L (ref 39–117)
BUN/Creatinine ratio: 18 (ref 9–20)
BUN: 19 mg/dL (ref 6–20)
Bilirubin, total: 0.4 mg/dL (ref 0.0–1.2)
CO2: 26 mmol/L (ref 20–29)
Calcium: 9.2 mg/dL (ref 8.7–10.2)
Chloride: 102 mmol/L (ref 96–106)
Creatinine: 1.03 mg/dL (ref 0.76–1.27)
GFR est AA: 114 mL/min/{1.73_m2} (ref >59)
GFR est non-AA: 98 mL/min/{1.73_m2} (ref >59)
GLOBULIN, TOTAL: 2.8 g/dL (ref 1.5–4.5)
Glucose: 80 mg/dL (ref 65–99)
Potassium: 4.5 mmol/L (ref 3.5–5.2)
Protein, total: 7 g/dL (ref 6.0–8.5)
Sodium: 141 mmol/L (ref 134–144)

## 2016-08-11 LAB — CBC WITH AUTOMATED DIFF
ABS. BASOPHILS: 0 10*3/uL (ref 0.0–0.2)
ABS. EOSINOPHILS: 0 10*3/uL (ref 0.0–0.4)
ABS. IMM. GRANS.: 0 10*3/uL (ref 0.0–0.1)
ABS. MONOCYTES: 0.4 10*3/uL (ref 0.1–0.9)
ABS. NEUTROPHILS: 3.4 10*3/uL (ref 1.4–7.0)
Abs Lymphocytes: 3.1 10*3/uL (ref 0.7–3.1)
BASOPHILS: 0 %
EOSINOPHILS: 0 %
HCT: 36 % — ABNORMAL LOW (ref 37.5–51.0)
HGB: 11.2 g/dL — ABNORMAL LOW (ref 13.0–17.7)
IMMATURE GRANULOCYTES: 0 %
Lymphocytes: 45 %
MCH: 24.8 pg — ABNORMAL LOW (ref 26.6–33.0)
MCHC: 31.1 g/dL — ABNORMAL LOW (ref 31.5–35.7)
MCV: 80 fL (ref 79–97)
MONOCYTES: 5 %
NEUTROPHILS: 50 %
PLATELET: 199 10*3/uL (ref 150–379)
RBC: 4.51 x10E6/uL (ref 4.14–5.80)
RDW: 17.4 % — ABNORMAL HIGH (ref 12.3–15.4)
WBC: 6.9 10*3/uL (ref 3.4–10.8)

## 2016-08-11 LAB — LIPID PANEL
Cholesterol, total: 150 mg/dL (ref 100–199)
HDL Cholesterol: 49 mg/dL (ref >39)
LDL, calculated: 92 mg/dL (ref 0–99)
Triglyceride: 43 mg/dL (ref 0–149)
VLDL, calculated: 9 mg/dL (ref 5–40)

## 2016-08-11 LAB — VITAMIN D, 25 HYDROXY: VITAMIN D, 25-HYDROXY: 17.2 ng/mL — ABNORMAL LOW (ref 30.0–100.0)

## 2016-08-11 LAB — VALPROIC ACID: Valproic acid: 106 ug/mL — ABNORMAL HIGH (ref 50–100)

## 2016-08-11 LAB — CVD REPORT

## 2016-08-14 ENCOUNTER — Encounter

## 2016-08-14 MED ORDER — ERGOCALCIFEROL (VITAMIN D2) 50,000 UNIT CAP
1250 mcg (50,000 unit) | ORAL_CAPSULE | ORAL | 2 refills | Status: DC
Start: 2016-08-14 — End: 2016-10-12

## 2016-10-12 ENCOUNTER — Ambulatory Visit: Admit: 2016-10-12 | Discharge: 2016-10-12 | Payer: PRIVATE HEALTH INSURANCE | Attending: Family | Primary: Family

## 2016-10-12 DIAGNOSIS — R569 Unspecified convulsions: Secondary | ICD-10-CM

## 2016-10-12 LAB — AMB POC COMPLETE CBC,AUTOMATED ENTER

## 2016-10-12 NOTE — Progress Notes (Signed)
Chief Complaint   Patient presents with   ??? Follow-up     ER - [facility]     Patient here for follow up to ER - [facility] on 10/06/16 for seizure.  Patient is in attendance with Case Manager.

## 2016-10-12 NOTE — Progress Notes (Signed)
HISTORY OF PRESENT ILLNESS  Connor Prince is a 31 y.o. male.  HPI  Patient of Rica Koyanagi with PMHx significant for seizures is here today for an ER follow up. He is here with his case Production designer, theatre/television/film.  He was seen at [facility] ED on 10/06/16 after having a GLF which was believed to be related to a seizure. He had missed a few doses of his depakote, which was believed to the cause of the fall. He had a normal CT scan and denies any residual effects.   He last saw his neurologist (at [facility]) in June 2018, but recently missed an appt for an EEG. He does not yet have neurology follow up appointment scheduled.   He denies any new or worsening symptoms or recurrence of seizure activity.    Anemia was also noted at the ED, per review of note from ED provider. He denies any abnormal bleeding. The resident's note noticed a 2-pt hemoglobin drop in the previous 2 months. Hgb here from 07/2016 was 11.2. He denies any unusual bleeding, lightheadedness, shortness of breath.    Past Medical History:   Diagnosis Date   ??? Asthma    ??? Chest pain    ??? Cough    ??? EP (epilepsy) (HCC)    ??? Kidney stone    ??? Migraine    ??? Muscle pain    ??? Seizures (HCC)    ??? Skipped beats    ??? SOB (shortness of breath)    ??? Vitamin D deficiency 08/14/2016     History reviewed. No pertinent surgical history.  Family History   Problem Relation Age of Onset   ??? Cancer Mother      stomach   ??? Migraines Mother    ??? Seizures Mother    ??? No Known Problems Father      Social History     Social History   ??? Marital status: SINGLE     Spouse name: N/A   ??? Number of children: N/A   ??? Years of education: N/A     Social History Main Topics   ??? Smoking status: Former Smoker     Packs/day: 0.25   ??? Smokeless tobacco: Never Used      Comment: approx 2-3   ??? Alcohol use Yes      Comment: Occassionally   ??? Drug use: No   ??? Sexual activity: Yes     Partners: Male     Birth control/ protection: None     Other Topics Concern   ??? None     Social History Narrative    On disability      Patient Active Problem List   Diagnosis Code   ??? Seizures (HCC) R56.9   ??? Vitamin D deficiency E55.9     Outpatient Encounter Prescriptions as of 10/12/2016   Medication Sig Dispense Refill   ??? divalproex DR (DEPAKOTE) 500 mg tablet Take 2,000 mg by mouth daily.     ??? [DISCONTINUED] ondansetron (ZOFRAN ODT) 4 mg disintegrating tablet DISSOLVE ONE TABLET BY MOUTH EVERY 6 HOURS AS NEEDED FOR NAUSEA/VOMITING  0   ??? [DISCONTINUED] ergocalciferol (ERGOCALCIFEROL) 50,000 unit capsule Take 1 Cap by mouth every seven (7) days. 4 Cap 2     No facility-administered encounter medications on file as of 10/12/2016.      Visit Vitals   ??? BP 109/59 (BP 1 Location: Right arm, BP Patient Position: Sitting)   ??? Pulse 83   ??? Temp 97.7 ??F (36.5 ??  C) (Oral)   ??? Resp 16   ??? Ht 6\' 6"  (1.981 m)   ??? Wt 210 lb (95.3 kg)   ??? SpO2 96%   ??? BMI 24.27 kg/m2          Review of Systems   Constitutional: Negative for chills, fever and malaise/fatigue.   HENT: Negative for nosebleeds.    Eyes: Negative for blurred vision and double vision.   Respiratory: Negative for cough and shortness of breath.    Cardiovascular: Negative for chest pain, palpitations, orthopnea and leg swelling.   Gastrointestinal: Negative for blood in stool.   Genitourinary: Negative for hematuria.   Neurological: Positive for seizures. Negative for dizziness and headaches.   Endo/Heme/Allergies: Does not bruise/bleed easily.       Physical Exam   Constitutional: He is oriented to person, place, and time. He appears well-developed and well-nourished. No distress.   HENT:   Head: Normocephalic and atraumatic.   Cardiovascular: Normal rate, regular rhythm, normal heart sounds and intact distal pulses.    Pulmonary/Chest: Effort normal and breath sounds normal. No respiratory distress. He has no wheezes.   Genitourinary: Rectal exam shows guaiac positive stool. Rectal exam shows no external hemorrhoid, no internal hemorrhoid, no mass and no tenderness.    Neurological: He is alert and oriented to person, place, and time.   Skin: Skin is warm and dry. He is not diaphoretic. No pallor.   Psychiatric: He has a normal mood and affect. His behavior is normal. Judgment normal.   Nursing note and vitals reviewed.      ASSESSMENT and PLAN    ICD-10-CM ICD-9-CM    1. Seizures (HCC) R56.9 780.39    2. Anemia, unspecified type D64.9 285.9 METABOLIC PANEL, COMPREHENSIVE      VITAMIN B12 & FOLATE      IRON PROFILE      FERRITIN      REFERRAL TO GASTROENTEROLOGY      AMB POC COMPLETE CBC,AUTOMATED ENTER      CANCELED: CBC WITH AUTOMATED DIFF     1. Seizures - Encouraged patient/case manager to reschedule appointment with neurology as soon as possible, and encouraged increased adherence to medication schedule.  2. Anemia - Heme-positive stool today. Hgb has rebounded to 10.7 today (up from 8.8 in the ED on 8/11), but appt set up for patient with GI on 10/25/16; patient aware. Will check remainder of anemia labs today and call patient with results next week. ED precautions were stressed, and the patient and his case worker verbalize understanding of and agreement with the plan of care.    Follow up TBD by lab results.     Follow-up Disposition:  Return if symptoms worsen or fail to improve, for or tbd by lab results.   Wynona Canes, NP

## 2016-10-12 NOTE — Patient Instructions (Signed)
Anemia: Care Instructions  Your Care Instructions    Anemia is a low level of red blood cells, which carry oxygen throughout your body. Many things can cause anemia. Lack of iron is one of the most common causes. Your body needs iron to make hemoglobin, a substance in red blood cells that carries oxygen from the lungs to your body's cells. Without enough iron, the body produces fewer and smaller red blood cells. As a result, your body's cells do not get enough oxygen, and you feel tired and weak. And you may have trouble concentrating.  Bleeding is the most common cause of a lack of iron. You may have heavy menstrual bleeding or bleeding caused by conditions such as ulcers, hemorrhoids, or cancer. Regular use of aspirin or other anti-inflammatory medicines (such as ibuprofen) also can cause bleeding in some people. A lack of iron in your diet also can cause anemia, especially at times when the body needs more iron, such as during pregnancy, infancy, and the teen years.  Your doctor may have prescribed iron pills. It may take several months of treatment for your iron levels to return to normal. Your doctor also may suggest that you eat foods that are rich in iron, such as meat and beans.  There are many other causes of anemia. It is not always due to a lack of iron. Finding the specific cause of your anemia will help your doctor find the right treatment for you.  Follow-up care is a key part of your treatment and safety. Be sure to make and go to all appointments, and call your doctor if you are having problems. It's also a good idea to know your test results and keep a list of the medicines you take.  How can you care for yourself at home?  ?? Take your medicines exactly as prescribed. Call your doctor if you think you are having a problem with your medicine.  ?? If your doctor recommends iron pills, take them as directed:  ?? Try to take the pills on an empty stomach about 1 hour before or 2 hours  after meals. But you may need to take iron with food to avoid an upset stomach.  ?? Do not take antacids or drink milk or caffeine drinks (such as coffee, tea, or cola) at the same time or within 2 hours of the time that you take your iron. They can make it hard for your body to absorb the iron.  ?? Vitamin C (from food or supplements) helps your body absorb iron. Try taking iron pills with a glass of orange juice or some other food that is high in vitamin C, such as citrus fruits.  ?? Iron pills may cause stomach problems, such as heartburn, nausea, diarrhea, constipation, and cramps. Be sure to drink plenty of fluids, and include fruits, vegetables, and fiber in your diet each day. Iron pills often make your bowel movements dark or green.  ?? If you forget to take an iron pill, do not take a double dose of iron the next time you take a pill.  ?? Keep iron pills out of the reach of small children. An overdose of iron can be very dangerous.  ?? Follow your doctor's advice about eating iron-rich foods. These include red meat, shellfish, poultry, eggs, beans, raisins, whole-grain bread, and leafy green vegetables.  ?? Steam vegetables to help them keep their iron content.  When should you call for help?  Call 911 anytime you think you   may need emergency care. For example, call if:  ?? ?? You have symptoms of a heart attack. These may include:  ?? Chest pain or pressure, or a strange feeling in the chest.  ?? Sweating.  ?? Shortness of breath.  ?? Nausea or vomiting.  ?? Pain, pressure, or a strange feeling in the back, neck, jaw, or upper belly or in one or both shoulders or arms.  ?? Lightheadedness or sudden weakness.  ?? A fast or irregular heartbeat.  After you call 911, the operator may tell you to chew 1 adult-strength or 2 to 4 low-dose aspirin. Wait for an ambulance. Do not try to drive yourself.   ?? ?? You passed out (lost consciousness).   ??Call your doctor now or seek immediate medical care if:   ?? ?? You have new or increased shortness of breath.   ?? ?? You are dizzy or lightheaded, or you feel like you may faint.   ?? ?? Your fatigue and weakness continue or get worse.   ?? ?? You have any abnormal bleeding, such as:  ?? Nosebleeds.  ?? Vaginal bleeding that is different (heavier, more frequent, at a different time of the month) than what you are used to.  ?? Bloody or black stools, or rectal bleeding.  ?? Bloody or pink urine.   ??Watch closely for changes in your health, and be sure to contact your doctor if:  ?? ?? You do not get better as expected.   Where can you learn more?  Go to http://www.healthwise.net/[employer].  Enter R301 in the search box to learn more about "Anemia: Care Instructions."  Current as of: December 05, 2015  Content Version: 11.7  ?? 2006-2018 [employer]. Care instructions adapted under license by [employer] (which disclaims liability or warranty for this information). If you have questions about a medical condition or this instruction, always ask your healthcare professional. [employer]wise, Inc[employer] disclaims any warranty or liability for your use of this information.

## 2016-10-13 LAB — METABOLIC PANEL, COMPREHENSIVE
A-G Ratio: 1.6 (ref 1.2–2.2)
ALT (SGPT): 5 IU/L (ref 0–44)
AST (SGOT): 13 IU/L (ref 0–40)
Albumin: 4.3 g/dL (ref 3.5–5.5)
Alk. phosphatase: 52 IU/L (ref 39–117)
BUN/Creatinine ratio: 9 (ref 9–20)
BUN: 9 mg/dL (ref 6–20)
Bilirubin, total: 0.5 mg/dL (ref 0.0–1.2)
CO2: 25 mmol/L (ref 20–29)
Calcium: 9.2 mg/dL (ref 8.7–10.2)
Chloride: 102 mmol/L (ref 96–106)
Creatinine: 0.97 mg/dL (ref 0.76–1.27)
GFR est AA: 122 mL/min/{1.73_m2} (ref >59)
GFR est non-AA: 106 mL/min/{1.73_m2} (ref >59)
GLOBULIN, TOTAL: 2.7 g/dL (ref 1.5–4.5)
Glucose: 75 mg/dL (ref 65–99)
Potassium: 4.2 mmol/L (ref 3.5–5.2)
Protein, total: 7 g/dL (ref 6.0–8.5)
Sodium: 142 mmol/L (ref 134–144)

## 2016-10-13 LAB — FERRITIN: Ferritin: 118 ng/mL (ref 30–400)

## 2016-10-13 LAB — VITAMIN B12 & FOLATE
Folate: 11.5 ng/mL (ref >3.0)
Vitamin B12: 671 pg/mL (ref 232–1245)

## 2016-10-13 LAB — IRON PROFILE
Iron % saturation: 34 % (ref 15–55)
Iron: 93 ug/dL (ref 38–169)
TIBC: 272 ug/dL (ref 250–450)
UIBC: 179 ug/dL (ref 111–343)

## 2016-10-15 NOTE — Telephone Encounter (Signed)
Spoke with patient's counselor notifying her of lab results.  Patient to follow up with GI as scheduled.  Counselor to check schedule and contact office for one month follow up with either pcp or Charlsie Merles, NP.

## 2016-10-15 NOTE — Progress Notes (Signed)
Please let patient know that the remainder of his labs returned perfectly normal from last week, which is great news. It will be very important that he follow up with the GI specialist as scheduled. Please ask him to schedule a follow up appt either with me or Cordelia Pen for about 1 month from now for re-evaluation.  Thanks!  CAB

## 2016-10-15 NOTE — Telephone Encounter (Signed)
-----   Message from Wynona Canes, NP sent at 10/15/2016  8:32 AM EDT -----  Please let patient know that the remainder of his labs returned perfectly normal from last week, which is great news. It will be very important that he follow up with the GI specialist as scheduled. Please ask him to schedule a follow up appt either with me or Cordelia Pen for about 1 month from now for re-evaluation.  Thanks!  CAB

## 2016-10-17 ENCOUNTER — Encounter (INDEPENDENT_AMBULATORY_CARE_PROVIDER_SITE_OTHER): Payer: Self-pay | Admitting: Internal Medicine

## 2016-10-17 ENCOUNTER — Ambulatory Visit (INDEPENDENT_AMBULATORY_CARE_PROVIDER_SITE_OTHER): Payer: BC Managed Care – PPO | Admitting: Internal Medicine

## 2016-10-17 VITALS — BP 134/86 | HR 70 | Temp 98.1°F | Resp 15 | Wt 219.0 lb

## 2016-10-17 DIAGNOSIS — Z7689 Persons encountering health services in other specified circumstances: Secondary | ICD-10-CM

## 2016-10-17 DIAGNOSIS — F909 Attention-deficit hyperactivity disorder, unspecified type: Principal | ICD-10-CM

## 2016-10-17 DIAGNOSIS — J342 Deviated nasal septum: Secondary | ICD-10-CM

## 2016-10-17 DIAGNOSIS — Z0189 Encounter for other specified special examinations: Secondary | ICD-10-CM

## 2016-10-17 DIAGNOSIS — Z1339 Encounter for screening examination for other mental health and behavioral disorders: Secondary | ICD-10-CM

## 2016-10-17 DIAGNOSIS — Z1389 Encounter for screening for other disorder: Secondary | ICD-10-CM

## 2016-10-17 DIAGNOSIS — M79671 Pain in right foot: Secondary | ICD-10-CM

## 2016-10-17 DIAGNOSIS — F419 Anxiety disorder, unspecified: Secondary | ICD-10-CM

## 2016-10-17 DIAGNOSIS — S92901S Unspecified fracture of right foot, sequela: Secondary | ICD-10-CM

## 2016-10-17 NOTE — Progress Notes (Signed)
ID/CC:     Chief Complaint   Patient presents with   . Establish Care     hx of fracture on right foot , learning issue    . Sinus Problem     deviated septum    . Anxiety     SUBJECTIVE: Joshua Salas is a 31 year old male with h/o anxiety, panic disorder, here to establish care. 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    Broke his R foot a few years ago, 4th and 5th metatarsal, still with pain, now with pain in big toe as well, thinks likely due to change in position of foot  Wore a boot after initial fracture, thinks fracture may have needed longer treatment    Has noticed that he doesn't breathe well through L nostril, wears a nose strip at night, often breathes through mouth    Anxiety - still having occasional panic attacks, but not as severe or frequent as he had them previously  Has difficulty concentrating - sister has ADHD, dyslexia, dysgraphia, father and brother have ADHD  First panic attack age 42, had severe anxiety for years  Drinks ~300mg  of caffeine a day - one cup of coffee in am, another around 3pm, often at night when studying    No prescription medications  Takes OTC advil or tylenol PRN, rarely  Takes OTC allergy medications as needed  No h/o asthma    Exercise: 45- 90 minutes of exercise a day, combination of weightlifting and cardio  Diet: tries to eat mostly protein and vegetables, has been eating ~200g of protein a day.  Discussed with pt that he doesn't need that much protein.  Discussed balance of whole plant foods and high quality protein, low fat diet.    PMH, PSH, FH, and SH reviewed with pt and updated in chart today    Labs and chart reviewed    Patient Active Problem List    Diagnosis Date Noted   . Attention deficit hyperactivity disorder (ADHD), unspecified ADHD type 10/17/2016   . Closed fracture of right foot, sequela 10/17/2016   . Anxiety 10/17/2016   . Deviated septum 10/17/2016       Past Medical History:   Diagnosis Date   . Anxiety    .  Deviated septum    . Foot fracture, right        Social History:  Social History   Substance Use Topics   . Smoking status: Never Smoker   . Smokeless tobacco: Never Used   . Alcohol use Yes     Family History   Problem Relation Age of Onset   . ADHD Father    . ADHD Sister    . ADHD Brother      No past surgical history on file.    Allergies:  No Known Allergies    No current outpatient prescriptions on file.     No current facility-administered medications for this visit.      ROS:    Constitutional: negative for fever, chills, weight loss or weight gain  CV: negative for chest pain, palpitations  Respiratory: negative for SOB, DOE, cough  GI: negative for nausea, vomiting, abdominal pain, no diarrhea or constipation  All other systems were reviewed and are negative except as noted in HPI    Physical Exam:  OBJECTIVE: BP 134/86 (BP Location: Left arm, BP Patient Position: Sitting, BP cuff size: Regular)  Pulse 70  Temp 98.1 F (36.7 C) (  Oral)  Resp 15  Wt 99.3 kg (219 lb)  SpO2 98%   General Appearance: healthy, alert, no acute distress, pleasant affect  HEENT: PERRL, oropharynx clear  Neck: thyroid normal, no lymphadenopathy  Heart:  normal rate and regular rhythm, no murmurs, clicks, or gallops  Lungs: clear to auscultation, no wheezing or rhonchi  Abdomen soft, non-tender.    Extremities:  no edema or lymphadenopathy  Skin: no rashes or lesions, normal color  Psych: A&Ox3, normal affect    A/P:  Aadarsh was seen today for establish care, sinus problem, anxiety    Diagnoses and all orders for this visit:    Attention deficit hyperactivity disorder (ADHD), unspecified ADHD type - has symptoms of, difficulty concentrating, has gotten worse since starting law school. Requesting official testing.  -     Psychiatry Clinic    Closed fracture of right foot, sequela - new pain in 1st toe likely to do postural shift from 4th and 5th metatarsal fractures.  Will have pt see Ortho, xray today.  -     X-Ray Foot 2  Views - Right; Future  -     Orthopedics Clinic    Anxiety - improving, lessening frequency of panic attacks.  No longer requiring medication (briefly took ativan PRN).  Discussed meditation with pt, will try Headspace app.  Denies symptoms of depression.    Deviated septum - referral for evaluation.  -     ENT Clinic    Encounter to establish care with new doctor    Routine lab draw  -     Comprehensive Metabolic Panel Green Plasma Separator Tube; Future  -     CBC w/ Diff Lavender; Future  -     Glycosylated Hgb(A1C), Blood Lavender; Future    Patient Instructions   Go upstairs to lab for blood test today - check in at the front desk here on the 3rd floor first - we will contact you with results    Referral to Psychiatry for ADHD testing - call to schedule appointment  Referral to Ortho foot clinic - call to schedule appointment  Xray of foot today    Try to stop drinking coffee after 3pm - this will probably help with both anxiety and insomnia  Try doing some "mini-meditations" on the Headspace app  Continue regular exercise  Try to eat at least 5 servings of vegetables and fruits a day    It was nice to meet you today!      Follow-up as scheduled  Barriers to learning assessed: None.  Patient verbalizes understanding and is agreeable to above plan

## 2016-10-17 NOTE — Patient Instructions (Addendum)
Go upstairs to lab for blood test today - check in at the front desk here on the 3rd floor first - we will contact you with results    Referral to Psychiatry for ADHD testing - call to schedule appointment  Referral to Ortho foot clinic - call to schedule appointment  Xray of foot today    Try to stop drinking coffee after 3pm - this will probably help with both anxiety and insomnia  Try doing some "mini-meditations" on the Headspace app  Continue regular exercise  Try to eat at least 5 servings of vegetables and fruits a day    It was nice to meet you today!

## 2016-10-31 ENCOUNTER — Encounter (INDEPENDENT_AMBULATORY_CARE_PROVIDER_SITE_OTHER): Payer: BC Managed Care – PPO | Admitting: Psychiatry

## 2016-11-05 ENCOUNTER — Encounter (INDEPENDENT_AMBULATORY_CARE_PROVIDER_SITE_OTHER): Payer: Self-pay | Admitting: Internal Medicine

## 2016-11-05 ENCOUNTER — Other Ambulatory Visit (INDEPENDENT_AMBULATORY_CARE_PROVIDER_SITE_OTHER): Payer: Self-pay | Admitting: Internal Medicine

## 2016-11-05 ENCOUNTER — Telehealth (INDEPENDENT_AMBULATORY_CARE_PROVIDER_SITE_OTHER): Payer: Self-pay | Admitting: Internal Medicine

## 2016-11-05 ENCOUNTER — Inpatient Hospital Stay (INDEPENDENT_AMBULATORY_CARE_PROVIDER_SITE_OTHER)
Admit: 2016-11-05 | Discharge: 2016-11-05 | Disposition: A | Discharge status: Home Health | Payer: BC Managed Care – PPO | Attending: Internal Medicine | Admitting: Internal Medicine

## 2016-11-05 DIAGNOSIS — S92351D Displaced fracture of fifth metatarsal bone, right foot, subsequent encounter for fracture with routine healing: Secondary | ICD-10-CM

## 2016-11-05 DIAGNOSIS — S92901S Unspecified fracture of right foot, sequela: Principal | ICD-10-CM

## 2016-11-05 NOTE — Telephone Encounter (Signed)
MyChart message sent to patient with xray results and reminder to schedule with Orthopedics.

## 2016-11-07 NOTE — Telephone Encounter (Signed)
From: Fuller Planhristopher Wong  To: Normand SloopSierra, Julie Rose, MD  Sent: 11/05/2016 7:26 PM PDT  Subject: 1-Non Urgent Medical Advice    Dr. Jackquline DenmarkJulie Rose,    If I need to have another x-ray for my left shoulder and my left foot, do I need to schedule an appointment to get that ordered?       Thank you!    -Cristal Deerhristopher

## 2016-11-08 NOTE — Telephone Encounter (Signed)
I have only seen this patient once, on 10/17/16, and we addressed multiple issues, but not his L foot or L shoulder.  I need to see him in clinic to assess him before ordering xrays.

## 2016-11-12 NOTE — Progress Notes (Signed)
Office notes and labs faxed to Dr Collene LeydenKrista Edelman at [phone]

## 2016-11-12 NOTE — Telephone Encounter (Signed)
Waiting on pt to respond in regards to if he had  symptoms. MD did state for new x ray requests for left shoulder/ foot. MD advised to  Make appt.     Pls help schedule appt for pt

## 2016-11-15 ENCOUNTER — Encounter (HOSPITAL_BASED_OUTPATIENT_CLINIC_OR_DEPARTMENT_OTHER): Payer: Self-pay | Admitting: Foot & Ankle Surgery

## 2016-11-15 ENCOUNTER — Ambulatory Visit: Payer: BC Managed Care – PPO | Attending: Foot & Ankle Surgery | Admitting: Foot & Ankle Surgery

## 2016-11-15 VITALS — BP 121/76 | HR 67 | Temp 97.2°F | Ht 75.0 in | Wt 215.0 lb

## 2016-11-15 DIAGNOSIS — S92354K Nondisplaced fracture of fifth metatarsal bone, right foot, subsequent encounter for fracture with nonunion: Secondary | ICD-10-CM | POA: Insufficient documentation

## 2016-11-15 DIAGNOSIS — G8929 Other chronic pain: Secondary | ICD-10-CM | POA: Insufficient documentation

## 2016-11-15 DIAGNOSIS — M79671 Pain in right foot: Principal | ICD-10-CM | POA: Insufficient documentation

## 2016-11-15 NOTE — Telephone Encounter (Signed)
Called patient to schedule f/u visit with Dr. Raoul Pitch, left VM requesting a call back to schedule. If patient calls back please assist with scheduling next available return visit, thank you.

## 2016-11-15 NOTE — Progress Notes (Signed)
Foot and ankle orthopedics   Podiatry consultation     Referring Physician: Self, Referred  No address on file    Primary Care Physician: Normand Sloop    CC:  Right foot pain      History of Present Illness:   Joshua Salas is a 31 year old male here for evaluation of right foot symptoms that developed approximately 3 years ago when he fractured his 5th metatarsal in college.  Reports he really had no formal treatment and was running on the foot about 2 months following when he feels he may have   reinjured it.      Since then, he continues to experience pain at the 5th metatarsal proximal shaft/base area with any high impact activity.  Feels if he lands on the foot wrong (mostly in a inversion position) he will have "crippling" pain of the right foot.  Also reports associated edema of the area.  No associated numbness.     Currently asymptomatic and doesn't generally experience pain with day to day walking.  His symptoms occur with activity only and really limit how active he would like to be.        Past Medical History:  The patient has a past medical history of Anxiety; Deviated septum; and Foot fracture, right.    Past Surgical History:  The patient has no past surgical history on file.    No Known Allergies    No current outpatient prescriptions on file.     No current facility-administered medications for this visit.        Social History:  The patient reports that he has never smoked. He has never used smokeless tobacco. He reports that he drinks alcohol. He reports that he does not use illicit drugs.    Family History:   The patient's family history includes ADHD in his brother, father, and sister.       Review of Systems:  As noted above in the present and past history sections. In addition, the following systems are reviewed with findings as noted below.  CONSTITUTIONAL: No fevers, chills or weight loss.  HEENT: No complaints of changes in vision, hearing, speech, or swallowing.      CARDIOVASCULAR: No complaints of chest pain or shortness of breath.   PULMONARY: No current complaints of cough or hemoptysis.   GASTROINTESTINAL: No complaints of nausea, vomiting, diarrhea, or constipation.   GENITOURINARY: No complaints of dysuria or hematuria.   NEUROLOGIC: No focal weakness, dizziness, seizures, or headaches.  ENDOCRINOLOGIC: No history of diabetes.  HEMATOLOGIC: No problems with easy bruising or bleeding.    Physical Examination (lower extremity):  GENERAL: The patient is seated, in no distress, awake, alert, and cooperative.  VITALS: Temperature:  [97.2 F (36.2 C)] 97.2 F (36.2 C) (09/20 1412)  Blood pressure (BP): (121)/(76) 121/76 (09/20 1412)  Heart Rate:  [67] 67 (09/20 1412)  Pain Score: 2 (09/20 1412)  NEUROLOGIC: Sensation testing is intact to light touch in the bilateral lower extremities based on gross testing.  Sensation is intact in the superficial peroneal, deep peroneal, and tibial nerve distributions.    VASCULAR: Dorsalis pedis pulses are palpable, 2/4 bilateral.   Posterior tibialis pulses are palpable, 2/4 bilateral.   There is no edema bilateral feet or legs.   Both feet feel equal in temperature.  The foot is well perfused.   Hair growth on toes is normal.  Skin is dry.    Subcapillary venous plexus filling time is WNL.  SKIN: No skin lesions noted of feet or ankles.    MUSCULOSKELETAL:   Right LE exam (Non-Wt Bearing Assessment):   Right ankle DF 10 degrees               PF 40 degrees  Right STJ inversion 20 degrees        Eversion 10 degrees  Right 1st MTPJ DF 65 degrees        PF 30 degrees  Strength in the right lower extremity is intact, no focal weakness.     Left LE exam (Non-Wt Bearing Assessment):   Left ankle DF 10 degrees            PF 40 degrees  Left STJ inversion 20 degrees     Eversion 10 degrees  Left 1st MTPJ DF 65 degrees      PF 30 degrees  Strength in the left lower extremities is intact, no focal weakness.       Motor Function:   Right EHL: 5/5  TA: 5/5 Per: 5/5 Gastroc: 5/5 PT: 5/5    Left EHL: 5/5 TA: 5/5 Per: 5/5 Gastroc: 5/5 PT: 5/5           Imaging:  I have personally reviewed all imaging of the right foot 11/15/16:     IMPRESSION:  Partially healed 5th metatarsal fracture involving the proximal shaft just distal to the level of the 4th and 5th intermetatarsal articulation. Questionable subtle sclerosis of the 4th proximal metatarsal shaft may reflect remote injury or healing fracture.    Os trigonum. Small amount of osseous proliferation at the proximodorsal aspect of the navicular, may reflect remote injury. Hypoplastic appearing medial hallux sesamoid.    A/P:     ICD-10-CM ICD-9-CM    1. Right foot pain M79.671 729.5 MRI Lower Extremity Non Joint W/O Contrast - Right   2. 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 Chronic pain in right foot M79.671 729.5     G89.29 338.29        X-rays reviewed with the patient.  All questions answered.   Findings and Salas reviewed with the patient.    Will get MRI of the right foot for further evaluation.   Discussed referral to surgeon to discuss surgical options.  Will wait MRI findings.      I spent 30 minutes with this patient. More than half of the scheduled clinic appointment time was spent face-to-face with the patient to provide education, counseling and coordination of care.   Diagnosis and treatment options were reviewed and Joshua Salas verbalized/demonstrated understanding of teaching and instructions, and is satisfied with Salas.     F/U after MRI       Derrell Lolling, DPM

## 2016-11-20 ENCOUNTER — Encounter (INDEPENDENT_AMBULATORY_CARE_PROVIDER_SITE_OTHER): Payer: BC Managed Care – PPO | Admitting: Psychiatry

## 2016-11-20 ENCOUNTER — Telehealth (INDEPENDENT_AMBULATORY_CARE_PROVIDER_SITE_OTHER): Payer: Self-pay | Admitting: Psychiatry

## 2016-11-20 NOTE — Telephone Encounter (Signed)
The pt cancelled his appt scheduled for today 9/25 @ 9am.    Joshua Salas  [facility]

## 2016-11-27 ENCOUNTER — Encounter (INDEPENDENT_AMBULATORY_CARE_PROVIDER_SITE_OTHER): Payer: Self-pay | Admitting: Internal Medicine

## 2016-11-27 NOTE — Telephone Encounter (Signed)
Called patient to schedule office visit with Dr. Raoul Pitch. Left VM requesting a call back, if patient calls back please assist with scheduling. Letter being sent to patient.

## 2016-11-30 ENCOUNTER — Ambulatory Visit (HOSPITAL_BASED_OUTPATIENT_CLINIC_OR_DEPARTMENT_OTHER): Payer: BC Managed Care – PPO

## 2016-12-03 ENCOUNTER — Encounter (INDEPENDENT_AMBULATORY_CARE_PROVIDER_SITE_OTHER): Payer: BC Managed Care – PPO | Admitting: Psychiatry

## 2016-12-04 NOTE — Progress Notes (Signed)
Acute Visit Progress Note      Chief Complaint:      Pharyngitis and Shoulder Pain, Left      History:      I did review patient's past medical history.    Joshua Salas is a 31 year old male with ADHD who presents for sore throat x 3 days and chronic left shoulder pain.      3 days ago patient developed sore throat, fatigue, headache.  no cough, congestion.  No N/V, diarrhea.  No fevers.  No rashes.    Girlfriend feeling not well too with similar symptoms.        Left shoulder pain:  Reports that 6-7 years ago that he slept sideways one day and felt sharp pain.  Shoulder "cracks".  No major trauma or contact sports or falls.  Able to Weight lift 145lbs over his head.  No numbness or weakness in arms or hands. Pain is very intermittent and related to such activities as weight lifting.  Pain described as sharp.  Just thought he should mention since he has not done anything about it for past 6-7 years.        Patient Active Problem List   Diagnosis   . Attention deficit hyperactivity disorder (ADHD), unspecified ADHD type   . Closed fracture of right foot, sequela   . Anxiety   . Deviated septum     No current outpatient prescriptions on file.     No current facility-administered medications for this visit.        Allergies: Review of patient's allergies indicates no known allergies.    Additional ROS:     Constitutional:denies fever, chills, night sweats, +fatigue  EYES: denies visual change, eye pain  ENT: denies oral lesions, sinus congestion or pain +mild sore throat  HEART/LUNGS: denies shortness of breath, dyspnea on exertion, cough, chest pain, pedal edema  GI: denies nausea, vomiting, diarrhea, abdominal pain  Neuro: denies new focal neurologic deficits, dizziness, syncope  Musculoskeletal: +left shoulder pain  Derm: denies new skin rashes   Psych: denies recent changes in mood, sleep or stress level    Objective:      Blood pressure 102/82, pulse 71, temperature 98.3 F (36.8 C), temperature source  Temporal Artery, weight 101.6 kg (224 lb), SpO2 98 %.Body mass index is 28 kg/(m^2).    General Appearance: healthy, alert, no distress, pleasant affect, cooperative.  Ears:  normal TMs and canal.  Mouth: mild erythema.  Neck:  Neck supple. No adenopathy.  Heart:  normal rate and regular rhythm, no murmurs, clicks, or gallops.  Lungs: clear to auscultation, no chest deformities noted.  Musculoskeletal: left shoulder: no swelling, tenderness, FROM shoulder joint, +pain but no weakness with empty can test.  +crepitus at sternoclavicular and [facility] joint       Assessment/Plan:      Joshua Salas is a 31 year old male with probable viral illness.   Symptomatic relief.     Rest/fluids   Ibuprofen  po every 8 hours as needed (take with food)    Chronic left shoulder pain- rotator cuff tendinopathy     Consult to [facility] - [facility]; Future        Barriers to Learning assessed: none. Patient verbalizes understanding of teaching and instructions.    Garry Heater, MD  Attending Physician

## 2016-12-05 ENCOUNTER — Encounter (INDEPENDENT_AMBULATORY_CARE_PROVIDER_SITE_OTHER): Payer: Self-pay | Admitting: Internal Medicine

## 2016-12-05 ENCOUNTER — Ambulatory Visit (INDEPENDENT_AMBULATORY_CARE_PROVIDER_SITE_OTHER): Payer: BC Managed Care – PPO | Admitting: Internal Medicine

## 2016-12-05 VITALS — BP 102/82 | HR 71 | Temp 98.3°F | Wt 224.0 lb

## 2016-12-05 DIAGNOSIS — M25512 Pain in left shoulder: Secondary | ICD-10-CM

## 2016-12-05 DIAGNOSIS — B349 Viral infection, unspecified: Principal | ICD-10-CM

## 2016-12-05 DIAGNOSIS — G8929 Other chronic pain: Secondary | ICD-10-CM

## 2016-12-05 NOTE — Patient Instructions (Signed)
You likely have a viral illness.  Get lots of rest and drink lots of fluids.  ibuprofen  every 8 hours as needed    Physical Therapy referral.    It was a pleasure meeting you today.  Please do not hestitate to contact the clinic for an earlier appointment if you are not feeling like our current treatment plan is working. You can also call the clinic if you have a new problem that develops before the next appointment.     My name is Dr. Wyvonnia Lora. The office telephone number is [phone]. If you need assistance after business hours, please call [phone].

## 2016-12-11 DIAGNOSIS — S61011A Laceration without foreign body of right thumb without damage to nail, initial encounter: Secondary | ICD-10-CM

## 2016-12-11 NOTE — ED Triage Notes (Addendum)
Pt to ED with lac to right thumb. Pt was opening canned food and sliced thumb on lid. Bleeding controlled with gauze bandage. Unknown when last tetanus was given.

## 2016-12-12 ENCOUNTER — Inpatient Hospital Stay
Admit: 2016-12-12 | Discharge: 2016-12-12 | Disposition: A | Discharge status: Home / Self Care | Payer: MEDICAID | Attending: Emergency Medicine

## 2016-12-12 MED ORDER — LIDOCAINE HCL 2 % (20 MG/ML) IJ SOLN
20 mg/mL (2 %) | Freq: Once | INTRAMUSCULAR | Status: DC
Start: 2016-12-12 — End: 2016-12-12

## 2016-12-12 MED ORDER — DIPHTH,PERTUS(AC)TETANUS VAC(PF) 2.5 LF UNIT-8 MCG-5 LF/0.5 ML INJ
INTRAMUSCULAR | Status: AC
Start: 2016-12-12 — End: 2016-12-12
  Administered 2016-12-12: 05:00:00 via INTRAMUSCULAR

## 2016-12-12 MED FILL — XYLOCAINE 20 MG/ML (2 %) INJECTION SOLUTION: 20 mg/mL (2 %) | INTRAMUSCULAR | Qty: 10

## 2016-12-12 MED FILL — BOOSTRIX TDAP 2.5 LF UNIT-8 MCG-5 LF/0.5 ML INTRAMUSCULAR SYRINGE: INTRAMUSCULAR | Qty: 0.5

## 2016-12-12 NOTE — ED Provider Notes (Signed)
HPI Comments: Cut rt thumb on can of spaghetti O s about 1030 PM tonight, Rt hand dominant. Tetaus immunization staus unknown. FROM. Distal sensation intact. No other acute medical complaints. Demaya Hardge C [place], [place]      Patient is a 31 y.o. male presenting with skin laceration. The history is provided by the patient.   Laceration    The incident occurred less than 1 hour ago. Pain location: rt thumb. The laceration is 4 cm in size. Injury mechanism: can edge. Foreign body present: no. The pain is at a severity of 3/10. The pain is mild. The pain has been constant since onset. Pertinent negatives include no numbness, no tingling, no weakness, no loss of motion and no coolness. It is unknown when the patient last had a tetanus shot.        Past Medical History:   Diagnosis Date   ??? Asthma    ??? Chest pain    ??? Cough    ??? EP (epilepsy) (HCC)    ??? Kidney stone    ??? Migraine    ??? Muscle pain    ??? Seizures (HCC)    ??? Skipped beats    ??? SOB (shortness of breath)    ??? Vitamin D deficiency 08/14/2016       History reviewed. No pertinent surgical history.      Family History:   Problem Relation Age of Onset   ??? Cancer Mother      stomach   ??? Migraines Mother    ??? Seizures Mother    ??? No Known Problems Father        Social History     Social History   ??? Marital status: SINGLE     Spouse name: N/A   ??? Number of children: N/A   ??? Years of education: N/A     Occupational History   ??? Not on file.     Social History Main Topics   ??? Smoking status: Former Smoker     Packs/day: 0.25   ??? Smokeless tobacco: Never Used      Comment: approx 2-3   ??? Alcohol use Yes      Comment: Occassionally   ??? Drug use: No   ??? Sexual activity: Yes     Partners: Male     Birth control/ protection: None     Other Topics Concern   ??? Not on file     Social History Narrative    On disability         ALLERGIES: Review of patient's allergies indicates no known allergies.    Review of Systems   Constitutional: Negative.  Negative for chills and fever.    HENT: Negative for congestion, ear pain, rhinorrhea, sore throat and voice change.    Eyes: Negative.    Respiratory: Negative for cough, chest tightness and shortness of breath.    Cardiovascular: Negative for chest pain and palpitations.   Gastrointestinal: Negative for abdominal pain, constipation, diarrhea and vomiting.   Genitourinary: Negative for dysuria, frequency and urgency.   Musculoskeletal: Positive for arthralgias.   Skin: Positive for wound (rt thumb).   Neurological: Negative for tingling, weakness, numbness and headaches.   All other systems reviewed and are negative.      Vitals:    12/11/16 2322   BP: 102/58   Pulse: 88   Resp: 16   Temp: 98.2 ??F (36.8 ??C)   SpO2: 96%   Weight: 94.4 kg (208 lb 1.8 oz)  Height: 6\' 6"  (1.981 m)            Physical Exam   Constitutional: He is oriented to person, place, and time. He appears well-developed and well-nourished. No distress.   Pleasant AAM in NAD   HENT:   Head: Normocephalic and atraumatic.   Right Ear: External ear normal.   Left Ear: External ear normal.   Nose: Nose normal.   Mouth/Throat: No oropharyngeal exudate.   Eyes: Conjunctivae are normal.   Cardiovascular: Normal rate, regular rhythm and normal heart sounds.    Pulmonary/Chest: Effort normal and breath sounds normal. He has no wheezes. He has no rales.   Abdominal: He exhibits no distension. There is no tenderness. There is no guarding.   Musculoskeletal: Normal range of motion.        Hands:  Lymphadenopathy:     He has no cervical adenopathy.   Neurological: He is alert and oriented to person, place, and time. No cranial nerve deficit.   Skin: Skin is warm and dry. He is not diaphoretic.   Psychiatric: He has a normal mood and affect. His behavior is normal.   Nursing note and vitals reviewed.       MDM  Number of Diagnoses or Management Options  Laceration of hand, foreign body presence unspecified, unspecified laterality, initial encounter:    Diagnosis management comments: 31 yo AAM with laceration to rt thumb. N/V intact  Plan  Clean and repair wound.  Boostrix. Teesha Ohm C [place], [place]          ED Course       Wound Repair  Date/Time: 12/12/2016 1:00 AM  Performed by: PASupervising provider: Dr. Mindi Slicker  Preparation: skin prepped with Betadine  Location: right thumb.  Wound length:2.6 - 7.5 cm  Anesthesia: digital block    Anesthesia:  Local Anesthetic: lidocaine 2% without epinephrine  Anesthetic total: 5 mL  Foreign bodies: no foreign bodies  Irrigation solution: saline  Irrigation method: jet lavage  Debridement: none  Skin closure: 4-0 nylon  Number of sutures: 5  Technique: simple and interrupted  Approximation: close  Dressing: antibiotic ointment and gauze roll  Patient tolerance: Patient tolerated the procedure well with no immediate complications  My total time at bedside, performing this procedure was 1-15 minutes.    Patient's results have been reviewed with them.  Patient and/or family have verbally conveyed their understanding and agreement of the patient's signs, symptoms, diagnosis, treatment and prognosis and additionally agree to follow up as recommended or return to the Emergency Room should their condition change prior to follow-up.  Discharge instructions have also been provided to the patient with some educational information regarding their diagnosis as well a list of reasons why they would want to return to the ER prior to their follow-up appointment should their condition change. Vanassa Penniman C [place], [place]    A/P  Finger laceration: APPLY ICE FOR PAIN/SWELLING.    APPLY ANTIBIOTIC OINTMENT 2-3 X DAY.     REMOVAL IN 10 DAYS.     FOLLOW UP IF ANY REDNESS/SWELLING/DRAINAGE OR SIGNS OF INFECTION.

## 2016-12-12 NOTE — ED Notes (Signed)
Assumed care of patient at this time. He was opening a can and cut himself on the inside of the tin. Laceration to the inside of the LEFT thumb.

## 2016-12-12 NOTE — ED Notes (Signed)
Patient medically cleared for discharge at this time. He was given all discharge instructions and education. Seen leaving the department with a steady gait and all belongings.

## 2016-12-12 NOTE — ED Notes (Signed)
The documentation for this period is being entered following the guidelines as defined in the [facility] downtime policy by Jennifer R Roberts, RN.

## 2016-12-18 ENCOUNTER — Telehealth (HOSPITAL_BASED_OUTPATIENT_CLINIC_OR_DEPARTMENT_OTHER): Payer: Self-pay

## 2016-12-18 NOTE — Telephone Encounter (Signed)
I have attempted to reach the patient and schedule a therapy appointment. I was able to leave a message and provide our main line [phone], so the patient may call us back to schedule at their earliest convenience.

## 2016-12-26 ENCOUNTER — Encounter (INDEPENDENT_AMBULATORY_CARE_PROVIDER_SITE_OTHER): Payer: Self-pay | Admitting: Internal Medicine

## 2016-12-26 DIAGNOSIS — Z0000 Encounter for general adult medical examination without abnormal findings: Principal | ICD-10-CM

## 2019-03-13 ENCOUNTER — Encounter: Payer: Self-pay | Admitting: Hospital

## 2019-05-16 ENCOUNTER — Ambulatory Visit (INDEPENDENT_AMBULATORY_CARE_PROVIDER_SITE_OTHER): Payer: BC Managed Care – PPO

## 2019-05-27 ENCOUNTER — Encounter (INDEPENDENT_AMBULATORY_CARE_PROVIDER_SITE_OTHER): Payer: Self-pay

## 2019-06-15 DIAGNOSIS — U071 COVID-19: Secondary | ICD-10-CM

## 2019-06-15 NOTE — ED Notes (Signed)
Patient able to ambulate around the room and his oxygen stayed at 100% on room air. Pt in no acute distress at this time.

## 2019-06-15 NOTE — ED Provider Notes (Signed)
ED Provider Notes by Royanne Foots, MD at 06/15/19 2154                Author: Royanne Foots, MD  Service: EMERGENCY  Author Type: Physician       Filed: 06/15/19 2303  Date of Service: 06/15/19 2154  Status: Signed          Editor: Royanne Foots, MD (Physician)               EMERGENCY DEPARTMENT HISTORY AND PHYSICAL EXAM           Date: 06/15/2019   Patient Name: Connor Prince   Patient Age and Sex: 34 y.o.  male         History of Presenting Illness          Chief Complaint       Patient presents with        ?  Positive For Covid-19           History Provided By: Patient      HPI: Hancel Ion is a  33 year old male with a history of asthma presenting with shortness of breath and chest pain.  Patient states that for the past couple of days has been having some shortness of breath as well as cold symptoms.  Went to [facility] and was diagnosed with COVID-19  infection.  Was also diagnosed with pneumonia on the right side of his chest.  Since yesterday has been having right-sided chest pain as well as shortness of breath and feeling fatigued with myalgias.  Was prescribed amoxicillin and has taken 1 dose of  the amoxicillin so far.  Not on any steroids.  Patient has been feeling febrile but has not taken anything for it.  EMS states that patient's vitals in route was normal except for some tachycardia.  His saturations were normal.      There are no other complaints, changes, or physical findings at this time.      PCP: Romeo Rabon, NP        No current facility-administered medications on file prior to encounter.           Current Outpatient Medications on File Prior to Encounter          Medication  Sig  Dispense  Refill           ?  divalproex DR (DEPAKOTE) 500 mg tablet  Take 2,000 mg by mouth daily.                 Past History        Past Medical History:     Past Medical History:        Diagnosis  Date         ?  Asthma       ?  Chest pain       ?  Cough       ?  EP (epilepsy) (HCC)        ?  Kidney stone       ?  Migraine       ?  Muscle pain       ?  Seizures (HCC)       ?  Skipped beats       ?  SOB (shortness of breath)           ?  Vitamin D deficiency  08/14/2016           Past Surgical History:  No past surgical history on file.      Family History:     Family History         Problem  Relation  Age of Onset          ?  Cancer  Mother                stomach          ?  Migraines  Mother       ?  Seizures  Mother            ?  No Known Problems  Father             Social History:     Social History          Tobacco Use         ?  Smoking status:  Former Smoker              Packs/day:  0.25         ?  Smokeless tobacco:  Never Used        ?  Tobacco comment: approx 2-3       Substance Use Topics         ?  Alcohol use:  Yes             Comment: Occassionally         ?  Drug use:  No           Allergies:   No Known Allergies           Review of Systems     Review of Systems    Constitutional: Positive for chills, fatigue  and fever.    HENT: Positive for congestion.     Respiratory: Positive for cough and shortness of breath .     Cardiovascular: Positive for chest pain.    Gastrointestinal: Negative for abdominal pain, constipation, diarrhea, nausea and vomiting.    Genitourinary: Negative for dysuria, frequency and hematuria.    Musculoskeletal: Positive for myalgias.    Neurological: Positive for headaches. Negative for weakness and numbness.    All other systems reviewed and are negative.            Physical Exam     Physical Exam   Vitals signs and nursing note reviewed.   Constitutional:        Appearance: He is well-developed.      Comments: Patient appears tired   HENT:       Head: Normocephalic and atraumatic.      Nose: Congestion present.      Mouth/Throat:      Mouth: Mucous membranes are moist.   Eyes:       Extraocular Movements: Extraocular movements intact.      Conjunctiva/sclera: Conjunctivae normal.    Neck:       Musculoskeletal: Normal range of motion and neck supple.    Cardiovascular :       Rate and Rhythm: Regular rhythm. Tachycardia present.   Pulmonary:       Effort: Pulmonary effort is normal. No respiratory distress.      Breath sounds: Normal breath sounds.      Comments:  Patient is saturating at 100% on the monitor and answering questions appropriately.  Lungs are clear.  Abdominal :      General: There is no distension.      Palpations: Abdomen is soft.      Tenderness: There  is no abdominal tenderness.     Musculoskeletal: Normal range of motion.    Skin:      General: Skin is warm and dry.   Neurological :       General: No focal deficit present.      Mental Status: He is alert and oriented to person, place, and time. Mental status is at baseline.   Psychiatric:         Mood and Affect: Mood normal.                Diagnostic Study Results        Labs -    No results found for this or any previous visit (from the past 12 hour(s)).      Radiologic Studies -      No orders to display          CT Results   (Last 48 hours)          None                 CXR Results   (Last 48 hours)          None                       Medical Decision Making     I am the first provider for this patient.      I reviewed the vital signs, available nursing notes, past medical history, past surgical history, family history and social history.      Vital Signs-Reviewed the patient's vital signs.   Patient Vitals for the past 12 hrs:            Temp  Pulse  Resp  BP  SpO2            06/15/19 2236  (!) 100.5 ??F (38.1 ??C)  (!) 107  --  --  --            06/15/19 2200  --  --  --  135/73  --            06/15/19 2152  (!) 100.6 ??F (38.1 ??C)  (!) 107  18  136/69  100 %           Records Reviewed: Nursing Notes and Old Medical Records      Provider Notes (Medical Decision Making):    Patient with recent diagnosis of COVID-19 infection and viral pneumonia just prescribed Amoxil presenting with persistent shortness of breath and chest pain.  Most likely continuation of the Covid pneumonia  that has not  had very much time for the antibiotics to work.  Patient is febrile here at 100.6 we will give him Tylenol as well as steroids and ambulating around the room to make sure he does not desaturate.  Will review [facility] records and chest x-ray results  to make sure we do not need to repeat it.  He states that the pneumonia was diagnosed on the right side where his pain is.      ED Course:    Initial assessment performed. The patients presenting problems have been discussed, and they are in agreement with the care plan formulated and outlined with them.  I have encouraged them to ask questions as they arise throughout their visit.          Critical Care Time:    0      Disposition:   Discharge Note:   The patient has  been re-evaluated and is ready for discharge. Reviewed available results with patient. Counseled patient on diagnosis and care plan. Patient has expressed understanding, and all questions  have been answered. Patient agrees with plan and agrees to follow up as recommended, or to return to the ED if their symptoms worsen. Discharge instructions have been provided and explained to the patient, along with reasons to return to the ED.        PLAN:     Current Discharge Medication List              START taking these medications          Details        dexAMETHasone (DECADRON) 1 mg tablet  Take 1 Tab by mouth two (2) times daily (with meals) for 3 days.   Qty: 6 Tab, Refills:  0                      2.      Follow-up Information               Follow up With  Specialties  Details  Why  Contact Info              Romeo Rabon, NP  Nurse Practitioner  Schedule an appointment as soon as possible for a visit     [address] [place] [address]   [phone]                3.  Return to ED if worse         Diagnosis        Clinical Impression:       1.  Pneumonia due to COVID-19 virus            Attestations:      Royanne Foots, M.D.              Please note that this dictation was completed with Dragon,  the computer voice recognition software.  Quite often unanticipated grammatical, syntax, homophones, and other interpretive errors are inadvertently transcribed by the computer software.  Please  disregard these errors.  Please excuse any errors that have escaped final proofreading.  Thank you.

## 2019-06-15 NOTE — ED Notes (Signed)
..  Discharge summary and discharge medications reviewed with patient and appropriate educational materials and side effects teaching were provided.  patient  Given 0 paper prescriptions and 1 electronic prescriptions sent to pt's listed pharmacy.Patient  verbalized understanding of the importance of discussing medications with his or her physician or clinic they will be following up with. No si/s of acute distress prior to discharge.Patient offered wheelchair from treatment area to hospital entrance, patient declined wheelchair.    Patient unable to find a right home. Placing a medicaid cap ride in for patient per charge nurse pt is to stay in his room until the cab arrives to limit exposure of covid.

## 2019-06-15 NOTE — ED Notes (Signed)
 Patient presents to the ED with c/o being tested positive for COVID yesterday at [facility] as well as pneumonia. Pt reports having a headache that started on Saturday and stated to have a dry cough, SOB, fevers and right sided chest pain. Reports taking tylenol and amoxicillin; last dose of tylenol was 3 PM. reports diarrhea, denies N/V.       Pt Is alert, oriented and appropriate. Ambulatory on arrival. Lung sounds clear bilaterally.       Emergency Department Nursing Plan of Care       The Nursing Plan of Care is developed from the Nursing assessment and Emergency Department Attending provider initial evaluation.  The plan of care may be reviewed in the "ED Provider note".    The Plan of Care was developed with the following considerations:   Patient / Family readiness to learn indicated ab:czmajopszi understanding  Persons(s) to be included in education: patient  Barriers to Learning/Limitations:No    Signed     Larraine JINNY Bathe    06/15/2019   9:56 PM

## 2019-06-15 NOTE — ED Notes (Signed)
Patient able to ambulate around the room and his oxygen stayed at 100% on room air. Pt in no acute distress at this time.

## 2019-06-15 NOTE — ED Triage Notes (Signed)
Patient presents to the ED with c/o being tested positive for COVID yesterday at [facility] as well as pneumonia. Pt reports having a headache that started on Saturday and stated to have a dry cough, SOB, fevers and right sided chest pain. Reports taking tylenol and amoxicillin; last dose of tylenol was 3 PM. reports diarrhea, denies N/V.       Pt Is alert, oriented and appropriate. Ambulatory on arrival. Lung sounds clear bilaterally.       Emergency Department Nursing Plan of Care       The Nursing Plan of Care is developed from the Nursing assessment and Emergency Department Attending provider initial evaluation.  The plan of care may be reviewed in the ???ED Provider note???.    The Plan of Care was developed with the following considerations:   Patient / Family readiness to learn indicated by:verbalized understanding  Persons(s) to be included in education: patient  Barriers to Learning/Limitations:No    Signed     Kelsey J Reid    06/15/2019   9:56 PM

## 2019-06-15 NOTE — ED Provider Notes (Signed)
EMERGENCY DEPARTMENT HISTORY AND PHYSICAL EXAM      Date: 06/15/2019  Patient Name: Connor Prince  Patient Age and Sex: 34 y.o. male     History of Presenting Illness     Chief Complaint   Patient presents with   ??? Positive For Covid-19       History Provided By: Patient    HPI: Sohrab Keelan is a 34 year old male with a history of asthma presenting with shortness of breath and chest pain.  Patient states that for the past couple of days has been having some shortness of breath as well as cold symptoms.  Went to [facility] and was diagnosed with COVID-19 infection.  Was also diagnosed with pneumonia on the right side of his chest.  Since yesterday has been having right-sided chest pain as well as shortness of breath and feeling fatigued with myalgias.  Was prescribed amoxicillin and has taken 1 dose of the amoxicillin so far.  Not on any steroids.  Patient has been feeling febrile but has not taken anything for it.  EMS states that patient's vitals in route was normal except for some tachycardia.  His saturations were normal.    There are no other complaints, changes, or physical findings at this time.    PCP: Durwin Reges, NP    No current facility-administered medications on file prior to encounter.      Current Outpatient Medications on File Prior to Encounter   Medication Sig Dispense Refill   ??? divalproex DR (DEPAKOTE) 500 mg tablet Take 2,000 mg by mouth daily.         Past History     Past Medical History:  Past Medical History:   Diagnosis Date   ??? Asthma    ??? Chest pain    ??? Cough    ??? EP (epilepsy) (Rudy)    ??? Kidney stone    ??? Migraine    ??? Muscle pain    ??? Seizures (Rafael Gonzalez)    ??? Skipped beats    ??? SOB (shortness of breath)    ??? Vitamin D deficiency 08/14/2016       Past Surgical History:  No past surgical history on file.    Family History:  Family History   Problem Relation Age of Onset   ??? Cancer Mother         stomach   ??? Migraines Mother    ??? Seizures Mother    ??? No Known Problems Father         Social History:  Social History     Tobacco Use   ??? Smoking status: Former Smoker     Packs/day: 0.25   ??? Smokeless tobacco: Never Used   ??? Tobacco comment: approx 2-3   Substance Use Topics   ??? Alcohol use: Yes     Comment: Occassionally   ??? Drug use: No       Allergies:  No Known Allergies      Review of Systems   Review of Systems   Constitutional: Positive for chills, fatigue and fever.   HENT: Positive for congestion.    Respiratory: Positive for cough and shortness of breath.    Cardiovascular: Positive for chest pain.   Gastrointestinal: Negative for abdominal pain, constipation, diarrhea, nausea and vomiting.   Genitourinary: Negative for dysuria, frequency and hematuria.   Musculoskeletal: Positive for myalgias.   Neurological: Positive for headaches. Negative for weakness and numbness.   All other systems reviewed and are negative.  Physical Exam   Physical Exam  Vitals signs and nursing note reviewed.   Constitutional:       Appearance: He is well-developed.      Comments: Patient appears tired   HENT:      Head: Normocephalic and atraumatic.      Nose: Congestion present.      Mouth/Throat:      Mouth: Mucous membranes are moist.   Eyes:      Extraocular Movements: Extraocular movements intact.      Conjunctiva/sclera: Conjunctivae normal.   Neck:      Musculoskeletal: Normal range of motion and neck supple.   Cardiovascular:      Rate and Rhythm: Regular rhythm. Tachycardia present.   Pulmonary:      Effort: Pulmonary effort is normal. No respiratory distress.      Breath sounds: Normal breath sounds.      Comments: Patient is saturating at 100% on the monitor and answering questions appropriately.  Lungs are clear.  Abdominal:      General: There is no distension.      Palpations: Abdomen is soft.      Tenderness: There is no abdominal tenderness.   Musculoskeletal: Normal range of motion.   Skin:     General: Skin is warm and dry.   Neurological:      General: No focal deficit present.       Mental Status: He is alert and oriented to person, place, and time. Mental status is at baseline.   Psychiatric:         Mood and Affect: Mood normal.          Diagnostic Study Results     Labs -   No results found for this or any previous visit (from the past 12 hour(s)).    Radiologic Studies -   No orders to display     CT Results  (Last 48 hours)    None        CXR Results  (Last 48 hours)    None            Medical Decision Making   I am the first provider for this patient.    I reviewed the vital signs, available nursing notes, past medical history, past surgical history, family history and social history.    Vital Signs-Reviewed the patient's vital signs.  Patient Vitals for the past 12 hrs:   Temp Pulse Resp BP SpO2   06/15/19 2236 (!) 100.5 ??F (38.1 ??C) (!) 107 ??? ??? ???   06/15/19 2200 ??? ??? ??? 135/73 ???   06/15/19 2152 (!) 100.6 ??F (38.1 ??C) (!) 107 18 136/69 100 %       Records Reviewed: Nursing Notes and Old Medical Records    Provider Notes (Medical Decision Making):   Patient with recent diagnosis of COVID-19 infection and viral pneumonia just prescribed Amoxil presenting with persistent shortness of breath and chest pain.  Most likely continuation of the Covid pneumonia that has not had very much time for the antibiotics to work.  Patient is febrile here at 100.6 we will give him Tylenol as well as steroids and ambulating around the room to make sure he does not desaturate.  Will review [facility] records and chest x-ray results to make sure we do not need to repeat it.  He states that the pneumonia was diagnosed on the right side where his pain is.    ED Course:   Initial assessment performed. The  patients presenting problems have been discussed, and they are in agreement with the care plan formulated and outlined with them.  I have encouraged them to ask questions as they arise throughout their visit.       Critical Care Time:   0    Disposition:  Discharge Note:  The patient has been re-evaluated and is ready  for discharge. Reviewed available results with patient. Counseled patient on diagnosis and care plan. Patient has expressed understanding, and all questions have been answered. Patient agrees with plan and agrees to follow up as recommended, or to return to the ED if their symptoms worsen. Discharge instructions have been provided and explained to the patient, along with reasons to return to the ED.      PLAN:  Current Discharge Medication List      START taking these medications    Details   dexAMETHasone (DECADRON) 1 mg tablet Take 1 Tab by mouth two (2) times daily (with meals) for 3 days.  Qty: 6 Tab, Refills: 0           2.   Follow-up Information     Follow up With Specialties Details Why Contact Info    Romeo Rabon, NP Nurse Practitioner Schedule an appointment as soon as possible for a visit   [address] [place] [address]  [phone]          3.  Return to ED if worse     Diagnosis     Clinical Impression:   1. Pneumonia due to COVID-19 virus        Attestations:    Royanne Foots, M.D.        Please note that this dictation was completed with Dragon, the computer voice recognition software.  Quite often unanticipated grammatical, syntax, homophones, and other interpretive errors are inadvertently transcribed by the computer software.  Please disregard these errors.  Please excuse any errors that have escaped final proofreading.  Thank you.

## 2019-06-16 ENCOUNTER — Inpatient Hospital Stay
Admit: 2019-06-16 | Discharge: 2019-06-16 | Disposition: A | Discharge status: Home / Self Care | Payer: MEDICAID | Attending: Emergency Medicine

## 2019-06-16 MED ORDER — DEXAMETHASONE 1 MG TAB
1 mg | ORAL_TABLET | Freq: Two times a day (BID) | ORAL | 0 refills | Status: AC
Start: 2019-06-16 — End: 2019-06-18

## 2019-06-16 MED ORDER — ACETAMINOPHEN 500 MG TAB
500 mg | Freq: Once | ORAL | Status: AC
Start: 2019-06-16 — End: 2019-06-15
  Administered 2019-06-16: 02:00:00 via ORAL

## 2019-06-16 MED ORDER — DEXAMETHASONE SODIUM PHOSPHATE 10 MG/ML IJ SOLN
10 mg/mL | INTRAMUSCULAR | Status: AC
Start: 2019-06-16 — End: 2019-06-15
  Administered 2019-06-16: 02:00:00 via INTRAMUSCULAR

## 2019-06-16 MED FILL — DEXAMETHASONE SODIUM PHOSPHATE 10 MG/ML IJ SOLN: 10 mg/mL | INTRAMUSCULAR | Qty: 1

## 2019-06-16 MED FILL — ACETAMINOPHEN 500 MG TAB: 500 mg | ORAL | Qty: 2

## 2019-06-17 NOTE — ACP (Advance Care Planning) (Signed)
decision maker updated

## 2019-06-17 NOTE — Progress Notes (Signed)
Patient contacted regarding COVID-19 diagnosis". Discussed COVID-19 related testing which was not done at this time. Test results were not done. Patient informed of results, if available? yes     LPN Care Coordinator contacted the patient by telephone to perform post discharge assessment. Call within 2 business days of discharge: Yes Verified name and DOB with patient as identifiers. Provided introduction to self, and explanation of the CTN/ACM role, and reason for call due to risk factors for infection and/or exposure to COVID-19.     Symptoms reviewed with patient who verbalized the following symptoms: cough      Due to no new or worsening symptoms encounter was not routed to provider for escalation. Discussed follow-up appointments. If no appointment was previously scheduled, appointment scheduling offered:  yes   [facility] follow up appointment(s): No future appointments.  Non-[facility] follow up appointment(s): NA     Advance Care Planning:   Does patient have an Advance Directive:  decision maker updated.     Patient has following risk factors of: no known risk factors. LPN CC reviewed discharge instructions, medical action plan and red flags such as increased shortness of breath, increasing fever and signs of decompensation with patient who verbalized understanding.   Discussed exposure protocols and quarantine with CDC Guidelines "What to do if you are sick with coronavirus disease 2019." Patient was given an opportunity for questions and concerns. The patient agrees to contact the [facility] exposure line [phone], local health department [place] [facility] of Health  [phone] and PCP office for questions related to their healthcare. LPN CC provided contact information for future needs.    Reviewed and educated patient on any new and changed medications related to discharge diagnosis     Was patient discharged with a pulse oximeter? no .    Patient/family/caregiver given information for [facility] and agrees  to enroll no      Plan for follow-up call in 5-7 days based on severity of symptoms and risk factors.

## 2019-06-17 NOTE — Progress Notes (Signed)
Patient contacted regarding COVID-19 diagnosis". Discussed COVID-19 related testing which was not done at this time. Test results were not done. Patient informed of results, if available? yes     LPN Care Coordinator contacted the patient by telephone to perform post discharge assessment. Call within 2 business days of discharge: Yes Verified name and DOB with patient as identifiers. Provided introduction to self, and explanation of the CTN/ACM role, and reason for call due to risk factors for infection and/or exposure to COVID-19.     Symptoms reviewed with patient who verbalized the following symptoms: cough      Due to no new or worsening symptoms encounter was not routed to provider for escalation. Discussed follow-up appointments. If no appointment was previously scheduled, appointment scheduling offered:  yes   [facility] follow up appointment(s): No future appointments.  Non-[facility] follow up appointment(s): NA     Advance Care Planning:   Does patient have an Advance Directive:  decision maker updated.     Patient has following risk factors of: no known risk factors. LPN CC reviewed discharge instructions, medical action plan and red flags such as increased shortness of breath, increasing fever and signs of decompensation with patient who verbalized understanding.   Discussed exposure protocols and quarantine with CDC Guidelines ???What to do if you are sick with coronavirus disease 2019.??? Patient was given an opportunity for questions and concerns. The patient agrees to contact the [facility] exposure line [phone], local health department [place] Department of [facility]  [phone] and PCP office for questions related to their healthcare. LPN CC provided contact information for future needs.    Reviewed and educated patient on any new and changed medications related to discharge diagnosis     Was patient discharged with a pulse oximeter? no .    Patient/family/caregiver given information for [facility] and agrees  to enroll no      Plan for follow-up call in 5-7 days based on severity of symptoms and risk factors.

## 2019-06-17 NOTE — ACP (Advance Care Planning) (Signed)
decision maker updated

## 2019-06-24 NOTE — Progress Notes (Signed)
Patient resolved from Transition of Care episode on 06/24/19.   ACM/CTN was unsuccessful at contacting this patient today.  Patient/family was provided the following resources and education related to COVID-19 during the initial call:                         Signs, symptoms and red flags related to COVID-19            CDC exposure and quarantine guidelines            [facility] exposure contact - [phone]            Contact for their local Department of Health                 Patient has not had any additional ED or hospital visits.     No further outreach scheduled with this CTN/ACM.  Episode of Care resolved.  Patient has this CTN/ACM contact information if future needs arise.

## 2019-06-24 NOTE — Progress Notes (Signed)
Patient resolved from Transition of Care episode on 06/24/19.   ACM/CTN was unsuccessful at contacting this patient today.  Patient/family was provided the following resources and education related to COVID-19 during the initial call:                         Signs, symptoms and red flags related to COVID-19            CDC exposure and quarantine guidelines            [facility] exposure contact - [phone]            Contact for their local Department of Health                 Patient has not had any additional ED or hospital visits.     No further outreach scheduled with this CTN/ACM.  Episode of Care resolved.  Patient has this CTN/ACM contact information if future needs arise.

## 2020-07-10 ENCOUNTER — Emergency Department: Admit: 2020-07-10 | Payer: MEDICAID | Primary: Family

## 2020-07-10 ENCOUNTER — Inpatient Hospital Stay
Admit: 2020-07-10 | Discharge: 2020-07-11 | Disposition: A | Discharge status: Home / Self Care | Payer: MEDICAID | Attending: Emergency Medicine

## 2020-07-10 DIAGNOSIS — T730XXA Starvation, initial encounter: Secondary | ICD-10-CM

## 2020-07-10 LAB — CBC WITH AUTOMATED DIFF
ABS. BASOPHILS: 0 10*3/uL (ref 0.0–0.1)
ABS. EOSINOPHILS: 0 10*3/uL (ref 0.0–0.4)
ABS. IMM. GRANS.: 0.1 10*3/uL — ABNORMAL HIGH (ref 0.00–0.04)
ABS. LYMPHOCYTES: 1.5 10*3/uL (ref 0.8–3.5)
ABS. MONOCYTES: 1.1 10*3/uL — ABNORMAL HIGH (ref 0.0–1.0)
ABS. NEUTROPHILS: 8 10*3/uL (ref 1.8–8.0)
ABSOLUTE NRBC: 0 10*3/uL (ref 0.00–0.01)
BASOPHILS: 0 % (ref 0–1)
EOSINOPHILS: 0 % (ref 0–7)
HCT: 40.3 % (ref 36.6–50.3)
HGB: 12.8 g/dL (ref 12.1–17.0)
IMMATURE GRANULOCYTES: 1 % — ABNORMAL HIGH (ref 0.0–0.5)
LYMPHOCYTES: 14 % (ref 12–49)
MCH: 25 PG — ABNORMAL LOW (ref 26.0–34.0)
MCHC: 31.8 g/dL (ref 30.0–36.5)
MCV: 78.9 FL — ABNORMAL LOW (ref 80.0–99.0)
MONOCYTES: 10 % (ref 5–13)
MPV: 12.2 FL (ref 8.9–12.9)
NEUTROPHILS: 75 % (ref 32–75)
NRBC: 0 PER 100 WBC
PLATELET: 158 10*3/uL (ref 150–400)
RBC: 5.11 M/uL (ref 4.10–5.70)
RDW: 14.5 % (ref 11.5–14.5)
WBC: 10.6 10*3/uL (ref 4.1–11.1)

## 2020-07-10 LAB — METABOLIC PANEL, COMPREHENSIVE
A-G Ratio: 0.9 — ABNORMAL LOW (ref 1.1–2.2)
ALT (SGPT): 79 U/L — ABNORMAL HIGH (ref 12–78)
AST (SGOT): 61 U/L — ABNORMAL HIGH (ref 15–37)
Albumin: 4 g/dL (ref 3.5–5.0)
Alk. phosphatase: 74 U/L (ref 45–117)
Anion gap: 6 mmol/L (ref 5–15)
BUN/Creatinine ratio: 12 (ref 12–20)
BUN: 18 MG/DL (ref 6–20)
Bilirubin, total: 0.8 MG/DL (ref 0.2–1.0)
CO2: 28 mmol/L (ref 21–32)
Calcium: 10 MG/DL (ref 8.5–10.1)
Chloride: 101 mmol/L (ref 97–108)
Creatinine: 1.48 MG/DL — ABNORMAL HIGH (ref 0.70–1.30)
GFR est AA: >60 mL/min/{1.73_m2} (ref >60)
GFR est non-AA: 55 mL/min/{1.73_m2} — ABNORMAL LOW (ref >60)
Globulin: 4.6 g/dL — ABNORMAL HIGH (ref 2.0–4.0)
Glucose: 88 mg/dL (ref 65–100)
Potassium: 4.3 mmol/L (ref 3.5–5.1)
Protein, total: 8.6 g/dL — ABNORMAL HIGH (ref 6.4–8.2)
Sodium: 135 mmol/L — ABNORMAL LOW (ref 136–145)

## 2020-07-10 LAB — SAMPLES BEING HELD: SAMPLES BEING HELD: 1

## 2020-07-10 LAB — D DIMER: D-dimer: 0.24 mg/L FEU (ref 0.00–0.65)

## 2020-07-10 LAB — COMPREHENSIVE METABOLIC PANEL
ALT: 79 U/L — ABNORMAL HIGH (ref 12–78)
AST: 61 U/L — ABNORMAL HIGH (ref 15–37)
Albumin/Globulin Ratio: 0.9 — ABNORMAL LOW (ref 1.1–2.2)
Albumin: 4 g/dL (ref 3.5–5.0)
Alkaline Phosphatase: 74 U/L (ref 45–117)
Anion Gap: 6 mmol/L (ref 5–15)
BUN/Creatinine Ratio: 12 (ref 12–20)
BUN: 18 MG/DL (ref 6–20)
CO2: 28 mmol/L (ref 21–32)
Calcium: 10 MG/DL (ref 8.5–10.1)
Chloride: 101 mmol/L (ref 97–108)
Creatinine: 1.48 MG/DL — ABNORMAL HIGH (ref 0.70–1.30)
GFR African American: >60 mL/min/{1.73_m2} (ref >60)
Globulin: 4.6 g/dL — ABNORMAL HIGH (ref 2.0–4.0)
Glucose: 88 mg/dL (ref 65–100)
Potassium: 4.3 mmol/L (ref 3.5–5.1)
Sodium: 135 mmol/L — ABNORMAL LOW (ref 136–145)
Total Bilirubin: 0.8 MG/DL (ref 0.2–1.0)
Total Protein: 8.6 g/dL — ABNORMAL HIGH (ref 6.4–8.2)
eGFR NON-AA: 55 mL/min/{1.73_m2} — ABNORMAL LOW (ref >60)

## 2020-07-10 LAB — CBC WITH AUTO DIFFERENTIAL
Basophils %: 0 % (ref 0–1)
Basophils Absolute: 0 10*3/uL (ref 0.0–0.1)
Eosinophils %: 0 % (ref 0–7)
Eosinophils Absolute: 0 10*3/uL (ref 0.0–0.4)
Granulocyte Absolute Count: 0.1 10*3/uL — ABNORMAL HIGH (ref 0.00–0.04)
Hematocrit: 40.3 % (ref 36.6–50.3)
Hemoglobin: 12.8 g/dL (ref 12.1–17.0)
Immature Granulocytes %: 1 % — ABNORMAL HIGH (ref 0.0–0.5)
Lymphocytes %: 14 % (ref 12–49)
Lymphocytes Absolute: 1.5 10*3/uL (ref 0.8–3.5)
MCH: 25 PG — ABNORMAL LOW (ref 26.0–34.0)
MCHC: 31.8 g/dL (ref 30.0–36.5)
MCV: 78.9 FL — ABNORMAL LOW (ref 80.0–99.0)
MPV: 12.2 FL (ref 8.9–12.9)
Monocytes %: 10 % (ref 5–13)
Monocytes Absolute: 1.1 10*3/uL — ABNORMAL HIGH (ref 0.0–1.0)
NRBC Absolute: 0 10*3/uL (ref 0.00–0.01)
Neutrophils %: 75 % (ref 32–75)
Neutrophils Absolute: 8 10*3/uL (ref 1.8–8.0)
Nucleated RBCs: 0 PER 100 WBC
Platelets: 158 10*3/uL (ref 150–400)
RBC: 5.11 M/uL (ref 4.10–5.70)
RDW: 14.5 % (ref 11.5–14.5)
WBC: 10.6 10*3/uL (ref 4.1–11.1)

## 2020-07-10 LAB — D-DIMER, QUANTITATIVE: D-Dimer, Quant: 0.24 mg/L FEU (ref 0.00–0.65)

## 2020-07-10 MED ORDER — KETOROLAC TROMETHAMINE 30 MG/ML INJECTION
30 mg/mL (1 mL) | INTRAMUSCULAR | Status: AC
Start: 2020-07-10 — End: 2020-07-10
  Administered 2020-07-10: via INTRAVENOUS

## 2020-07-10 MED ORDER — CLINDAMYCIN 150 MG CAP
150 mg | ORAL | Status: AC
Start: 2020-07-10 — End: 2020-07-10
  Administered 2020-07-10: via ORAL

## 2020-07-10 MED ORDER — PENICILLIN V-K 250 MG TAB
250 mg | ORAL | Status: DC
Start: 2020-07-10 — End: 2020-07-10

## 2020-07-10 MED ORDER — SODIUM CHLORIDE 0.9% BOLUS IV
0.9 % | Freq: Once | INTRAVENOUS | Status: AC
Start: 2020-07-10 — End: 2020-07-10
  Administered 2020-07-10: via INTRAVENOUS

## 2020-07-10 MED ORDER — ACETAMINOPHEN 500 MG TAB
500 mg | ORAL | Status: AC
Start: 2020-07-10 — End: 2020-07-10
  Administered 2020-07-10: via ORAL

## 2020-07-10 MED FILL — SODIUM CHLORIDE 0.9 % IV: INTRAVENOUS | Qty: 1000

## 2020-07-10 MED FILL — ACETAMINOPHEN 500 MG TAB: 500 mg | ORAL | Qty: 2

## 2020-07-10 MED FILL — CLINDAMYCIN 150 MG CAP: 150 mg | ORAL | Qty: 2

## 2020-07-10 MED FILL — KETOROLAC TROMETHAMINE 30 MG/ML INJECTION: 30 mg/mL (1 mL) | INTRAMUSCULAR | Qty: 1

## 2020-07-10 NOTE — ED Notes (Signed)
Spoke with [place] to sch'd pt ride to home. Confirmation #: [id number]
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
## 2020-07-11 LAB — EKG, 12 LEAD, INITIAL
Atrial Rate: 91 {beats}/min
Calculated P Axis: 46 degrees
Calculated R Axis: 28 degrees
Calculated T Axis: 4 degrees
Diagnosis: NORMAL
P-R Interval: 134 ms
Q-T Interval: 344 ms
QRS Duration: 78 ms
QTC Calculation (Bezet): 423 ms
Ventricular Rate: 91 {beats}/min

## 2020-07-11 LAB — INFLUENZA A+B VIRAL AGS
Flu A Antigen: NEGATIVE
Influenza A Antigen: NEGATIVE
Influenza B Antigen: NEGATIVE
Influenza B Antigen: NEGATIVE

## 2020-07-11 LAB — TROPONIN-HIGH SENSITIVITY: Troponin-High Sensitivity: <4 ng/L (ref 0–76)

## 2020-07-11 LAB — LIPASE
Lipase: 168 U/L (ref 73–393)
Lipase: 168 U/L (ref 73–393)

## 2020-07-11 LAB — EKG 12-LEAD
Atrial Rate: 91 {beats}/min
Diagnosis: NORMAL
P Axis: 46 degrees
P-R Interval: 134 ms
Q-T Interval: 344 ms
QRS Duration: 78 ms
QTc Calculation (Bazett): 423 ms
R Axis: 28 degrees
T Axis: 4 degrees
Ventricular Rate: 91 {beats}/min

## 2020-07-11 LAB — TROPONIN, HIGH SENSITIVITY: Troponin, High Sensitivity: <4 ng/L (ref 0–76)

## 2020-07-11 MED ORDER — CLINDAMYCIN 300 MG CAP
300 mg | ORAL_CAPSULE | Freq: Four times a day (QID) | ORAL | 0 refills | Status: AC
Start: 2020-07-11 — End: 2020-07-17

## 2020-07-11 MED ORDER — IBUPROFEN 800 MG TAB
800 mg | ORAL_TABLET | Freq: Four times a day (QID) | ORAL | 0 refills | Status: AC | PRN
Start: 2020-07-11 — End: 2020-07-17

## 2020-07-11 MED ORDER — ACETAMINOPHEN 325 MG TABLET
325 mg | ORAL_TABLET | ORAL | 0 refills | Status: DC | PRN
Start: 2020-07-11 — End: 2021-04-25

## 2021-03-29 ENCOUNTER — Emergency Department: Admit: 2021-03-29 | Payer: MEDICAID | Primary: Family

## 2021-03-29 ENCOUNTER — Inpatient Hospital Stay
Admit: 2021-03-29 | Discharge: 2021-03-29 | Disposition: A | Discharge status: Home / Self Care | Payer: MEDICAID | Attending: Emergency Medicine

## 2021-03-29 DIAGNOSIS — G40909 Epilepsy, unspecified, not intractable, without status epilepticus: Secondary | ICD-10-CM

## 2021-03-29 LAB — SAMPLES BEING HELD

## 2021-03-29 LAB — URINALYSIS W/ REFLEX CULTURE
BACTERIA, URINE: NEGATIVE /hpf
Bacteria: NEGATIVE /hpf
Bilirubin, Urine: NEGATIVE
Bilirubin: NEGATIVE
Blood, Urine: NEGATIVE
Blood: NEGATIVE
Glucose, Ur: NEGATIVE mg/dL
Glucose: NEGATIVE mg/dL
Leukocyte Esterase, Urine: NEGATIVE
Leukocyte Esterase: NEGATIVE
Nitrite, Urine: NEGATIVE
Nitrites: NEGATIVE
Specific Gravity, UA: 1.018
Specific gravity: 1.018
Urobilinogen, UA, POCT: 1 EU/dL (ref 0.2–1.0)
Urobilinogen: 1 EU/dL (ref 0.2–1.0)
pH (UA): 6.5 (ref 5.0–8.0)
pH, UA: 6.5 (ref 5.0–8.0)

## 2021-03-29 LAB — DRUG SCREEN, URINE
AMPHETAMINES: NEGATIVE
Amphetamine Screen, Ur: NEGATIVE
BARBITURATES: NEGATIVE
BENZODIAZEPINES: NEGATIVE
Barbiturate Screen, Ur: NEGATIVE
Benzodiazepine Screen, Urine: NEGATIVE
COCAINE: NEGATIVE
Cocaine Screen Urine: NEGATIVE
METHADONE: NEGATIVE
Methadone Screen, Urine: NEGATIVE
OPIATES: NEGATIVE
Opiate Scrn, Ur: NEGATIVE
PCP(PHENCYCLIDINE): NEGATIVE
Phencyclidine (PCP), Screen, Urine: NEGATIVE
THC (TH-CANNABINOL): NEGATIVE
THC Screen, Urine: NEGATIVE

## 2021-03-29 LAB — CBC WITH AUTOMATED DIFF
ABS. BASOPHILS: 0 10*3/uL (ref 0.0–0.1)
ABS. EOSINOPHILS: 0 10*3/uL (ref 0.0–0.4)
ABS. IMM. GRANS.: 0 10*3/uL (ref 0.00–0.04)
ABS. LYMPHOCYTES: 1.6 10*3/uL (ref 0.8–3.5)
ABS. MONOCYTES: 0.4 10*3/uL (ref 0.0–1.0)
ABS. NEUTROPHILS: 6.1 10*3/uL (ref 1.8–8.0)
ABSOLUTE NRBC: 0 10*3/uL (ref 0.00–0.01)
BASOPHILS: 0 % (ref 0–1)
EOSINOPHILS: 0 % (ref 0–7)
HCT: 32.6 % — ABNORMAL LOW (ref 36.6–50.3)
HGB: 10.3 g/dL — ABNORMAL LOW (ref 12.1–17.0)
IMMATURE GRANULOCYTES: 1 % — ABNORMAL HIGH (ref 0.0–0.5)
LYMPHOCYTES: 19 % (ref 12–49)
MCH: 25.1 PG — ABNORMAL LOW (ref 26.0–34.0)
MCHC: 31.6 g/dL (ref 30.0–36.5)
MCV: 79.5 FL — ABNORMAL LOW (ref 80.0–99.0)
MONOCYTES: 5 % (ref 5–13)
MPV: 11.3 FL (ref 8.9–12.9)
NEUTROPHILS: 75 % (ref 32–75)
NRBC: 0 PER 100 WBC
PLATELET: 177 10*3/uL (ref 150–400)
RBC: 4.1 M/uL (ref 4.10–5.70)
RDW: 15.5 % — ABNORMAL HIGH (ref 11.5–14.5)
WBC: 8.1 10*3/uL (ref 4.1–11.1)

## 2021-03-29 LAB — METABOLIC PANEL, COMPREHENSIVE
A-G Ratio: 1 — ABNORMAL LOW (ref 1.1–2.2)
ALT (SGPT): 9 U/L — ABNORMAL LOW (ref 12–78)
AST (SGOT): 12 U/L — ABNORMAL LOW (ref 15–37)
Albumin: 3.7 g/dL (ref 3.5–5.0)
Alk. phosphatase: 64 U/L (ref 45–117)
Anion gap: 6 mmol/L (ref 5–15)
BUN/Creatinine ratio: 14 (ref 12–20)
BUN: 17 MG/DL (ref 6–20)
Bilirubin, total: 0.5 MG/DL (ref 0.2–1.0)
CO2: 26 mmol/L (ref 21–32)
Calcium: 9.3 MG/DL (ref 8.5–10.1)
Chloride: 105 mmol/L (ref 97–108)
Creatinine: 1.2 MG/DL (ref 0.70–1.30)
Globulin: 3.7 g/dL (ref 2.0–4.0)
Glucose: 92 mg/dL (ref 65–100)
Potassium: 4.5 mmol/L (ref 3.5–5.1)
Protein, total: 7.4 g/dL (ref 6.4–8.2)
Sodium: 137 mmol/L (ref 136–145)
eGFR: >60 mL/min/{1.73_m2} (ref >60)

## 2021-03-29 LAB — TROPONIN-HIGH SENSITIVITY
Troponin-High Sensitivity: 6 ng/L (ref 0–76)
Troponin-High Sensitivity: 7 ng/L (ref 0–76)

## 2021-03-29 LAB — VALPROIC ACID
Valproic Acid: 136 ug/ml — ABNORMAL HIGH (ref 50–100)
Valproic acid: 136 ug/ml — ABNORMAL HIGH (ref 50–100)

## 2021-03-29 LAB — MAGNESIUM
Magnesium: 2.7 mg/dL — ABNORMAL HIGH (ref 1.6–2.4)
Magnesium: 2.7 mg/dL — ABNORMAL HIGH (ref 1.6–2.4)

## 2021-03-29 LAB — CBC WITH AUTO DIFFERENTIAL
Basophils %: 0 % (ref 0–1)
Basophils Absolute: 0 10*3/uL (ref 0.0–0.1)
Eosinophils %: 0 % (ref 0–7)
Eosinophils Absolute: 0 10*3/uL (ref 0.0–0.4)
Granulocyte Absolute Count: 0 10*3/uL (ref 0.00–0.04)
Hematocrit: 32.6 % — ABNORMAL LOW (ref 36.6–50.3)
Hemoglobin: 10.3 g/dL — ABNORMAL LOW (ref 12.1–17.0)
Immature Granulocytes %: 1 % — ABNORMAL HIGH (ref 0.0–0.5)
Lymphocytes %: 19 % (ref 12–49)
Lymphocytes Absolute: 1.6 10*3/uL (ref 0.8–3.5)
MCH: 25.1 PG — ABNORMAL LOW (ref 26.0–34.0)
MCHC: 31.6 g/dL (ref 30.0–36.5)
MCV: 79.5 FL — ABNORMAL LOW (ref 80.0–99.0)
MPV: 11.3 FL (ref 8.9–12.9)
Monocytes %: 5 % (ref 5–13)
Monocytes Absolute: 0.4 10*3/uL (ref 0.0–1.0)
NRBC Absolute: 0 10*3/uL (ref 0.00–0.01)
Neutrophils %: 75 % (ref 32–75)
Neutrophils Absolute: 6.1 10*3/uL (ref 1.8–8.0)
Nucleated RBCs: 0 PER 100 WBC
Platelets: 177 10*3/uL (ref 150–400)
RBC: 4.1 M/uL (ref 4.10–5.70)
RDW: 15.5 % — ABNORMAL HIGH (ref 11.5–14.5)
WBC: 8.1 10*3/uL (ref 4.1–11.1)

## 2021-03-29 LAB — COMPREHENSIVE METABOLIC PANEL
ALT: 9 U/L — ABNORMAL LOW (ref 12–78)
AST: 12 U/L — ABNORMAL LOW (ref 15–37)
Albumin/Globulin Ratio: 1 — ABNORMAL LOW (ref 1.1–2.2)
Albumin: 3.7 g/dL (ref 3.5–5.0)
Alkaline Phosphatase: 64 U/L (ref 45–117)
Anion Gap: 6 mmol/L (ref 5–15)
BUN/Creatinine Ratio: 14 (ref 12–20)
BUN: 17 MG/DL (ref 6–20)
CO2: 26 mmol/L (ref 21–32)
Calcium: 9.3 MG/DL (ref 8.5–10.1)
Chloride: 105 mmol/L (ref 97–108)
Creatinine: 1.2 MG/DL (ref 0.70–1.30)
Est, Glom Filt Rate: >60 mL/min/{1.73_m2} (ref >60)
Globulin: 3.7 g/dL (ref 2.0–4.0)
Glucose: 92 mg/dL (ref 65–100)
Potassium: 4.5 mmol/L (ref 3.5–5.1)
Sodium: 137 mmol/L (ref 136–145)
Total Bilirubin: 0.5 MG/DL (ref 0.2–1.0)
Total Protein: 7.4 g/dL (ref 6.4–8.2)

## 2021-03-29 LAB — TROPONIN, HIGH SENSITIVITY
Troponin, High Sensitivity: 6 ng/L (ref 0–76)
Troponin, High Sensitivity: 7 ng/L (ref 0–76)

## 2021-03-29 MED ORDER — SODIUM CHLORIDE 0.9 % IJ SYRG
INTRAMUSCULAR | Status: DC | PRN
Start: 2021-03-29 — End: 2021-03-29

## 2021-03-29 MED FILL — BD POSIFLUSH NORMAL SALINE 0.9 % INJECTION SYRINGE: INTRAMUSCULAR | Qty: 40

## 2021-03-29 NOTE — ED Notes (Signed)
Report given to lexie,rn

## 2021-03-29 NOTE — ED Provider Notes (Signed)
ED Provider Notes by Lestine Box, MD at 03/29/21 [phone]                Author: O'Bier, Jimmy Plessinger N, MD  Service: Emergency Medicine  Author Type: Physician       Filed: 03/31/21 1129  Date of Service: 03/29/21 0713  Status: Signed          Editor: O'Bier, Sharhonda Atwood N, MD (Physician)               [facility] EM[facility] DEPT  EMERGENCY DEPARTMENT ENCOUNTER            Pt Name: Connor Prince   MRN: [id number]   [place] 1986/01/29   Date of evaluation: 03/29/2021   Provider: Eboney Claybrook Jonelle Sidle, MD    PCP: Durwin Reges, NP   Note Started: 7:13 AM 03/29/21         CHIEF COMPLAINT            Chief Complaint       Patient presents with        ?  Chest Pain     ?  Headache     ?  Seizure             Pt arrives to ed via ems, was sitting on couch speaking with rpd, pt had sudden onset substernal cp. Per ems pt was talking with police when police  noticed pt having a seizure, per ems pt has a hx of seizure, and has seizures when they are stressed. Per ems pt has headaches after seizures.               HISTORY OF PRESENT ILLNESS: 1 or more elements         History From: Patient and EMS          Connor Prince is a 36 y.o. male with history of asthma, kidney stones, seizure disorder who presents to the ED with chief complaint of chest pain and seizure.  Patient was getting a police report regarding his roommate when he started having substernal  chest pain at about 2:40 AM.  He then had a generalized tonic-clonic seizure in presence of police.  Seizure resolved spontaneously.  Patient reports he has history of seizures when he is under stress.  Reports headache at this time and continued chest  pain.  Denies any nausea, vomiting, dizziness, slurred speech, leg swelling, calf pain.  Reports mild shortness of breath.  He does not use any drugs.  He states he drinks socially.  Denies history of withdrawal symptoms.  Patient followed by Dr.Ono (neurology.).   He takes Keppra 500 mg twice a day and valproic acid 2000 mg nightly for  his seizures.       Nursing Notes were all reviewed and agreed with or any disagreements were addressed in the HPI.         REVIEW OF SYSTEMS         Review of Systems       Positives and Pertinent negatives as per HPI.        PAST HISTORY        Past Medical History:     Past Medical History:        Diagnosis  Date         ?  Asthma       ?  Chest pain       ?  Cough       ?  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)       ?  Kidney stone       ?  Migraine       ?  Muscle pain       ?  Seizures ([facility])       ?  Skipped beats       ?  SOB (shortness of breath)           ?  Vitamin D deficiency  08/14/2016           Past Surgical History:   No past surgical history on file.      Family History:     Family History         Problem  Relation  Age of Onset          ?  Cancer  Mother                stomach          ?  Migraines  Mother       ?  Seizures  Mother            ?  No Known Problems  Father             Social History:     Social History          Tobacco Use         ?  Smoking status:  Former              Packs/day:  0.25         Types:  Cigarettes         ?  Smokeless tobacco:  Never        ?  Tobacco comments:             approx 2-3       Substance Use Topics         ?  Alcohol use:  Yes             Comment: Occassionally         ?  Drug use:  No           Allergies:   No Known Allergies        CURRENT MEDICATIONS           Previous Medications           ACETAMINOPHEN (TYLENOL) 325 MG TABLET     Take 2 Tablets by mouth every four (4) hours as needed for Pain.           AMOXICILLIN (AMOXIL) 500 MG CAPSULE     Take 500 mg by mouth three (3) times daily.           DIVALPROEX DR (DEPAKOTE) 500 MG TABLET     Take 2,000 mg by mouth daily.           LEVETIRACETAM (KEPPRA) 500 MG TABLET     Take 500 mg by mouth two (2) times a day.             PHYSICAL EXAM           ED Triage Vitals     ED Encounter Vitals Group            BP  03/29/21 0332  136/85        Pulse (Heart Rate)  03/29/21 0332  81        Resp Rate  03/29/21 0332  14        Temp   03/29/21 0343  98.3 ??F (36.8 ??C)        Temp src  --          O2 Sat (%)  03/29/21 0332  100 %        Weight  03/29/21 0332  210 lb            Height  03/29/21 0332  6' 6"             Physical Exam   Constitutional :        General: He is not in acute distress.      Appearance: He is well-developed. He is not toxic-appearing.    HENT:       Head: Normocephalic.    Eyes:       Pupils: Pupils are equal, round, and reactive to light.     Cardiovascular:       Rate and Rhythm: Normal rate and regular rhythm.       Heart sounds: Normal heart sounds. No murmur heard.   Pulmonary:       Effort: Pulmonary effort is normal. No accessory muscle usage or respiratory distress.       Breath sounds: Normal breath sounds. No wheezing, rhonchi or rales.     Musculoskeletal:          General: Normal range of motion.       Cervical back: Normal range of motion.       Right lower leg: No edema.       Left lower leg: No edema.    Skin:      General: Skin is warm and dry.       Capillary Refill: Capillary refill takes less than 2 seconds.       Coloration: Skin is not cyanotic.    Neurological:       General: No focal deficit present.       Mental Status: He is alert and oriented to person, place, and time.    Psychiatric:          Mood and Affect: Mood is anxious.                DIAGNOSTIC RESULTS     LABS:         Recent Results (from the past 12 hour(s))     METABOLIC PANEL, COMPREHENSIVE          Collection Time: 03/29/21  3:33 AM         Result  Value  Ref Range            Sodium  137  136 - 145 mmol/L       Potassium  4.5  3.5 - 5.1 mmol/L       Chloride  105  97 - 108 mmol/L       CO2  26  21 - 32 mmol/L       Anion gap  6  5 - 15 mmol/L       Glucose  92  65 - 100 mg/dL       BUN  17  6 - 20 MG/DL       Creatinine  1.20  0.70 - 1.30 MG/DL       BUN/Creatinine ratio  14  12 - 20         eGFR  >60  >60 ml/min/1.58m       Calcium  9.3  8.5 - 10.1 MG/DL       Bilirubin, total  0.5  0.2 - 1.0 MG/DL       ALT (SGPT)  9 (L)  12 - 78  U/L       AST (SGOT)  12 (L)  15 - 37 U/L       Alk. phosphatase  64  45 - 117 U/L       Protein, total  7.4  6.4 - 8.2 g/dL       Albumin  3.7  3.5 - 5.0 g/dL       Globulin  3.7  2.0 - 4.0 g/dL       A-G Ratio  1.0 (L)  1.1 - 2.2         CBC WITH AUTOMATED DIFF          Collection Time: 03/29/21  3:33 AM         Result  Value  Ref Range            WBC  8.1  4.1 - 11.1 K/uL       RBC  4.10  4.10 - 5.70 M/uL       HGB  10.3 (L)  12.1 - 17.0 g/dL       HCT  32.6 (L)  36.6 - 50.3 %       MCV  79.5 (L)  80.0 - 99.0 FL       MCH  25.1 (L)  26.0 - 34.0 PG       MCHC  31.6  30.0 - 36.5 g/dL       RDW  15.5 (H)  11.5 - 14.5 %       PLATELET  177  150 - 400 K/uL       MPV  11.3  8.9 - 12.9 FL       NRBC  0.0  0 PER 100 WBC       ABSOLUTE NRBC  0.00  0.00 - 0.01 K/uL       NEUTROPHILS  75  32 - 75 %       LYMPHOCYTES  19  12 - 49 %       MONOCYTES  5  5 - 13 %       EOSINOPHILS  0  0 - 7 %       BASOPHILS  0  0 - 1 %       IMMATURE GRANULOCYTES  1 (H)  0.0 - 0.5 %       ABS. NEUTROPHILS  6.1  1.8 - 8.0 K/UL       ABS. LYMPHOCYTES  1.6  0.8 - 3.5 K/UL       ABS. MONOCYTES  0.4  0.0 - 1.0 K/UL       ABS. EOSINOPHILS  0.0  0.0 - 0.4 K/UL       ABS. BASOPHILS  0.0  0.0 - 0.1 K/UL       ABS. IMM. GRANS.  0.0  0.00 - 0.04 K/UL       DF  AUTOMATED          TROPONIN-HIGH SENSITIVITY          Collection Time: 03/29/21  3:33 AM         Result  Value  Ref Range            Troponin-High Sensitivity  6  0 - 76 ng/L       SAMPLES BEING HELD          Collection Time: 03/29/21  3:37 AM  Result  Value  Ref Range            SAMPLES BEING HELD  RD SST DKGRN BL         COMMENT                  Add-on orders for these samples will be processed based on acceptable specimen integrity and analyte stability, which may vary by analyte.       MAGNESIUM          Collection Time: 03/29/21  3:37 AM         Result  Value  Ref Range            Magnesium  2.7 (H)  1.6 - 2.4 mg/dL       VALPROIC ACID          Collection Time: 03/29/21  3:37 AM          Result  Value  Ref Range            Valproic acid  136 (H)  50 - 100 ug/ml       SAMPLES BEING HELD          Collection Time: 03/29/21  3:39 AM         Result  Value  Ref Range            SAMPLES BEING HELD  2RD PST         COMMENT                  Add-on orders for these samples will be processed based on acceptable specimen integrity and analyte stability, which may vary by analyte.       TROPONIN-HIGH SENSITIVITY          Collection Time: 03/29/21  3:39 AM         Result  Value  Ref Range            Troponin-High Sensitivity  7  0 - 76 ng/L       URINALYSIS W/ REFLEX CULTURE          Collection Time: 03/29/21  4:42 AM       Specimen: Urine         Result  Value  Ref Range            Color  YELLOW/STRAW          Appearance  CLEAR  CLEAR         Specific gravity  1.018          pH (UA)  6.5  5.0 - 8.0         Protein  TRACE (A)  NEG mg/dL       Glucose  Negative  NEG mg/dL       Ketone  TRACE (A)  NEG mg/dL       Bilirubin  Negative  NEG         Blood  Negative  NEG         Urobilinogen  1.0  0.2 - 1.0 EU/dL       Nitrites  Negative  NEG         Leukocyte Esterase  Negative  NEG         UA:UC IF INDICATED  CULTURE NOT INDICATED BY UA RESULT  CNI         WBC  0-4  0 - 4 /hpf       RBC  0-5  0 - 5 /hpf       Epithelial cells  FEW  FEW /lpf       Bacteria  Negative  NEG /hpf       Hyaline cast  0-2  0 - 2 /lpf       DRUG SCREEN, URINE          Collection Time: 03/29/21  4:42 AM         Result  Value  Ref Range            AMPHETAMINES  Negative  NEG         BARBITURATES  Negative  NEG         BENZODIAZEPINES  Negative  NEG         COCAINE  Negative  NEG         METHADONE  Negative  NEG         OPIATES  Negative  NEG         PCP(PHENCYCLIDINE)  Negative  NEG         THC (TH-CANNABINOL)  Negative  NEG              Drug screen comment  (NOTE)              EKG at 3:26 AM on 03/29/2021 interpreted by me: Normal sinus rhythm, 88 bpm, normal axis, normal PR, QRS, QTc intervals, ST elevation in leads I, aVL, V2 and V3 concerning  for STEMI    EKG  at 3:34 AM on 03/29/2021 interpreted by me: Normal sinus rhythm, 85 bpm, normal axis, normal PR, QRS, QTc intervals, less pronounced ST elevation in leads I and V2 through V3.       RADIOLOGY:   Non-plain film images such as CT, Ultrasound and MRI are read by the radiologist. Plain radiographic images are visualized and preliminarily interpreted by the ED Provider with the below findings:       Portable chest x-ray at 4:10 AM on 03/29/2021 interpreted by me: No pneumothorax, no pleural effusion, no infiltrate       Interpretation per the Radiologist below, if available at the time of this note:       XR CHEST PORT      Result Date: 03/29/2021   EXAM:  XR CHEST PORT INDICATION: Chest pain COMPARISON: 07/10/2020 TECHNIQUE: portable chest AP view FINDINGS: The cardiac silhouette is within normal limits. The pulmonary vasculature is within normal limits. The lungs and pleural spaces are clear. The  visualized bones and upper abdomen are age-appropriate.       No acute process on portable chest.                   CRITICAL CARE TIME     CRITICAL CARE NOTE :         IMPENDING DETERIORATION -Cardiovascular, CNS, and Metabolic   ASSOCIATED RISK FACTORS - Metabolic changes and CNS Decompensation   MANAGEMENT- Bedside Assessment and Supervision of Care   INTERPRETATION -  Blood Gases, ECG, and Blood Pressure   INTERVENTIONS - Neurologic interventions  and Metobolic interventions   CASE REVIEW - Medical Sub-Specialist and Nursing   TREATMENT RESPONSE -Improved   PERFORMED BY - Self      NOTES   :   I have spent 50 minutes of critical care time involved in lab review, consultations with specialist, family decision- making, bedside attention and documentation.  This time excludes time spent in any separate billed procedures.  During this entire length of time I was immediately available to the patient .      Tonia Avino Jonelle Sidle, MD         EMERGENCY DEPARTMENT COURSE and DIFFERENTIAL DIAGNOSIS/[facility]     Vitals:       Vitals:              03/29/21 0332  03/29/21 0342  03/29/21 0343  03/29/21 0430           BP:  136/85      121/71     Pulse:  81      81     Resp:  14      15     Temp:      98.3 ??F (36.8 ??C)       SpO2:  100%  100%    97%     Weight:  95.3 kg (210 lb)                 Height:  6' 6"  (1.981 m)                  Patient was given the following medications:     Medications       sodium chloride (NS) flush 5-40 mL (has no administration in time range)           CONSULTS: (Who and What was discussed)   Cardiology (Dr. Leontine Locket).  Reviewed EKG findings and presentation.  EKG is sent to Dr. Leontine Locket to review given concern for STEMI.  He does not think EKG abnormalities represent STEMI, but rather repolarization.      Chronic Conditions: Seizure disorder            Records Reviewed (source and summary of external notes): Reviewed EKG from 11/6/17which shows early repolarization abnormality.  Also reviewed  notes from [facility] ED visit on 05/16/2020 which showed EKG with early repolarization.  Patient followed by Dr. Joselyn Arrow at [facility] neurology.  Reviewed [facility] ED note from 11/29/2020 when patient was seen for a breakthrough seizure.  Patient had EEG on 10/08/2019 which  showed abnormal suspicious poorly lateralized/localized seizures.  Patient has had multiple previous head CTs without intracranial abnormality.  At that visit, patient's Depakote dose was increased from 500 mg at bedtime to 2000 mg at bedtime.      CC/HPI Summary, DDx, ED Course, and Reassessment: Patient presents to the ED with chest pain, headache, and a seizure tonight while talking to  the police about his roommate.  Differential diagnosis includes breakthrough seizure, medication noncompliance, GERD, acute coronary syndrome, anxiety    Labs ordered including CBC, CMP, troponin, chest x-ray          ED Course as of 03/29/21 0713       Wed Mar 29, 2021        2956  Case discussed with Dr. Leontine Locket who has reviewed EKGs.  He thinks that EKG findings represent early repolarization as  opposed to ischemia.  [AO]        [id number]  Labs and imaging reviewed.CBC is remarkable for moderate anemia with hemoglobin of 10.3.  CMP shows no acute abnormality.  Valproic acid is elevated at  136.  Magnesium is elevated at 2.7.  Initial high-sensitivity troponin is 6.  UA is unremarkable.  Chest x-ray at 4:27 AM interpreted by me does not show any pneumonia, pneumothorax, large pleural effusion.  Patient is feeling much better.  Will recheck  troponin and if negative plan  to discharge with follow-up with PCP, neurology and cardiology. [AO]              ED Course User Index   [AO] O'Bier, Travis Mastel N, MD                FINAL IMPRESSION           1.  Seizure ([place])         2.  Acute chest pain                DISPOSITION/PLAN     Discharged             PATIENT REFERRED TO:     Follow-up Information                  Follow up With  Specialties  Details  Why  Contact Info              Durwin Reges, NP  Nurse Practitioner      [address] [place] [address]   [phone]          Your neurologist    Call today                  [facility]  Emergency Medicine    If symptoms worsen  [place]   [phone]                       DISCHARGE MEDICATIONS:     Current Discharge Medication List                    DISCONTINUED MEDICATIONS:     Current Discharge Medication List                  I am the Primary Clinician of Record.    Trevonn Hallum Jonelle Sidle, MD (electronically signed)      (Please note that parts of this dictation were completed with voice recognition software. Quite often unanticipated grammatical, syntax, homophones, and other interpretive errors are inadvertently transcribed by the computer software. Please disregards  these errors. Please excuse any errors that have escaped final proofreading.)

## 2021-03-29 NOTE — ED Notes (Signed)
Pt provided with 2 pillows, pt denies any further needs at this time, call bell in reach, bed in lowest position

## 2021-03-30 LAB — EKG, 12 LEAD, INITIAL
Atrial Rate: 88 {beats}/min
Calculated P Axis: 19 degrees
Calculated R Axis: 13 degrees
Calculated T Axis: 3 degrees
Diagnosis: NORMAL
P-R Interval: 136 ms
Q-T Interval: 338 ms
QRS Duration: 92 ms
QTC Calculation (Bezet): 408 ms
Ventricular Rate: 88 {beats}/min

## 2021-03-30 LAB — LEVETIRACETAM (KEPPRA)
KEPPRA,KEPP: 35.1 ug/mL (ref 10.0–40.0)
Levetiracetam (Keppra): 35.1 ug/mL (ref 10.0–40.0)

## 2021-03-30 LAB — EKG 12-LEAD
Atrial Rate: 88 {beats}/min
Diagnosis: NORMAL
P Axis: 19 degrees
P-R Interval: 136 ms
Q-T Interval: 338 ms
QRS Duration: 92 ms
QTc Calculation (Bazett): 408 ms
R Axis: 13 degrees
T Axis: 3 degrees
Ventricular Rate: 88 {beats}/min

## 2021-04-25 ENCOUNTER — Inpatient Hospital Stay
Admit: 2021-04-25 | Discharge: 2021-04-26 | Disposition: A | Discharge status: Home / Self Care | Payer: MEDICAID | Attending: Emergency Medicine

## 2021-04-25 DIAGNOSIS — K047 Periapical abscess without sinus: Secondary | ICD-10-CM

## 2021-04-25 NOTE — ED Provider Notes (Signed)
History of Present Illness:  Patient reports, right lower dental pain and swelling x 2 days. Denies fevers.     Past Medical History:   Diagnosis Date    Asthma     Chest pain     Cough     EP (epilepsy) ([facility])     Kidney stone     Migraine     Muscle pain     Seizures ([facility])     Skipped beats     SOB (shortness of breath)     Vitamin D deficiency 08/14/2016       No past surgical history on file.      Family History:   Problem Relation Age of Onset    Cancer Mother         stomach    Migraines Mother     Seizures Mother     No Known Problems Father        Social History     Socioeconomic History    Marital status: SINGLE     Spouse name: Not on file    Number of children: Not on file    Years of education: Not on file    Highest education level: Not on file   Occupational History    Not on file   Tobacco Use    Smoking status: Former     Packs/day: 0.25     Types: Cigarettes    Smokeless tobacco: Never    Tobacco comments:     approx 2-3   Substance and Sexual Activity    Alcohol use: Yes     Comment: Occassionally    Drug use: No    Sexual activity: Yes     Partners: Male     Birth control/protection: None   Other Topics Concern    Not on file   Social History Narrative    On disability     Social Determinants of Health     Financial Resource Strain: Not on file   Food Insecurity: Not on file   Transportation Needs: Not on file   Physical Activity: Not on file   Stress: Not on file   Social Connections: Not on file   Intimate Partner Violence: Not on file   Housing Stability: Not on file         ALLERGIES: Patient has no known allergies.    Review of Systems   HENT:  Positive for dental problem.    All other systems reviewed and are negative.    Vitals:    04/25/21 1902   BP: 139/70   Pulse: 80   Resp: 18   Temp: 98.4 ??F (36.9 ??C)   SpO2: 100%   Weight: 99.3 kg (218 lb 14.7 oz)   Height: 6\' 6"  (1.981 m)            Physical Exam  Vitals and nursing note reviewed.   Constitutional:       General: He is not in acute  distress.     Appearance: Normal appearance. He is not ill-appearing, toxic-appearing or diaphoretic.   HENT:      Head: Normocephalic and atraumatic.      Right Ear: External ear normal.      Left Ear: External ear normal.      Nose: Nose normal.      Mouth/Throat:      Dentition: Abnormal dentition. Dental tenderness, gingival swelling, dental caries and dental abscesses present.   Eyes:  General: No scleral icterus.        Right eye: No discharge.         Left eye: No discharge.      Extraocular Movements: Extraocular movements intact.      Conjunctiva/sclera: Conjunctivae normal.   Cardiovascular:      Rate and Rhythm: Normal rate.   Pulmonary:      Effort: Pulmonary effort is normal. No respiratory distress.      Breath sounds: No stridor.   Abdominal:      General: Abdomen is flat. There is no distension.   Musculoskeletal:         General: No deformity. Normal range of motion.      Cervical back: Neck supple. No rigidity.   Skin:     Coloration: Skin is not jaundiced.      Findings: No rash.   Neurological:      General: No focal deficit present.      Mental Status: He is alert and oriented to person, place, and time. Mental status is at baseline.   Psychiatric:         Mood and Affect: Mood normal.         Behavior: Behavior normal.         Thought Content: Thought content normal.         Judgment: Judgment normal.        No results found for this or any previous visit (from the past 72 hour(s)).    No orders to display       Medical Decision Making  I considered dental caries, dental abscess, other odontogenic infections including Ludwick's angina, gingivitis, trench mouth and dental fractures as well as other causes.        Amount and/or Complexity of Data Reviewed  External Data Reviewed: notes.     Details: Prior visits for chest pain, seizure, low back pain, dental pain, chest pain and seizures    Risk  OTC drugs.  Prescription drug management.  Risk Details: The patient ultiumately did not warrant  hospitalization nor any acute surgical intervention, I considered the need for both.  Also considered the need to obtain additional imaging and/or labs beyond what may have been ordered but no additional testing was indicated based on the patient's condition and results of any other testing that may have been performed.  Any medications given here and/or prescribed for discharge are documented within the other portions of the note.  After any medications or treatments that may have been provided here the patient was improved and symptoms had resolved or become tolerable or no medications or treatments were indicated aced on the patient's condition.  The patient was deemed stable safe and appropriate for discharge to home and is instructed to follow-up with his primary care doctor and return for any new or worsening symptoms.           Procedures    Medications   acetaminophen (TYLENOL) tablet 1,000 mg (1,000 mg Oral Given 04/25/21 1945)   naproxen (NAPROSYN) tablet 500 mg (500 mg Oral Given 04/25/21 1945)   amoxicillin-clavulanate (AUGMENTIN) 875-125 mg per tablet 1 Tablet (1 Tablet Oral Given 04/25/21 1945)   dental ball (lidocaine/benadryl/benzocaine) mixture ( Mucous Membrane Given 04/25/21 1945)          My Medications        START taking these medications        Instructions Each Dose to Equal Morning Noon Evening Bedtime   amoxicillin-clavulanate 875-125  mg per tablet  Commonly known as: Augmentin    Your last dose was:     Your next dose is:         Take 1 Tablet by mouth two (2) times a day for 10 days.   1 Tablet                 benzocaine 20 % Gel topical gel  Commonly known as: ORAJEL    Your last dose was:     Your next dose is:         Apply 1 Dose to affected area four (4) times daily as needed for Pain.   1 Dose                 naproxen 500 mg tablet  Commonly known as: Naprosyn    Your last dose was:     Your next dose is:         Take 1 Tablet by mouth two (2) times daily (with meals) for 10 days.   500  mg                        CHANGE how you take these medications        Instructions Each Dose to Equal Morning Noon Evening Bedtime   acetaminophen 325 mg tablet  Commonly known as: TYLENOL  What changed:   how much to take  when to take this    Your last dose was:     Your next dose is:         Take 3 Tablets by mouth every six (6) hours as needed for Pain.   975 mg                        STOP taking these medications      amoxicillin 500 mg capsule  Commonly known as: AMOXIL               ASK your doctor about these medications        Instructions Each Dose to Equal Morning Noon Evening Bedtime   divalproex DR 500 mg tablet  Commonly known as: DEPAKOTE    Your last dose was:     Your next dose is:         Take 2,000 mg by mouth daily.   2,000 mg                 levETIRAcetam 500 mg tablet  Commonly known as: KEPPRA    Your last dose was:     Your next dose is:         Take 500 mg by mouth two (2) times a day.   500 mg                           Where to Get Your Medications        These medications were sent to [facility] [place], [place] - [address] [facility] [phone]  [address], [place] [address]      Phone: [phone]   acetaminophen 325 mg tablet  amoxicillin-clavulanate 875-125 mg per tablet  benzocaine 20 % Gel topical gel  naproxen 500 mg tablet         Encounter Diagnoses     ICD-10-CM ICD-9-CM   1. Dental abscess  K04.7 522.5       Follow-up Information       Follow up With Specialties Details Why Contact Info    Romeo Rabon, NP Nurse Practitioner  As needed [address] [place] [address]  [phone]      Dentist  Schedule an appointment as soon as possible for a visit  ASAP     [facility] Emergency Medicine  As needed, If symptoms worsen [id number] [address]  [place] [address]  [phone]            DISPOSITION:  Discharged

## 2021-04-25 NOTE — ED Notes (Signed)
ED triage note: ambulatory with a steady gait. Patient reports, right lower dental pain and swelling x 2 days. Denies fevers.

## 2021-04-26 MED ORDER — AMOXICILLIN CLAVULANATE 875 MG-125 MG TAB
875-125 mg | ORAL | Status: AC
Start: 2021-04-26 — End: 2021-04-25
  Administered 2021-04-26: 01:00:00 via ORAL

## 2021-04-26 MED ORDER — ACETAMINOPHEN 325 MG TABLET
325 mg | ORAL_TABLET | Freq: Four times a day (QID) | ORAL | 0 refills | Status: AC | PRN
Start: 2021-04-26 — End: ?

## 2021-04-26 MED ORDER — NAPROXEN 500 MG TAB
500 mg | ORAL_TABLET | Freq: Two times a day (BID) | ORAL | 0 refills | Status: AC
Start: 2021-04-26 — End: 2021-05-05

## 2021-04-26 MED ORDER — NAPROXEN 250 MG TAB
250 mg | ORAL | Status: AC
Start: 2021-04-26 — End: 2021-04-25
  Administered 2021-04-26: 01:00:00 via ORAL

## 2021-04-26 MED ORDER — BENZOCAINE 20 % MUCOSAL GEL
20 % | Freq: Four times a day (QID) | 0 refills | Status: AC | PRN
Start: 2021-04-26 — End: ?

## 2021-04-26 MED ORDER — AMOXICILLIN CLAVULANATE 875 MG-125 MG TAB
875-125 mg | ORAL_TABLET | Freq: Two times a day (BID) | ORAL | 0 refills | Status: AC
Start: 2021-04-26 — End: 2021-05-05

## 2021-04-26 MED ORDER — LIDOCAINE 2 % MUCOSAL SOLN
2 % | Freq: Once | Status: AC
Start: 2021-04-26 — End: 2021-04-25
  Administered 2021-04-26: 01:00:00

## 2021-04-26 MED ORDER — ACETAMINOPHEN 500 MG TAB
500 mg | ORAL | Status: AC
Start: 2021-04-26 — End: 2021-04-25
  Administered 2021-04-26: 01:00:00 via ORAL

## 2021-04-26 MED FILL — LIDOCAINE 2 % MUCOSAL SOLN: 2 % | Qty: 15

## 2021-04-26 MED FILL — ACETAMINOPHEN 500 MG TAB: 500 mg | ORAL | Qty: 2

## 2021-04-26 MED FILL — NAPROXEN 250 MG TAB: 250 mg | ORAL | Qty: 2

## 2021-04-26 MED FILL — AMOXICILLIN CLAVULANATE 875 MG-125 MG TAB: 875-125 mg | ORAL | Qty: 1

## 2021-12-05 ENCOUNTER — Inpatient Hospital Stay
Admit: 2021-12-05 | Discharge: 2021-12-05 | Disposition: A | Discharge status: Home / Self Care | Payer: PRIVATE HEALTH INSURANCE | Attending: Emergency Medicine

## 2021-12-05 DIAGNOSIS — R0789 Other chest pain: Secondary | ICD-10-CM

## 2021-12-05 DIAGNOSIS — R079 Chest pain, unspecified: Secondary | ICD-10-CM

## 2021-12-05 LAB — COMPREHENSIVE METABOLIC PANEL
ALT: 18 U/L (ref 12–78)
AST: 10 U/L — ABNORMAL LOW (ref 15–37)
Albumin/Globulin Ratio: 1 — ABNORMAL LOW (ref 1.1–2.2)
Albumin: 3.7 g/dL (ref 3.5–5.0)
Alk Phosphatase: 62 U/L (ref 45–117)
Anion Gap: 1 mmol/L — ABNORMAL LOW (ref 5–15)
BUN: 12 MG/DL (ref 6–20)
Bun/Cre Ratio: 11 — ABNORMAL LOW (ref 12–20)
CO2: 31 mmol/L (ref 21–32)
Calcium: 8.7 MG/DL (ref 8.5–10.1)
Chloride: 105 mmol/L (ref 97–108)
Creatinine: 1.05 MG/DL (ref 0.70–1.30)
Est, Glom Filt Rate: >60 mL/min/{1.73_m2} (ref >60)
Globulin: 3.8 g/dL (ref 2.0–4.0)
Glucose: 87 mg/dL (ref 65–100)
Potassium: 4.5 mmol/L (ref 3.5–5.1)
Sodium: 137 mmol/L (ref 136–145)
Total Bilirubin: 0.4 MG/DL (ref 0.2–1.0)
Total Protein: 7.5 g/dL (ref 6.4–8.2)

## 2021-12-05 LAB — CBC WITH AUTO DIFFERENTIAL
Absolute Immature Granulocyte: 0.1 10*3/uL — ABNORMAL HIGH (ref 0.00–0.04)
Basophils %: 0 % (ref 0–1)
Basophils Absolute: 0 10*3/uL (ref 0.0–0.1)
Eosinophils %: 0 % (ref 0–7)
Eosinophils Absolute: 0 10*3/uL (ref 0.0–0.4)
Hematocrit: 35.1 % — ABNORMAL LOW (ref 36.6–50.3)
Hemoglobin: 11.1 g/dL — ABNORMAL LOW (ref 12.1–17.0)
Immature Granulocytes: 1 % — ABNORMAL HIGH (ref 0.0–0.5)
Lymphocytes %: 25 % (ref 12–49)
Lymphocytes Absolute: 1.6 10*3/uL (ref 0.8–3.5)
MCH: 25.3 PG — ABNORMAL LOW (ref 26.0–34.0)
MCHC: 31.6 g/dL (ref 30.0–36.5)
MCV: 80.1 FL (ref 80.0–99.0)
MPV: 12.6 FL (ref 8.9–12.9)
Monocytes %: 7 % (ref 5–13)
Monocytes Absolute: 0.5 10*3/uL (ref 0.0–1.0)
Neutrophils %: 67 % (ref 32–75)
Neutrophils Absolute: 4.2 10*3/uL (ref 1.8–8.0)
Nucleated RBCs: 0 PER 100 WBC
Platelets: 189 10*3/uL (ref 150–400)
RBC: 4.38 M/uL (ref 4.10–5.70)
RDW: 14.9 % — ABNORMAL HIGH (ref 11.5–14.5)
WBC: 6.3 10*3/uL (ref 4.1–11.1)
nRBC: 0 10*3/uL (ref 0.00–0.01)

## 2021-12-05 LAB — TROPONIN: Troponin, High Sensitivity: 6 ng/L (ref 0–76)

## 2021-12-05 NOTE — ED Provider Notes (Signed)
EMERGENCY DEPARTMENT HISTORY AND PHYSICAL EXAM    Date: 12/05/2021  Patient Name: Connor Prince  Patient Age and Sex: 36 y.o. male  MRN:  [id number]  CSN:  [id number]    History of Present Illness     Chief Complaint   Patient presents with    Chest Pain     Pt arrives with EMS cc of chest pain since this am. Describes it as stabbing and central, denies SOB, N/V/D. Hx of seizures, denies cardiac hx.        History Provided By: Patient    Ability to gather history was limited by:     HPI: Connor Prince, 36 y.o. male   Complains of central chest pain since this morning.  No significant associated symptoms such as shortness of breath etc.  He has no known cardiac history, no coronary artery disease etc.  He does have a history of asthma, epilepsy.      Tobacco Use      Smoking status: Former        Packs/day: .25        Types: Cigarettes      Smokeless tobacco: Never      Tobacco comments: Quit smoking: approx 2-3     Past History   The patient's medical, surgical, and social history were reviewed by me today.    Current Medications:  No current facility-administered medications on file prior to encounter.     Current Outpatient Medications on File Prior to Encounter   Medication Sig Dispense Refill    acetaminophen (TYLENOL) 325 MG tablet Take 975 mg by mouth every 6 hours as needed      Benzocaine-Clove Oil 20 % GEL Apply 1 Dose topically 4 times daily as needed      divalproex (DEPAKOTE) 500 MG DR tablet Take 2,000 mg by mouth daily      levETIRAcetam (KEPPRA) 500 MG tablet Take 500 mg by mouth 2 times daily         Past Medical History:   Diagnosis Date    Asthma     Chest pain     Cough     EP (epilepsy) ([place])     Kidney stone     Migraine     Muscle pain     Seizures ([facility])     Skipped beats     SOB (shortness of breath)     Vitamin D deficiency 08/14/2016       No past surgical history on file.    Social History     Tobacco Use    Smoking status: Former     Packs/day: .25     Types: Cigarettes     Smokeless tobacco: Never    Tobacco comments:     Quit smoking: approx 2-3   Substance Use Topics    Alcohol use: Yes    Drug use: No       Allergies:  No Known Allergies  Review of Systems   A Review of Systems was reviewed by me today during this encounter.  Pertinent positive and negative elements are noted in the HPI and MDM sections.    Review of Systems   Respiratory:  Negative for shortness of breath.    Cardiovascular:  Positive for chest pain. Negative for palpitations and leg swelling.   All other systems reviewed and are negative.      Physical Exam   Vital Signs  No data found.       Physical Exam  Vitals reviewed.   Constitutional:       General: He is not in acute distress.     Appearance: Normal appearance. He is not ill-appearing.   Cardiovascular:      Rate and Rhythm: Normal rate and regular rhythm.   Pulmonary:      Effort: Pulmonary effort is normal. No respiratory distress.      Breath sounds: No wheezing or rhonchi.   Abdominal:      General: Abdomen is flat. There is no distension.      Palpations: Abdomen is soft.      Tenderness: There is no abdominal tenderness.   Skin:     General: Skin is warm and dry.   Neurological:      General: No focal deficit present.      Mental Status: He is alert and oriented to person, place, and time.         Diagnostic Study Results   Labs  No results found for this or any previous visit (from the past 24 hour(s)).    ==============================================================    Radiologic Studies  No orders to display          Critical Care and Billable Procedures   EKG reviewed by ED Physician Robb Matar in the absence of a cardiologist: Yes  EKG below was independently interpreted by me Albesa Seen, MD)    EKG 12 Lead    Date/Time: 12/17/2021 8:35 AM    Performed by: Albesa Seen, MD  Authorized by: Albesa Seen, MD    ECG reviewed by ED Physician in the absence of a cardiologist: yes    Interpretation:     Interpretation: non-specific     Rate:     ECG rate assessment: normal    Rhythm:     Rhythm: sinus rhythm    Ectopy:     Ectopy: none        Medical Decision Making   I reviewed the patient's most recent Emergency Dept notes and diagnostic tests in formulating my MDM on today's visit.    Medications administered during ED course:  Medications - No data to display  Medical Decision Making // ED Course // Reassessment:  [name], 36 y.o. male Complains of central chest pain since this morning.  No significant associated symptoms such as shortness of breath etc.  He has no known cardiac history, no coronary artery disease etc.  He does have a history of asthma, epilepsy.    Well-appearing, no apparent distress or discomfort.  Normal EKG.    Unremarkable laboratories and troponin.  Heart score 0.        Given the time course and  nature of his complaints, which seem very atypical for coronary artery disease, I elected to defer serial troponins and patient can be safely discharged.      Final Diagnosis:   1. Chest pain, unspecified type        Additional documentation if relevant for this encounter     HEART score if relevant:  0    Diagnosis and Disposition     Disposition: Decision To Discharge 12/05/2021 02:37:08 PM     Final Diagnosis:   1. Chest pain, unspecified type           Medication List        ASK your doctor about these medications      acetaminophen 325 MG tablet  Commonly known as: TYLENOL     Benzocaine-Clove Oil 20 % Gel  divalproex 500 MG DR tablet  Commonly known as: DEPAKOTE     levETIRAcetam 500 MG tablet  Commonly known as: KEPPRA               Follow up:  Romeo Rabon, APRN - NP  [address] [place] [address]  [phone]          [facility]  [address]  [place] [address]  [phone]           Disclaimers   I was the first provider for this patient on this visit.  To the best of my ability I reviewed relevant prior medical records, electrocardiograms, laboratories, and  radiologic studies.    The patient's presenting problems were discussed, and the patient was in agreement with the care plan formulated and outlined with them.     Please note that this dictation was completed with Dragon voice recognition software. Quite often unanticipated grammatical, syntax, homophones, and other interpretive errors are inadvertently transcribed by the computer software.   Please disregard these errors and excuse any errors that have escaped final proofreading.    Albesa Seen, MD    I personally performed the services described in this documentation on this date 12/17/21  for patient Anthonio Mizzell.      Albesa Seen, MD  8:34 AM            Albesa Seen, MD  12/17/21 [phone]

## 2021-12-05 NOTE — ED Notes (Signed)
I have reviewed discharge instructions with the patient at this time. The Patient verbalized understanding and denies any further questions. Patient ambulatory out to waiting room at this time.        Eliane Decree, RN  12/05/21 1450

## 2021-12-17 LAB — EKG 12-LEAD
Atrial Rate: 80 {beats}/min
P Axis: 29 degrees
P-R Interval: 126 ms
Q-T Interval: 356 ms
QRS Duration: 82 ms
QTc Calculation (Bazett): 410 ms
R Axis: 19 degrees
T Axis: 30 degrees
Ventricular Rate: 80 {beats}/min

## 2022-01-15 NOTE — Telephone Encounter (Signed)
Formatting of this note might be different from the original.  Last office visit 07/17/2019  Front desk please call patient to schedule office visit to pcp. thanks  Electronically signed by Randye Lobo, MA at 01/15/2022  5:03 PM EST

## 2022-02-15 NOTE — Telephone Encounter (Signed)
Formatting of this note might be different from the original.  Last office visit 07/17/2019  Front desk please call patient to schedule office visit to see pcp. thanks  Electronically signed by Randye Lobo, MA at 02/15/2022  4:42 PM EST

## 2022-02-16 NOTE — Telephone Encounter (Signed)
Formatting of this note might be different from the original.  LOV: 07/17/2019  Last prescribed: 01/25/2022    Pt called and message left asking patient to make appointment with PCP for follow up.  Electronically signed by Flossie Dibble, RN at 02/16/2022  3:17 PM EST

## 2022-02-16 NOTE — Telephone Encounter (Signed)
Formatting of this note might be different from the original.  1st attempt, left vm. Needs annual.  Electronically signed by Tonia Ghent at 02/16/2022  8:04 AM EST

## 2023-04-12 DIAGNOSIS — R072 Precordial pain: Secondary | ICD-10-CM

## 2023-04-12 DIAGNOSIS — R079 Chest pain, unspecified: Secondary | ICD-10-CM

## 2023-04-12 NOTE — ED Provider Notes (Signed)
 [facility]  EMERGENCY DEPARTMENT ENCOUNTER       Pt Name: Connor Prince  MRN: [id number]  Birthdate 01/17/1986  Date of evaluation: 04/12/2023  Provider: Evette Doffing, DO   PCP: No primary care provider on file.  Note Started: 2:32 AM EST 04/13/23     CHIEF COMPLAINT       Chief Complaint   Patient presents with    Chest Pain     Patient arrived via rescue RT [address] with complaint of chest pain that woke him up from his sleep less than one hour ago.  He denies any previous cardiac history.        HISTORY OF PRESENT ILLNESS: 1 or more elements      History From: patient, History limited by: none     Connor Prince is a 39 y.o. male presents to the emergency department for evaluation of chest pain.       Please See MDM for Additional Details of the HPI/PMH  Nursing Notes were all reviewed and agreed with or any disagreements were addressed in the HPI.     REVIEW OF SYSTEMS        Positives and Pertinent negatives as per HPI.    PAST HISTORY     Past Medical History:  Past Medical History:   Diagnosis Date    Asthma     Chest pain     Cough     EP (epilepsy) ([facility])     Kidney stone     Migraine     Muscle pain     Seizures ([facility])     Skipped beats     SOB (shortness of breath)     Vitamin D deficiency 08/14/2016       Past Surgical History:  No past surgical history on file.    Family History:  Family History   Problem Relation Age of Onset    Seizures Mother     No Known Problems Father     Migraines Mother     Cancer Mother         stomach       Social History:  Social History     Tobacco Use    Smoking status: Former     Current packs/day: 0.25     Types: Cigarettes    Smokeless tobacco: Never    Tobacco comments:     Quit smoking: approx 2-3   Substance Use Topics    Alcohol use: Yes    Drug use: No       Allergies:  No Known Allergies    CURRENT MEDICATIONS      Previous Medications    ACETAMINOPHEN (TYLENOL) 325 MG TABLET    Take 975 mg by mouth every 6 hours as needed     BENZOCAINE-CLOVE OIL 20 % GEL    Apply 1 Dose topically 4 times daily as needed    DIVALPROEX (DEPAKOTE) 500 MG DR TABLET    Take 2,000 mg by mouth daily    LEVETIRACETAM (KEPPRA) 500 MG TABLET    Take 500 mg by mouth 2 times daily       SCREENINGS               No data recorded            PHYSICAL EXAM      ED Triage Vitals   Encounter Vitals Group      BP 04/12/23 2344 111/65      Systolic  BP Percentile --       Diastolic BP Percentile --       Pulse 04/12/23 2344 91      Respirations 04/12/23 2344 18      Temp 04/12/23 2344 98.6 F (37 C)      Temp Source 04/12/23 2344 Oral      SpO2 04/12/23 2344 97 %      Weight --       Height 04/12/23 2348 1.99 m (6' 6.35")      Head Circumference --       Peak Flow --       Pain Score --       Pain Loc --       Pain Education --       Exclude from Growth Chart --         Physical Exam  Vitals and nursing note reviewed.   Constitutional:       General: He is not in acute distress.     Appearance: He is well-developed. He is not ill-appearing.   HENT:      Head: Normocephalic and atraumatic.      Mouth/Throat:      Mouth: Mucous membranes are moist.   Eyes:      General: No scleral icterus.     Extraocular Movements: Extraocular movements intact.      Pupils: Pupils are equal, round, and reactive to light.   Cardiovascular:      Rate and Rhythm: Normal rate and regular rhythm.   Pulmonary:      Effort: Pulmonary effort is normal. No respiratory distress.      Breath sounds: Normal breath sounds. No wheezing, rhonchi or rales.   Chest:      Chest wall: No tenderness.   Abdominal:      General: There is no distension.      Palpations: Abdomen is soft.      Tenderness: There is no abdominal tenderness.   Musculoskeletal:         General: Normal range of motion.      Right lower leg: No edema.      Left lower leg: No edema.   Skin:     General: Skin is warm and dry.      Capillary Refill: Capillary refill takes less than 2 seconds.   Neurological:      Mental Status: He is alert  and oriented to person, place, and time.      Cranial Nerves: No cranial nerve deficit.   Psychiatric:         Mood and Affect: Mood normal.         Behavior: Behavior normal.          DIAGNOSTIC RESULTS   LABS:     Recent Results (from the past 24 hour(s))   CBC with Auto Differential    Collection Time: 04/13/23 12:22 AM   Result Value Ref Range    WBC 9.5 4.1 - 11.1 K/uL    RBC 4.91 4.10 - 5.70 M/uL    Hemoglobin 11.8 (L) 12.1 - 17.0 g/dL    Hematocrit 16.1 (L) 36.6 - 50.3 %    MCV 73.3 (L) 80.0 - 99.0 FL    MCH 24.0 (L) 26.0 - 34.0 PG    MCHC 32.8 30.0 - 36.5 g/dL    RDW 09.6 04.5 - 40.9 %    Platelets 298 150 - 400 K/uL    MPV 12.4 8.9 - 12.9 FL  Nucleated RBCs 0.0 0 PER 100 WBC    nRBC 0.00 0.00 - 0.01 K/uL    Neutrophils % 63.7 32.0 - 75.0 %    Lymphocytes % 24.9 12.0 - 49.0 %    Monocytes % 8.0 5.0 - 13.0 %    Eosinophils % 2.6 0.0 - 7.0 %    Basophils % 0.3 0.0 - 1.0 %    Immature Granulocytes % 0.5 0.0 - 0.5 %    Neutrophils Absolute 6.04 1.80 - 8.00 K/UL    Lymphocytes Absolute 2.37 0.80 - 3.50 K/UL    Monocytes Absolute 0.76 0.00 - 1.00 K/UL    Eosinophils Absolute 0.25 0.00 - 0.40 K/UL    Basophils Absolute 0.03 0.00 - 0.10 K/UL    Immature Granulocytes Absolute 0.05 (H) 0.00 - 0.04 K/UL    Differential Type AUTOMATED     Comprehensive Metabolic Panel    Collection Time: 04/13/23 12:22 AM   Result Value Ref Range    Sodium 139 136 - 145 mmol/L    Potassium 3.5 3.5 - 5.1 mmol/L    Chloride 112 (H) 97 - 108 mmol/L    CO2 25 21 - 32 mmol/L    Anion Gap 2 2 - 12 mmol/L    Glucose 73 65 - 100 mg/dL    BUN 10 6 - 20 MG/DL    Creatinine 9.60 4.54 - 1.30 MG/DL    BUN/Creatinine Ratio 8 (L) 12 - 20      Est, Glom Filt Rate 80 >60 ml/min/1.19m2    Calcium 9.1 8.5 - 10.1 MG/DL    Total Bilirubin 0.3 0.2 - 1.0 MG/DL    ALT 18 12 - 78 U/L    AST 17 15 - 37 U/L    Alk Phosphatase 68 45 - 117 U/L    Total Protein 7.5 6.4 - 8.2 g/dL    Albumin 3.6 3.5 - 5.0 g/dL    Globulin 3.9 2.0 - 4.0 g/dL    Albumin/Globulin  Ratio 0.9 (L) 1.1 - 2.2     Troponin    Collection Time: 04/13/23 12:22 AM   Result Value Ref Range    Troponin, High Sensitivity 4 0 - 76 ng/L       EKG: If performed, independent interpretation documented below in the MDM section     RADIOLOGY:  Non-plain film images such as CT, Ultrasound and MRI are read by the radiologist. Plain radiographic images are visualized and preliminarily interpreted by the ED Provider with the findings documented in the MDM section.     Interpretation per the Radiologist below, if available at the time of this note:     XR CHEST PORTABLE   Final Result   No acute cardiopulmonary abnormalities.         Electronically signed by Grier Rocher           PROCEDURES   Unless otherwise noted below, none  Procedures     CRITICAL CARE TIME   None    EMERGENCY DEPARTMENT COURSE and DIFFERENTIAL DIAGNOSIS/MDM   Vitals:    Vitals:    04/12/23 2344 04/12/23 2348 04/13/23 0129   BP: 111/65  99/63   Pulse: 91  85   Resp: 18     Temp: 98.6 F (37 C)     TempSrc: Oral     SpO2: 97%  97%   Height:  1.99 m (6' 6.35")         Patient was given the following medications:  Medications  mylanta/viscous lidocaine (GI COCKTAIL) (40 mLs Oral Given 04/13/23 0030)       Medical Decision Making  Amount and/or Complexity of Data Reviewed  Labs: ordered.  Radiology: ordered.  ECG/medicine tests: ordered.    Risk  Prescription drug management.      Patient presents to the emergency department for evaluation of chest pain.  Patient states onset of pain was approximately 60 minutes prior to arrival to the hospital.  Patient states the pain woke him up out of his sleep.  He states the pain is located substernal.  He describes it as a tightness.  The pain does not radiate.  Shortness of breath.  He is any diaphoresis.  Patient with no prior cardiac history.  Patient states he does have some issues with indigestion from time to time.  He denies any pain or swelling lower extremities.  Patient states his pain is moderate  severity currently.    Will obtain blood work to include CBC CMP troponin EKG and chest x-ray.  Patient be given a GI cocktail.       EKG obtained at 12:03 AM interpreted by me normal sinus rhythm, rate 93, normal axis/PR/QRS, J-point elevation secondary to early repol, no acute ST changes.     Heart Score: 1    Following GI cocktail patient's pain resolved.  Initial troponin unremarkable will get a repeat.    Repeat troponin unremarkable.  Patiently discharged home  FINAL IMPRESSION     1. Chest pain, unspecified type          DISPOSITION/PLAN   Cristal Deer Ankney's  results have been reviewed with him.  He has been counseled regarding his diagnosis, treatment, and plan.  He verbally conveys understanding and agreement of the signs, symptoms, diagnosis, treatment and prognosis and additionally agrees to follow up as discussed.  He also agrees with the care-plan and conveys that all of his questions have been answered.  I have also provided discharge instructions for him that include: educational information regarding their diagnosis and treatment, and list of reasons why they would want to return to the ED prior to their follow-up appointment, should his condition change.     CLINICAL IMPRESSION    Discharge Note: The patient is stable for discharge home. The signs, symptoms, diagnosis, and discharge instructions have been discussed, understanding conveyed, and agreed upon. The patient is to follow up as recommended or return to ER should their symptoms worsen.      PATIENT REFERRED TO:  No follow-up provider specified.     DISCHARGE MEDICATIONS:     Medication List        START taking these medications      omeprazole 40 MG delayed release capsule  Commonly known as: PRILOSEC  Take 1 capsule by mouth every morning (before breakfast)            ASK your doctor about these medications      acetaminophen 325 MG tablet  Commonly known as: TYLENOL     Benzocaine-Clove Oil 20 % Gel     divalproex 500 MG DR  tablet  Commonly known as: DEPAKOTE     levETIRAcetam 500 MG tablet  Commonly known as: KEPPRA               Where to Get Your Medications        Information about where to get these medications is not yet available    Ask your nurse or doctor about these medications  omeprazole 40  MG delayed release capsule           DISCONTINUED MEDICATIONS:  Current Discharge Medication List          I am the Primary Clinician of Record.   Evette Doffing, DO (electronically signed)    (Please note that parts of this dictation were completed with voice recognition software. Quite often unanticipated grammatical, syntax, homophones, and other interpretive errors are inadvertently transcribed by the computer software. Please disregards these errors. Please excuse any errors that have escaped final proofreading.)          Tonie Griffith, DO  04/13/23 2019

## 2023-04-13 ENCOUNTER — Inpatient Hospital Stay
Admit: 2023-04-13 | Discharge: 2023-04-13 | Disposition: A | Discharge status: Home / Self Care | Payer: PRIVATE HEALTH INSURANCE | Attending: Emergency Medicine

## 2023-04-13 ENCOUNTER — Emergency Department: Admit: 2023-04-13 | Payer: PRIVATE HEALTH INSURANCE

## 2023-04-13 LAB — COMPREHENSIVE METABOLIC PANEL
ALT: 18 U/L (ref 12–78)
AST: 17 U/L (ref 15–37)
Albumin/Globulin Ratio: 0.9 — ABNORMAL LOW (ref 1.1–2.2)
Albumin: 3.6 g/dL (ref 3.5–5.0)
Alk Phosphatase: 68 U/L (ref 45–117)
Anion Gap: 2 mmol/L (ref 2–12)
BUN/Creatinine Ratio: 8 — ABNORMAL LOW (ref 12–20)
BUN: 10 mg/dL (ref 6–20)
CO2: 25 mmol/L (ref 21–32)
Calcium: 9.1 mg/dL (ref 8.5–10.1)
Chloride: 112 mmol/L — ABNORMAL HIGH (ref 97–108)
Creatinine: 1.21 mg/dL (ref 0.70–1.30)
Est, Glom Filt Rate: 80 mL/min/{1.73_m2} (ref >60)
Globulin: 3.9 g/dL (ref 2.0–4.0)
Glucose: 73 mg/dL (ref 65–100)
Potassium: 3.5 mmol/L (ref 3.5–5.1)
Sodium: 139 mmol/L (ref 136–145)
Total Bilirubin: 0.3 mg/dL (ref 0.2–1.0)
Total Protein: 7.5 g/dL (ref 6.4–8.2)

## 2023-04-13 LAB — CBC WITH AUTO DIFFERENTIAL
Basophils %: 0.3 % (ref 0.0–1.0)
Basophils Absolute: 0.03 10*3/uL (ref 0.00–0.10)
Eosinophils %: 2.6 % (ref 0.0–7.0)
Eosinophils Absolute: 0.25 10*3/uL (ref 0.00–0.40)
Hematocrit: 36 % — ABNORMAL LOW (ref 36.6–50.3)
Hemoglobin: 11.8 g/dL — ABNORMAL LOW (ref 12.1–17.0)
Immature Granulocytes %: 0.5 % (ref 0.0–0.5)
Immature Granulocytes Absolute: 0.05 10*3/uL — ABNORMAL HIGH (ref 0.00–0.04)
Lymphocytes %: 24.9 % (ref 12.0–49.0)
Lymphocytes Absolute: 2.37 10*3/uL (ref 0.80–3.50)
MCH: 24 pg — ABNORMAL LOW (ref 26.0–34.0)
MCHC: 32.8 g/dL (ref 30.0–36.5)
MCV: 73.3 fL — ABNORMAL LOW (ref 80.0–99.0)
MPV: 12.4 fL (ref 8.9–12.9)
Monocytes %: 8 % (ref 5.0–13.0)
Monocytes Absolute: 0.76 10*3/uL (ref 0.00–1.00)
Neutrophils %: 63.7 % (ref 32.0–75.0)
Neutrophils Absolute: 6.04 10*3/uL (ref 1.80–8.00)
Nucleated RBCs: 0 /100{WBCs}
Platelets: 298 10*3/uL (ref 150–400)
RBC: 4.91 M/uL (ref 4.10–5.70)
RDW: 13.2 % (ref 11.5–14.5)
WBC: 9.5 10*3/uL (ref 4.1–11.1)
nRBC: 0 10*3/uL (ref 0.00–0.01)

## 2023-04-13 LAB — TROPONIN
Troponin, High Sensitivity: 4 ng/L (ref 0–76)
Troponin, High Sensitivity: 4 ng/L (ref 0–76)

## 2023-04-13 MED ORDER — OMEPRAZOLE 40 MG PO CPDR
40 | ORAL_CAPSULE | Freq: Every day | ORAL | 0 refills | Status: AC
Start: 2023-04-13 — End: ?

## 2023-04-13 MED ORDER — ALUM & MAG HYDROXIDE-SIMETH 200-200-20 MG/5ML PO SUSP
200-200-20 | Freq: Once | ORAL | Status: AC
Start: 2023-04-13 — End: 2023-04-13
  Administered 2023-04-13: 06:00:00 40 mL via ORAL

## 2023-04-13 MED FILL — MAG-AL PLUS 200-200-20 MG/5ML PO LIQD: 200-200-205 MG/5ML | ORAL | Qty: 30

## 2023-04-13 NOTE — ED Notes (Signed)
 Patient is discharged to home at this time. He is awake, alert, and oriented.  Respirations are normal with no current sign of distress.  RN reviewed discharge instructions with the patient, and he verbalized understanding. All of the patients questions and concerns were addressed. Patient given discharge documents and prescription in hand.  He is stable and appears comfortable in ED bed waiting for medical transport.

## 2023-04-14 LAB — EKG 12-LEAD
Atrial Rate: 93 {beats}/min
Diagnosis: NORMAL
P Axis: 59 degrees
P-R Interval: 134 ms
Q-T Interval: 350 ms
QRS Duration: 92 ms
QTc Calculation (Bazett): 435 ms
R Axis: 58 degrees
T Axis: 64 degrees
Ventricular Rate: 93 {beats}/min

## 2023-05-21 DIAGNOSIS — R079 Chest pain, unspecified: Secondary | ICD-10-CM

## 2023-05-21 NOTE — Discharge Instructions (Signed)
 Thank You!    It was a pleasure taking care of you in our Emergency Department today. We know that when you come to our Emergency Department, you are entrusting [place] with your health, comfort, and safety. Our physicians and nurses honor that trust, and truly appreciate the opportunity to care for you and your loved ones.      We also value your feedback. If you receive a survey about your Emergency Department experience today, please fill it out.  We care about our patients' feedback, and we listen to what you have to say.  Thank you.    Birder Robson, MD  ________________________________________________________________________  I have included a copy of your lab results and/or radiologic studies from today's visit so you can have them easily available at your follow-up visit. We hope you feel better and please do not hesitate to contact the ED if you have any questions at all!    Recent Results (from the past 12 hours)   EKG 12 Lead    Collection Time: 05/21/23  9:49 PM   Result Value Ref Range    Ventricular Rate 70 BPM    Atrial Rate 70 BPM    P-R Interval 144 ms    QRS Duration 90 ms    Q-T Interval 400 ms    QTc Calculation (Bazett) 432 ms    P Axis 51 degrees    R Axis 40 degrees    T Axis 52 degrees    Diagnosis       Sinus rhythm with occasional premature ventricular complexes  Minimal voltage criteria for LVH, may be normal variant ( Sokolow-Lyon )  Early repolarization  Borderline ECG  When compared with ECG of 13-Apr-2023 00:03,  premature ventricular complexes are now present     CBC with Auto Differential    Collection Time: 05/21/23 10:26 PM   Result Value Ref Range    WBC 6.2 4.1 - 11.1 K/uL    RBC 4.75 4.10 - 5.70 M/uL    Hemoglobin 11.3 (L) 12.1 - 17.0 g/dL    Hematocrit 16.1 (L) 36.6 - 50.3 %    MCV 76.2 (L) 80.0 - 99.0 FL    MCH 23.8 (L) 26.0 - 34.0 PG    MCHC 31.2 30.0 - 36.5 g/dL    RDW 09.6 04.5 - 40.9 %    Platelets 266 150 - 400 K/uL    MPV 11.9 8.9 - 12.9 FL    Nucleated RBCs 0.0 0 PER 100 WBC     nRBC 0.00 0.00 - 0.01 K/uL    Neutrophils % 61.6 32.0 - 75.0 %    Lymphocytes % 32.6 12.0 - 49.0 %    Monocytes % 5.0 5.0 - 13.0 %    Eosinophils % 0.2 0.0 - 7.0 %    Basophils % 0.3 0.0 - 1.0 %    Immature Granulocytes % 0.3 0.0 - 0.5 %    Neutrophils Absolute 3.79 1.80 - 8.00 K/UL    Lymphocytes Absolute 2.01 0.80 - 3.50 K/UL    Monocytes Absolute 0.31 0.00 - 1.00 K/UL    Eosinophils Absolute 0.01 0.00 - 0.40 K/UL    Basophils Absolute 0.02 0.00 - 0.10 K/UL    Immature Granulocytes Absolute 0.02 0.00 - 0.04 K/UL    Differential Type AUTOMATED     Comprehensive Metabolic Panel    Collection Time: 05/21/23 10:26 PM   Result Value Ref Range    Sodium 137 136 - 145 mmol/L  Potassium 4.3 3.5 - 5.1 mmol/L    Chloride 104 97 - 108 mmol/L    CO2 29 21 - 32 mmol/L    Anion Gap 4 2 - 12 mmol/L    Glucose 102 (H) 65 - 100 mg/dL    BUN 10 6 - 20 MG/DL    Creatinine 1.61 0.96 - 1.30 MG/DL    BUN/Creatinine Ratio 11 (L) 12 - 20      Est, Glom Filt Rate >90 >60 ml/min/1.64m2    Calcium 9.2 8.5 - 10.1 MG/DL    Total Bilirubin 0.7 0.2 - 1.0 MG/DL    ALT 19 12 - 78 U/L    AST 16 15 - 37 U/L    Alk Phosphatase 80 45 - 117 U/L    Total Protein 7.5 6.4 - 8.2 g/dL    Albumin 4.0 3.5 - 5.0 g/dL    Globulin 3.5 2.0 - 4.0 g/dL    Albumin/Globulin Ratio 1.1 1.1 - 2.2     Troponin    Collection Time: 05/21/23 10:26 PM   Result Value Ref Range    Troponin, High Sensitivity <4 0 - 76 ng/L     No orders to display       The exam and treatment you received in the Emergency Department were for an urgent problem and are not intended as complete care. It is important that you follow up with a doctor, nurse practitioner, or physician assistant for ongoing care. If your symptoms become worse or you do not improve as expected and you are unable to reach your usual health care provider, you should return to the Emergency Department. We are available 24 hours a day.    Please take your discharge instructions with you when you go to your follow-up  appointment.     If a prescription has been provided, please have it filled as soon as possible to prevent a delay in treatment. Read the entire medication instruction sheet provided to you by the pharmacy. If you have any questions or reservations about taking the medication due to side effects or interactions with other medications, please call your primary care physician or contact the ER to speak with the charge nurse.     Please make an appointment with your family doctor or the physician you were referred to for follow-up of this visit as instructed on your discharge paperwork. Return to the ER if you are unable to be seen or if you are unable to be seen in a timely manner.    Should you experience abdominal pain lasting greater than 6 hours, chest pain, difficulty breathing, fever/chills, numbness/tingling, skin changes or other symptoms that concern you, return to the ED sooner. If you feel worse over the next 24 hours, please return to the ED. We are available 24 hours a day. Thank you for trusting [place] with your care!

## 2023-05-21 NOTE — ED Provider Notes (Addendum)
 [facility]  EMERGENCY DEPARTMENT ENCOUNTER         Pt Name: Connor Prince  MRN: [id number]  Birthdate [birth year]  Date of evaluation: 05/21/2023  Provider: Birder Robson, MD   PCP: No primary care provider on file.  Note Started: 11:30 PM 05/21/23     CHIEF COMPLAINT       Chief Complaint   Patient presents with    Chest Pain     Pt arrives from home via EMS for right-sided chest pain since 1200. Denies any meds PTA nor any cardiac hx.         HISTORY OF PRESENT ILLNESS: 1 or more elements      History From: Patient  HPI Limitations: None     Connor Prince is a 38 y.o. male whose medical history is listed below presents to the ED with right sided, sharp, non-radiating chest pain that started around noon today and has been constant since. He cannot recall any trigger or event that could have caused the pain. Denies history of heart disease. Denies any associated symptoms including no sob, le edema, orthopnea, PND, lightheadedness, palpitations, syncope. Denies having exertional cp or pleuritic cp.   No fever, chills, cough or recent illness.  Patient reports having similar acute chest pain episodes in the past, has been to the ED for evaluation and always had a negative workup.   No other symptoms or concerns and ROS is otherwise negative.   Pt denies any other exacerbating or ameliorating factors. There are no other complaints, changes or physical findings pertinent to the HPI at this time.       PAST HISTORY     Past Medical History:  Past Medical History:   Diagnosis Date    Asthma     Chest pain     Cough     EP (epilepsy) (HCC)     Kidney stone     Migraine     Muscle pain     Seizures (HCC)     Skipped beats     SOB (shortness of breath)     Vitamin D deficiency 08/14/2016       Past Surgical History:  No past surgical history on file.    Family History:  Family History   Problem Relation Age of Onset    Seizures Mother     No Known Problems Father     Migraines Mother      Cancer Mother         stomach       Social History:  Social History     Tobacco Use    Smoking status: Former     Current packs/day: 0.25     Types: Cigarettes    Smokeless tobacco: Never    Tobacco comments:     Quit smoking: approx 2-3   Substance Use Topics    Alcohol use: Yes    Drug use: No       Allergies:  No Known Allergies      MEDICAL RECORDS REVIEW      I reviewed and interpreted the nursing notes and and vital signs from today's visit, as well as the electronic medical record system for any external medical records that were available that may contribute to the patients current condition.  This includes my independent reviewed and interpreted the following medical records:      CURRENT MEDICATIONS      Previous Medications    ACETAMINOPHEN (TYLENOL) 325 MG  TABLET    Take 975 mg by mouth every 6 hours as needed    BENZOCAINE-CLOVE OIL 20 % GEL    Apply 1 Dose topically 4 times daily as needed    DIVALPROEX (DEPAKOTE) 500 MG DR TABLET    Take 2,000 mg by mouth daily    LEVETIRACETAM (KEPPRA) 500 MG TABLET    Take 500 mg by mouth 2 times daily    OMEPRAZOLE (PRILOSEC) 40 MG DELAYED RELEASE CAPSULE    Take 1 capsule by mouth every morning (before breakfast)       REVIEW OF SYSTEMS    Review of Systems   Constitutional:  Negative for fever.   Eyes:  Negative for visual disturbance.   Respiratory:  Negative for cough and shortness of breath.    Cardiovascular:  Positive for chest pain. Negative for palpitations and leg swelling.   Gastrointestinal:  Negative for abdominal pain, nausea and vomiting.   Genitourinary:  Negative for dysuria, flank pain and hematuria.   Musculoskeletal:  Negative for arthralgias and back pain.   Skin:  Negative for color change.   Neurological:  Negative for syncope.   Psychiatric/Behavioral:  Negative for confusion.         Positives and Pertinent negatives as per HPI and MDM.  PHYSICAL EXAM      ED Triage Vitals [05/21/23 2154]   Encounter Vitals Group      BP 130/88       Systolic BP Percentile       Diastolic BP Percentile       Pulse 64      Respirations 16      Temp 98.4 F (36.9 C)      Temp Source Oral      SpO2 97 %      Weight       Height       Head Circumference       Peak Flow       Pain Score       Pain Loc       Pain Education       Exclude from Growth Chart        Physical Exam  Vitals and nursing note reviewed.   HENT:      Head: Atraumatic.      Nose: Nose normal.      Mouth/Throat:      Mouth: Mucous membranes are moist.   Eyes:      Extraocular Movements: Extraocular movements intact.      Conjunctiva/sclera: Conjunctivae normal.   Cardiovascular:      Rate and Rhythm: Normal rate and regular rhythm.      Pulses: Normal pulses.      Heart sounds: Normal heart sounds.   Pulmonary:      Effort: Pulmonary effort is normal.      Breath sounds: Normal breath sounds.   Chest:      Chest wall: No tenderness.   Abdominal:      General: Bowel sounds are normal. There is no distension.      Palpations: Abdomen is soft.      Tenderness: There is no abdominal tenderness. There is no right CVA tenderness or left CVA tenderness.   Musculoskeletal:         General: No tenderness. Normal range of motion.      Cervical back: Normal range of motion and neck supple.      Right lower leg: No edema.  Left lower leg: No edema.   Skin:     General: Skin is warm and dry.      Capillary Refill: Capillary refill takes less than 2 seconds.   Neurological:      General: No focal deficit present.      Mental Status: He is alert and oriented to person, place, and time. Mental status is at baseline.         MEDICAL DECISION MAKING   I am the first and primary ED physician for this patient's ED visit today. Initial assessment performed. The patients presenting problems have been discussed, and they are in agreement with the care plan formulated and outlined with them.  I have encouraged them to ask questions as they arise throughout their visit.      ED Vitals:    Vitals:    05/21/23 2154    BP: 130/88   Pulse: 64   Resp: 16   Temp: 98.4 F (36.9 C)   TempSrc: Oral   SpO2: 97%       Cardiac and pulse ox monitor interpreted by me.  The cardiac monitor revealed normal sinus rhythm. Pulse ox with good pleth showed oxygenation on RA over 92%.   The cardiac and pulse ox monitor was ordered to monitor patient for signs of cardiac dysrhythmia or acute hypoxemia due to concern for possible dysrhythmia and/or hypoxemia based on patient's presentation, risk factors, underlying chronic comorbid conditions and/or metabolic abnormalities.     ECG interpretation by ED physician in the absence of a cardiologist:   Rhythm: sinus; and regular. Rate: 70; occasional PVCs, early repol with jpoint elevation, normal axis and normal qrs, pr and qt intervals.ST/T wave: no st elevations or reciprocal depressions.  No definitive evidence of acute ischemia.  This and prior available ECGs have been viewed and interpreted by me personally.        ASSESSMENT, DDX and PLAN    In the ED patient is hemodynamically stable, afebrile, non-toxic appearance and is in no acute distress.   Pt presents with recurrent chest pain; hemodynamically stable, afebrile, overall well-appearing and with a benign exam. No prior cardiac history. Pain  not associated with any other symptom.   DDx includes acs, unstable angina, PE, Aortic Pathology, Chest Wall Pain, Pleurisy, Pneumonia, GERD/esophagitis, Anxiety.    No cough/fever or focal lung findings to suggest pneumonia.  No tachycardia, hypoxia or pleuritic component to suggest PE.   Pulses symmetric and no extremely elevated BP/asymmetry or classic 'tearing' sensation to suggest Aortic Dissection. No wretching/forceful vomiting to suggest esophageal tear.  Denies IV drug abuse, no fevers/murmurs or skin lesions to suggest endocarditis.           Obtained ECG, cbc, cmp and troponin. No respiratory symptoms, lungs clear on exam, normal o2 sat and rr on RA therefore cxr not indicated.     I personally  reviewed and interpreted the patient's available laboratory and imaging results. Pertinent findings demonstrate:    Negative troponin. Non ischemic ECG. Heart score is 1. Patient on reassessment states that pain is minimal to none. Given timeline since pain onset, no need to trend troponins, ACS very unlikely and can be excluded.  Cbc shows hegb 11.3, his baseline. Otherwise normal. CMP normal.    Pain most likely musculoskeletal or possibly due to dyspepsia. No worrisome findings on workup today and patient is stable for dc.      DIAGNOSTIC RESULTS     Laboratory Results:    Recent Results (  from the past 24 hours)   EKG 12 Lead    Collection Time: 05/21/23  9:49 PM   Result Value Ref Range    Ventricular Rate 70 BPM    Atrial Rate 70 BPM    P-R Interval 144 ms    QRS Duration 90 ms    Q-T Interval 400 ms    QTc Calculation (Bazett) 432 ms    P Axis 51 degrees    R Axis 40 degrees    T Axis 52 degrees    Diagnosis       Sinus rhythm with occasional premature ventricular complexes  Minimal voltage criteria for LVH, may be normal variant ( Sokolow-Lyon )  Early repolarization  Borderline ECG  When compared with ECG of 13-Apr-2023 00:03,  premature ventricular complexes are now present     CBC with Auto Differential    Collection Time: 05/21/23 10:26 PM   Result Value Ref Range    WBC 6.2 4.1 - 11.1 K/uL    RBC 4.75 4.10 - 5.70 M/uL    Hemoglobin 11.3 (L) 12.1 - 17.0 g/dL    Hematocrit 16.1 (L) 36.6 - 50.3 %    MCV 76.2 (L) 80.0 - 99.0 FL    MCH 23.8 (L) 26.0 - 34.0 PG    MCHC 31.2 30.0 - 36.5 g/dL    RDW 09.6 04.5 - 40.9 %    Platelets 266 150 - 400 K/uL    MPV 11.9 8.9 - 12.9 FL    Nucleated RBCs 0.0 0 PER 100 WBC    nRBC 0.00 0.00 - 0.01 K/uL    Neutrophils % 61.6 32.0 - 75.0 %    Lymphocytes % 32.6 12.0 - 49.0 %    Monocytes % 5.0 5.0 - 13.0 %    Eosinophils % 0.2 0.0 - 7.0 %    Basophils % 0.3 0.0 - 1.0 %    Immature Granulocytes % 0.3 0.0 - 0.5 %    Neutrophils Absolute 3.79 1.80 - 8.00 K/UL    Lymphocytes  Absolute 2.01 0.80 - 3.50 K/UL    Monocytes Absolute 0.31 0.00 - 1.00 K/UL    Eosinophils Absolute 0.01 0.00 - 0.40 K/UL    Basophils Absolute 0.02 0.00 - 0.10 K/UL    Immature Granulocytes Absolute 0.02 0.00 - 0.04 K/UL    Differential Type AUTOMATED     Comprehensive Metabolic Panel    Collection Time: 05/21/23 10:26 PM   Result Value Ref Range    Sodium 137 136 - 145 mmol/L    Potassium 4.3 3.5 - 5.1 mmol/L    Chloride 104 97 - 108 mmol/L    CO2 29 21 - 32 mmol/L    Anion Gap 4 2 - 12 mmol/L    Glucose 102 (H) 65 - 100 mg/dL    BUN 10 6 - 20 MG/DL    Creatinine 8.11 9.14 - 1.30 MG/DL    BUN/Creatinine Ratio 11 (L) 12 - 20      Est, Glom Filt Rate >90 >60 ml/min/1.56m2    Calcium 9.2 8.5 - 10.1 MG/DL    Total Bilirubin 0.7 0.2 - 1.0 MG/DL    ALT 19 12 - 78 U/L    AST 16 15 - 37 U/L    Alk Phosphatase 80 45 - 117 U/L    Total Protein 7.5 6.4 - 8.2 g/dL    Albumin 4.0 3.5 - 5.0 g/dL    Globulin 3.5 2.0 - 4.0 g/dL    Albumin/Globulin  Ratio 1.1 1.1 - 2.2     Troponin    Collection Time: 05/21/23 10:26 PM   Result Value Ref Range    Troponin, High Sensitivity <4 0 - 76 ng/L        Radiographic Results:  Non-plain film images such as CT, Ultrasound and MRI are read by the radiologist. Plain radiographic images are visualized and preliminarily interpreted by the ED Provider with the findings documented in the MDM section.     No orders to display         ED COURSE         Shared Decision Making and Disposition Considerations   I have discussed with the patient and/or caregiver my initial clinical impression which is based on an evidence-based clinical evaluation of the patient and interpretation of available results. Involved patient and/or caregiver in management, treatment options and final disposition. Patient/caregiver verbalize understanding of diagnosis and their agreement with disposition and treatment plan. We agree that at this time additional imaging or blood work is not needed.       Patient was given the  following medications:  Medications - No data to display    ED Orders Placed:  Orders Placed This Encounter   Procedures    CBC with Auto Differential    Comprehensive Metabolic Panel    Troponin    EKG 12 Lead         RISK STRATIFICATION / SCREENINGS   Unless otherwise documented, all screenings conducted, scored and interpreted by me. Birder Robson, MD  Heart Score: 1  Risk of MACE: low        Scores 0-3 Low risk: 0.9-1.7% risk of adverse cardiac event. In the HEART Score study, these patients were discharged.  Scores 4-6 Moderate risk: 12-16.6% risk of adverse cardiac event. In the HEART Score study, these patients were admitted to the hospital.   Scores >=7 High risk: 50-65% risk of adverse cardiac event. In the HEART Score study, these patients were candidates for early invasive measures.    A MACE (Major Adverse Cardiac Event) was defined as all-cause mortality, myocardial infarction, or coronary revascularization.      CLINICAL MANAGEMENT TOOLS   MEDICAL DECISION MAKING:   I considered, but did not perform, additional testing such CT Angiogram, as well as admission or transfer to a higher level of care.     I utilized an evidence-based risk rating tool (CMT) along with my training and experience to weigh the risk of discharge against the risks of further testing, imaging, or hospitalization. At this time, I estimate the risks of additional testing, imaging, or hospitalization to be equal to or greater than the risk of discharge(in the case of discharge home).      The patient's HEART Score is 1. In rare cases, I give patients with HEART Score of 4 the option of discharge, but only when they meet criteria for "Low 4," meaning that HST was used, and the 4 is not from a highly suspicious story, highly suspicious EKG, or positive cardiac enzymes.  In these selected cases, the risk of a "Low 4" is still most likely lower than the risk of admission and further testing/imaging. [id number]    SHARED DECISION  MAKING:   I discussed my risk assessment with the patient. The patient understands and consents to the risk of disposition/plan, as well as the risk of uncertainty in estimating outcomes. [id number]  COMPLEXITY       Applicable points are documented under the following sections:    Amount and Complexity of Data Reviewed  Independent interpretation of EKG, rhythm strip, X-ray & ultrasound documented under mdm and ed course.  Order & review of studies not primarily interpreted by me (such as lab trends and prior procedures or imaging results) documented under medical records review.  Tests considered but not performed and reasoning - documented under assessment and plan  Impact of diagnostic results on patient management - documented under mdm/ed course  Consults: Discussion and recommendations documented under ed course.   Independent Historian Clinical information obtained from an independent historians documented under HPI and assessment..   Review of patient's external medical record - Relevant content summarized under medical record review  Risk  Social determinant of Health Significantly Affecting Care documented under the SDH / Counseling.  Consideration of admission/observation for discharged patients and escalation of care for admitted patients - documented under mdm, assessment, ed course and/or reassessment sections.  This includes shared decision making between myself, the patient, family members and/or caregivers employed.   Administration of high risk medications and subsequent patient monitoring for side effects. Any complications listed under ed course.   Medications considered but not administered or prescribed - listed under ed course and final impression.      FINAL IMPRESSION     1. Acute chest pain           DISPOSITION/FOLLOW UP         DISPOSITION: DISCHARGE  The patient's results have been reviewed with patient and available family and/or caregiver. They verbally convey  their understanding and agreement of the patient's signs, symptoms, diagnosis, treatment and prognosis and additionally agree to follow up as recommended in the discharge instructions or to return to the Emergency Department should the patient's condition change prior to their follow-up appointment.   The patient and available family and/or caregiver verbally agree with the care plan and all of their questions have been answered. The discharge instructions have also been provided to the them with educational information regarding the patient's diagnosis as well a list of reasons why the patient would want to return to the ER prior to their follow-up appointment should any concerns arise, the patient's condition change or symptoms worsen.    Lenord Carbo, MD, Msc    PLAN:     Medication List        ASK your doctor about these medications      acetaminophen 325 MG tablet  Commonly known as: TYLENOL     Benzocaine-Clove Oil 20 % Gel     divalproex 500 MG DR tablet  Commonly known as: DEPAKOTE     levETIRAcetam 500 MG tablet  Commonly known as: KEPPRA     omeprazole 40 MG delayed release capsule  Commonly known as: PRILOSEC  Take 1 capsule by mouth every morning (before breakfast)            2.   [facility] Emergency Department  [address]  [place] [address]  [phone]  Go to   As needed, If symptoms worsen    primary care doctor    Schedule an appointment as soon as possible for a visit   As needed    3.   Return to ED if worse     I am the Primary Clinician of Record.   Birder Robson, MD (electronically signed)    (Please note that parts of this  dictation were completed with voice recognition software. Quite often unanticipated grammatical, syntax, homophones, and other interpretive errors are inadvertently transcribed by the computer software. Please disregards these errors. Please excuse any errors that have escaped final proofreading.)           Birder Robson, MD  05/27/23 1615        Birder Robson, MD  06/02/23 [phone]

## 2023-05-22 ENCOUNTER — Inpatient Hospital Stay
Admit: 2023-05-22 | Discharge: 2023-05-22 | Disposition: A | Discharge status: Home / Self Care | Payer: PRIVATE HEALTH INSURANCE | Attending: Emergency Medicine

## 2023-05-22 LAB — CBC WITH AUTO DIFFERENTIAL
Basophils %: 0.3 % (ref 0.0–1.0)
Basophils Absolute: 0.02 10*3/uL (ref 0.00–0.10)
Eosinophils %: 0.2 % (ref 0.0–7.0)
Eosinophils Absolute: 0.01 10*3/uL (ref 0.00–0.40)
Hematocrit: 36.2 % — ABNORMAL LOW (ref 36.6–50.3)
Hemoglobin: 11.3 g/dL — ABNORMAL LOW (ref 12.1–17.0)
Immature Granulocytes %: 0.3 % (ref 0.0–0.5)
Immature Granulocytes Absolute: 0.02 10*3/uL (ref 0.00–0.04)
Lymphocytes %: 32.6 % (ref 12.0–49.0)
Lymphocytes Absolute: 2.01 10*3/uL (ref 0.80–3.50)
MCH: 23.8 pg — ABNORMAL LOW (ref 26.0–34.0)
MCHC: 31.2 g/dL (ref 30.0–36.5)
MCV: 76.2 FL — ABNORMAL LOW (ref 80.0–99.0)
MPV: 11.9 FL (ref 8.9–12.9)
Monocytes %: 5 % (ref 5.0–13.0)
Monocytes Absolute: 0.31 10*3/uL (ref 0.00–1.00)
Neutrophils %: 61.6 % (ref 32.0–75.0)
Neutrophils Absolute: 3.79 10*3/uL (ref 1.80–8.00)
Nucleated RBCs: 0 /100{WBCs}
Platelets: 266 10*3/uL (ref 150–400)
RBC: 4.75 M/uL (ref 4.10–5.70)
RDW: 14.4 % (ref 11.5–14.5)
WBC: 6.2 10*3/uL (ref 4.1–11.1)
nRBC: 0 10*3/uL (ref 0.00–0.01)

## 2023-05-22 LAB — COMPREHENSIVE METABOLIC PANEL
ALT: 19 U/L (ref 12–78)
AST: 16 U/L (ref 15–37)
Albumin/Globulin Ratio: 1.1 (ref 1.1–2.2)
Albumin: 4 g/dL (ref 3.5–5.0)
Alk Phosphatase: 80 U/L (ref 45–117)
Anion Gap: 4 mmol/L (ref 2–12)
BUN/Creatinine Ratio: 11 — ABNORMAL LOW (ref 12–20)
BUN: 10 mg/dL (ref 6–20)
CO2: 29 mmol/L (ref 21–32)
Calcium: 9.2 mg/dL (ref 8.5–10.1)
Chloride: 104 mmol/L (ref 97–108)
Creatinine: 0.93 mg/dL (ref 0.70–1.30)
Est, Glom Filt Rate: >90 mL/min/{1.73_m2} (ref >60)
Globulin: 3.5 g/dL (ref 2.0–4.0)
Glucose: 102 mg/dL — ABNORMAL HIGH (ref 65–100)
Potassium: 4.3 mmol/L (ref 3.5–5.1)
Sodium: 137 mmol/L (ref 136–145)
Total Bilirubin: 0.7 mg/dL (ref 0.2–1.0)
Total Protein: 7.5 g/dL (ref 6.4–8.2)

## 2023-05-22 LAB — TROPONIN: Troponin, High Sensitivity: <4 ng/L (ref 0–76)

## 2023-05-22 MED ORDER — IBUPROFEN 400 MG PO TABS
400 | ORAL | Status: DC
Start: 2023-05-22 — End: 2023-05-21

## 2023-05-22 MED ORDER — BUTALBITAL-APAP-CAFFEINE 50-325-40 MG PO TABS
50-325-40 | ORAL | Status: DC
Start: 2023-05-22 — End: 2023-05-21

## 2023-05-23 LAB — EKG 12-LEAD
Atrial Rate: 70 {beats}/min
P Axis: 51 degrees
P-R Interval: 144 ms
Q-T Interval: 400 ms
QRS Duration: 90 ms
QTc Calculation (Bazett): 432 ms
R Axis: 40 degrees
T Axis: 52 degrees
Ventricular Rate: 70 {beats}/min
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
## 2024-02-15 NOTE — Progress Notes (Signed)
 "Neurology Consultation    Connor Prince,Connor Prince  MRN: [id number]  DOB: [birth year]    Service: Emergency Department    Referring Attending: Rolland Sloop,*  Consulting Attending: Dr. Ora   Consulting Provider: Carmon Pottier, DO    Assessment/Plan:   Headache. Connor Prince is a 38 y.o. male with medical history of epilepsy on Keppra 1.5 g BID who presents for headache. Neurology is consulted to correlate a radiographic finding clinically.    Patient presented with a L frontotemporal headache yesterday that has decreased to a 4/10 with migraine cocktail. He had non-contrast head CT that showed changes suggestive of sulcal effacement, raising concern for cerebral edema, but gray white junction is maintained and there is no effacement of basal cisterns. Neurological exam is largely reassuring but there are subtle abnormal signs of unknown chronicity. Work-up for metabolic causes of cerebral edema such as hepatic encephalopathy is warranted. 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 These findings could explain why patient has epilepsy, though do not require any further acute work up. Can be discussed with outpatient neurologist.     Recommendations:  - Request follow-up with Dr. Ev[facility] epilepsy clinic   - No further neurologic work up at this time.     Case was discussed with the attending physician, Dr. Ora.   Thank you for this consult. Please page [phone] with questions.     I am making this documentation available to the requesting physician for review.    Tinnie Buttery, DO  Child Neurology PGY-3    History of Present Illness:  Connor Prince is a 38 y.o. male with medical history of epilepsy on Keppra 1.5 g BID who presents for headache. Neurology is consulted to correlate a radiographic finding clinically.    Patient presented with a L frontotemporal headache yesterday that was 10/10 in  intensity and throbbing w/out any Neurological symptoms such as photophobia, phonophobia, nausea, vomiting, weakness, vision changes, gait imbalance etc. Patient had non-contrast head CT that showed some concern for diffuse cerebral edema.     I personally went to radiology room to review imaging and they note this is a soft call with no concern for impending herniation. When speaking with the patient s/p tylenol, dexamethasone, ibuprofen , toradol , magnesium, kcl, compazine he notes headache is 4/10. When query his seizure history he notes he gets GTC's monthly but never has seizures out of sleep or wakes up excessively tired (prior EEG has noted subclinical seizure). He reports compliance with Keppra 1.5 g BID. Notes last seizure was last month     Prior Neurologic History:   Epilepsy-used to follow up with Dr. Anderson    Review of Systems:  All other systems reviewed and negative except as noted in HPI.     Past Medical History:   Medical History[1]    Past Surgical History:   Surgical History[2]    Family History:   Family History[3]    Social History:  Social History     Tobacco Use    Smoking status: Former     Types: Cigars, Cigarettes    Smokeless tobacco: Not on file   Substance Use Topics    Alcohol use: Yes     Alcohol/week: 1.0 standard drink of alcohol     Types: 1 Standard drinks or equivalent per week     Comment: occasionally       Home/Current Medications:   Prescriptions  Prior to Admission[4]      Allergies:   Allergies[5]    EXAM:  Visit Vitals  BP 128/74   Pulse 76   Temp 36.5 C (97.7 F) (Oral)   Resp 18       General Exam  GEN: Well-appearing. No distress.   HEENT: NC/AT. No scleral icterus or conjunctival injection. No oropharyngeal lesions.  No neck stiffness.   CV: Normal rate and regular rhythm.   PULM: Normal effort on room air. No respiratory distress, no increased work of breathing.  MSK/EXT: No edema.   SKIN: Warm and dry. No visible rashes or other lesions. Multiple hypopigmented spots  noted on lower legs and back.   PSYCH: Cooperative and interactive. Behavior normal.     Neurological Exam  Mental Status:   Oriented to person, place, time, and situation.   Attention intact.   No neglect.   Calculations intact.     Speech/Language:  Dysarthria is absent.   Fluency of speech is normal.   Naming is normal.   Comprehension is intact.   Repetition is normal.     Cranial Nerves:   I: Not tested/deferred.  II: Visual fields full to finger counting.   Pupils are symmetric and reactive to light bilaterally.   III/IV/VI: Gaze is conjugate. Eye movements are intact without nystagmus.   V: Facial sensation is intact to light touch bilaterally.   VII: Face is symmetric at rest with intact activation bilaterally.   VIII: Hearing intact to finger rub.  IX/X: Palate elevates symmetrically. Uvula is midline.  XI: Shoulder shrug and head turn are strong.   XII: Tongue is midline.    Motor:  Normal tone.   Finger tapping is intact bilaterally.   No abnormal movements.    Confrontational Testing:  Action Right Left Comment   Shoulder abduction 5 5    Elbow flexion 5 5    Elbow extension 5 5    Wrist flexion 5 5    Wrist extension 5 5    Finger flexion/Grip 5 5    Finger extension 5 5    Finger abduction 5 5    Hip flexion 5 5    Knee flexion 5 5    Knee extension 5 5    Ankle dorsiflexion 5 5    Ankle plantarflexion 5 5      Reflexes:   Right Left Comment   Biceps 2+ 2+    Triceps 2+ 2+    Brachioradialis 2+ 2+    Patella 2+ 2+    Achilles 2+ 2+    Ankle clonus 5 beats  5 beats      Sensory:  Intact to light touch.     Coordination:  Finger-nose-finger intact bilaterally.     Gait:  Normal casual gait.     Attending exam (02/14/24):   Mildly drowsy. Opens eyes to voice. Can maintain attention when asked, but does close eyes from time to time, attributed by patient to drowsiness from being awake during the prior evening. Fluent. Follows 3 step commands. Repetition intact. Bisects midline. Visual field testing is  grossly intact with inconsistent occasional mistakes. Pupils 4 --> 2 mm bilaterally. EOM with gaze-evoked horizontal nystagmus. No vertical nystagmus. Face symmetric. Tongue midline. No pronator drift. Finger taps symmetric. Tone was mildly increased in the legs. Full strength to confrontation in the legs bilaterally. Reflex symmetric. 4 to 5 beats of clonus at the ankles bilaterally. Finger nose finger intact. Gait was narrow-based and steady  without aid. Tandem was difficult.     Pertinent Results:   Admission on 02/14/2024   Component Date Value    Vent Rate 02/13/2024 83     Atrial Rate 02/13/2024 83     PR Interval 02/13/2024 120     QRS Duration 02/13/2024 92     QT/QTc 02/13/2024 374     QTc Calculation 02/13/2024 439     P-Axis 02/13/2024 69     R-Axis 02/13/2024 54     T-Axis 02/13/2024 64     WBC 02/13/2024 9.3     RBC 02/13/2024 5.34     HGB 02/13/2024 12.8 (L)     HCT 02/13/2024 39.7     MCV 02/13/2024 74.3 (L)     MCH 02/13/2024 24.0 (L)     MCHC 02/13/2024 32.2     RDW 02/13/2024 13.0     Platelets 02/13/2024 268     MPV 02/13/2024 11.0     % NRBC 02/13/2024 0.0     % Neu 02/13/2024 74.0     % Lym 02/13/2024 19.8     % Mono 02/13/2024 5.9     % Eos 02/13/2024 0.1     % Baso 02/13/2024 0.2     Neutrophils Absolute 02/13/2024 6.9 (H)     LYM 02/13/2024 1.8     MONO 02/13/2024 0.6     EOS 02/13/2024 <0.1     BASO 02/13/2024 <0.1     Morph/Comment 02/13/2024 Slight polychromasia.,Moderate ovalocytes.,Slight RBC fragments.Occasional Large Platelets present.,Results confirmed by smear review.     Sodium 02/13/2024 136     Potassium 02/13/2024 3.3 (L)     Potassium Comment 02/13/2024 Not Hemolyzed     Chloride 02/13/2024 103     Carbon Dioxide 02/13/2024 27     Anion Gap 02/13/2024 6     Glucose 02/13/2024 113 (H)     BUN 02/13/2024 8     Creatinine 02/13/2024 1.00     GFRcr (Estimated) 02/13/2024 101     Calcium 02/13/2024 9.0     Troponin I 02/13/2024 <0.03      Troponin I 02/14/2024 <0.03     Troponin I 02/14/2024 <0.03     Influenza A 02/14/2024 Not Detected     Influenza B 02/14/2024 Not Detected     CoVID-19, NAA 02/14/2024 Not Detected     Respiratory Syncytial Vi* 02/14/2024 Not Detected     Ammonia 02/14/2024 43 (H)     AST 02/14/2024 12     ALT 02/14/2024 7     Alk Phos 02/14/2024 97     Bilirubin, Total 02/14/2024 0.6     Bilirubin, Conjugated 02/14/2024 0.2     Protein, Total 02/14/2024 7.3     Albumin 02/14/2024 4.8     Globulin 02/14/2024 2.5     Acetaminophen 02/14/2024 <3.0     Barbiturate Screen 02/14/2024 Negative     Benzodiazepine Screen 02/14/2024 Negative     Cocaine Screen 02/14/2024 Negative     Opiate Screen 02/14/2024 Negative     Salicylate  02/14/2024 <5     Ethanol Level 02/14/2024 <10     WBC 02/15/2024 10.3 (H)     RBC 02/15/2024 5.09     HGB 02/15/2024 12.2 (L)     HCT 02/15/2024 37.7 (L)     MCV 02/15/2024 74.1 (L)     MCH 02/15/2024 24.0 (L)     MCHC 02/15/2024 32.4     RDW 02/15/2024 13.2     Platelets 02/15/2024  254     MPV 02/15/2024 11.7     % NRBC 02/15/2024 0.0     % Neu 02/15/2024 84.1     % Lym 02/15/2024 12.7     % Mono 02/15/2024 3.1     % Eos 02/15/2024 0.0     % Baso 02/15/2024 0.1     Neutrophils Absolute 02/15/2024 8.6 (H)     LYM 02/15/2024 1.3     MONO 02/15/2024 0.3     EOS 02/15/2024 <0.1     BASO 02/15/2024 <0.1     Morph/Comment 02/15/2024 Slight anisocytosis.,Moderate ovalocytes.,Slight polychromasia.,Slight RBC fragments.     Sodium 02/15/2024 138     Potassium 02/15/2024 4.8     Potassium Comment 02/15/2024 Not Hemolyzed     Chloride 02/15/2024 108     Carbon Dioxide 02/15/2024 26     Anion Gap 02/15/2024 4     Glucose 02/15/2024 105 (H)     BUN 02/15/2024 12     Creatinine 02/15/2024 0.92     GFRcr (Estimated) 02/15/2024 111     AST 02/15/2024 11     ALT 02/15/2024 <6     Alk Phos 02/15/2024 88     Bilirubin, Total 02/15/2024 0.5     Protein, Total 02/15/2024  6.4     Albumin 02/15/2024 4.2     Globulin 02/15/2024 2.2     Calcium 02/15/2024 8.7 (L)     Lipase 02/15/2024 49     Magnesium 02/15/2024 2.1      Imaging:  Non-contrast head CT 02/14/24:  IMPRESSION:  Generalized slight decrease in the volume of intracranial fluid spaces as noted above, raising concern for an element of diffuse cerebral edema, correlate clinically.    MRI w/wo contrast 02/14/24:  IMPRESSION:  1.   There are several small bilateral subependymal nodules, foci of increased   T2 weighted signal predominately involving subcortical white matter and small   enhancing foci near the bilateral foramen of Monro. Question history of   tuberous sclerosis.   2.   Negative for acute ischemic infarct.           [1]     Diagnosis Date    Nephrolithiasis     Seizures (CMS/HCC)    [2]  Past Surgical History:  Procedure Laterality Date    CT CHEST ANGIO W AND WO IV CONTRAST  08/22/2017    CT CHEST ANGIO W AND WO IV CONTRAST CONVERSION CERNER    NO PAST SURGERIES     [3]  Family History  Problem Relation Name Age of Onset    Stomach cancer Mother     [4]  Medications Prior to Admission   Medication Sig Dispense Refill Last Dose/Taking    levETIRAcetam (Keppra) 500 MG tablet Take 3 tablets by mouth 2 times daily. 180 tablet 5 02/13/2024 Morning    chlorhexidine (Peridex) 0.12 % solution Swish and spit 15 mL 2 times daily. Swish for 30 seconds. Do not swallow. 473 mL 0 Unknown    divalproex ER (Depakote ER) 500 MG 24 hr tablet Take 4 tablets by mouth at bedtime. 120 tablet 5     divalproex ER (Depakote ER) 500 MG 24 hr tablet take one tablet by mouth daily at bedtime 30 tablet 0     fluticasone (Flonase Allergy Relief) 50 MCG/ACT nasal spray Administer 1 spray into each nostril daily as needed for allergies 16 g 0 Unknown    loratadine (Claritin) 10 MG tablet Take 1 tablet by mouth  daily as needed for allergies. 30 tablet 0 Unknown    ondansetron (Zofran) 4 MG tablet Take 1 tablet by mouth every 8  hours as needed for nausea 20 tablet 0 Unknown    pantoprazole (Protonix) 40 MG EC tablet Take 1 tablet by mouth daily 30 tablet 0 Unknown   [5]  No Known Allergies  "

## 2024-02-15 NOTE — Nursing Note (Signed)
"  Discharge instructions given with referral for follow up care. IV removed. Patient verbalizes understanding. Patient wheeled to ER waiting room in no apparent distress. Patient waiting for medicaid ride home.    "

## 2024-02-15 NOTE — Discharge Summary (Signed)
 "  Clinical Decision Unit Discharge Summary     Admitting Provider: Lorette Hind, MD  Discharge Provider: Rolland Sloop,*  Primary Care Physician at Discharge: Connor Climes, MD  Admission Date: 02/14/2024                                           Discharge Date: 02/15/2024 11:11 AM    Reason for Hospitalization     1. Tension headache        Primary Visit Diagnosis     Headache    Outpatient Follow-Up and Patient Discharge Instructions     No future appointments.      Medications Prescribed at Discharge        Your medication list        CONTINUE taking these medications        Instructions Last Dose Given Next Dose Due   chlorhexidine 0.12 % solution  Commonly known as: Peridex      Swish and spit 15 mL 2 times daily. Swish for 30 seconds. Do not swallow.       fluticasone 50 MCG/ACT nasal spray  Commonly known as: Flonase Allergy Relief      Administer 1 spray into each nostril daily as needed for allergies       levETIRAcetam 500 MG tablet  Commonly known as: Keppra      Take 3 tablets by mouth 2 times daily.       loratadine 10 MG tablet  Commonly known as: Claritin      Take 1 tablet by mouth daily as needed for allergies.       ondansetron 4 MG tablet  Commonly known as: Zofran      Take 1 tablet by mouth every 8 hours as needed for nausea       pantoprazole 40 MG EC tablet  Commonly known as: Protonix      Take 1 tablet by mouth daily                Hospital Course     Mr. Connor Prince is a 38 yr old with a history of seizure disorder who presented to the ED for evaluation of left sided throbbing headache associated with photophobia for the last three days. Patient also reported intermittent non exertional chest pain. No fever, chills, shortness of breath. On arrival to the ED, VSS. Workup notable for ECG NSR with LVH, WBC 9.3, Hgb 12.8, sCr 1, K 3.3, troponin negative x 2, CXR without acute findings, CT head generalized slight decrease in the volume of intracranial fluid. Neurology was  consulted and recommended obtaining MRI brain. Patient was treated with migraine cocktail and referred for observation admission.   While in the observation unit, patient remained hemodynamically stable. His symptoms were managed with PRN migraine cocktail. He underwent MRI of his brain that showed several small bilateral subependymal nodules- noted that similar findings were seen on MRI in 2019. No acute findings. He was continued on his home keppra. Neurology evaluated patient and had no additional recommendations other than encouraged follow up in seizure clinic with Dr. Anderson. Regarding chest pain, ECG demonstrating NSR, troponin negative x 2 , CXR without acute findings. PERC negative. Heart score 1. No recurrence of pain. Given workup as above patient appropriate for outpatient evaluation. Patient comfortable with plan for discharge and close outpatient follow up. Patient discharged.  CDU Course Updates     02/14/2024  8:33 PM  Vitals:    02/14/24 2015   BP: 124/79   Pulse: 91   Resp: 12   Temp: 36.9 C (98.4 F)   SpO2: 100%       PT has returned from MRI, result pending.  States his HA is now a 7/10.  Still left sided, throbbing. Denies associated vision changes, N/V, isolated weakness, paresthesias.  Exam:  CN's II-XII intact.  Speech logical and coherent.  Strength intact.  Sensation intact.  Coordination intact.   Will order 1L NS, ketorolac  and compazine.   BarbaraStout PA-C #[id number]    02/14/2024  9:50 PM  MRI head resulted as follows:  IMPRESSION:  1.   There are several small bilateral subependymal nodules, foci of increased   T2 weighted signal predominately involving subcortical white matter and small   enhancing foci near the bilateral foramen of Monro. Question history of   tuberous sclerosis.     CHAT sent to the Neurology team to notify them.  BarbaraStout PA-C #[id number]    02/15/2024  8:53 AM  Vitals:    02/15/24 0734   BP: (!) 123/92   Pulse: 68   Resp: 15   Temp: 36.5 C (97.7 F)   SpO2: 100%      Patient seen on morning rounds with attending.  Reports headache currently manageable however he does express concern stating his headaches are a new development.  Neurology is following along.  Plan for disposition upon neurology recommendations, patient will be given follow-up with Dr. [facility].  Connor Prince, [place]    11:08 AM 02/15/2024 -   Patient seen by neurology. No new recommendations other than follow up with Dr. Anderson. Patient feels that headache is improved to the point where he can manage at home. Heart score 1. He was encouraged to follow up with his PCP for additional workup. Patient encouraged to continue keppra and reviewed seizure precautions including driving regulations. Patient comfortable with plan for discharge. Strict return precautions given. At time of DC, patient hemodynamically stable, tolerating oral intake and at functional baseline. Patient discharged.   Hillary Wiles-Lafayette DO         Medications Given Throughout Hospital Stay     Medications   levETIRAcetam (Keppra) tablet 1,500 mg (1,500 mg Oral Given 02/15/24 0839)   sodium chloride flush 0.9 % 1-10 mL (has no administration in time range)   acetaminophen (Tylenol) tablet 975 mg (975 mg Oral Given 02/13/24 2116)   ibuprofen  tablet 400 mg (400 mg Oral Given 02/14/24 0145)   potassium chloride CR (Klor-Con) ER tablet 40 mEq (40 mEq Oral Given 02/14/24 0941)   sodium chloride 0.9% bolus 1,000 mL (0 mL Intravenous Stopped 02/14/24 1445)   ketoROLAC  (Toradol ) injection 15 mg (15 mg Intravenous Given 02/14/24 0939)   prochlorperazine (Compazine) 5 mg/mL injection 5 mg (5 mg Intravenous Given 02/14/24 0939)   magnesium sulfate 2,000 mg in water 50 mL IVPB (premix) (0 mg Intravenous Stopped 02/14/24 1445)   dexamethasone (Decadron) 4 mg/mL INJ for oral use 10 mg (10 mg Oral Given 02/14/24 0942)   gadobenate dimeglumine (Multihance) injection 9 mmol (9 mmol Intravenous Given 02/14/24 1915)   ketoROLAC  (Toradol ) injection 15 mg  (15 mg Intravenous Given 02/14/24 2052)   sodium chloride 0.9% bolus 1,000 mL (0 mL Intravenous Stopped 02/14/24 2152)   prochlorperazine (Compazine) 5 mg/mL injection 5 mg (5 mg Intravenous Given 02/14/24 2052)  Lab Results     Recent Results (from the past 12 hours)   CBC Auto Differential    Collection Time: 02/15/24  3:43 AM   Result Value Ref Range    WBC 10.3 (H) 3.7 - 9.7 10e9/L    RBC 5.09 4.54 - 5.78 10e12/L    HGB 12.2 (L) 13.3 - 17.2 g/dL    HCT 62.2 (L) 61.0 - 50.9 %    MCV 74.1 (L) 81.2 - 94.0 fL    MCH 24.0 (L) 25.7 - 32.2 pg    MCHC 32.4 30.9 - 35.5 g/dL    RDW 86.7 88.4 - 85.8 %    Platelets 254 179 - 373 10*3/uL    MPV 11.7 8.7 - 12.1 fL    % NRBC 0.0 0.0 - 0.2 %    % Neu 84.1 %    % Lym 12.7 %    % Mono 3.1 %    % Eos 0.0 %    % Baso 0.1 %    Neutrophils Absolute 8.6 (H) 2.0 - 6.7 10e9/L    LYM 1.3 1.1 - 3.3 10e9/L    MONO 0.3 0.2 - 0.7 10e9/L    EOS <0.1 0.0 - 0.4 10e9/L    BASO <0.1 0.0 - 0.1 10e9/L    Morph/Comment       Slight anisocytosis.,Moderate ovalocytes.,Slight polychromasia.,Slight RBC fragments.   Comprehensive Metabolic Panel    Collection Time: 02/15/24  3:43 AM   Result Value Ref Range    Sodium 138 135 - 145 mmol/L    Potassium 4.8 3.6 - 5.1 mmol/L    Potassium Comment Not Hemolyzed Not Hemolyzed    Chloride 108 100 - 110 mmol/L    Carbon Dioxide 26 21 - 33 mmol/L    Anion Gap 4 0 - 12 mmol/L    Glucose 105 (H) 65 - 100 mg/dL    BUN 12 8 - 23 mg/dL    Creatinine 9.07 9.39 - 1.20 mg/dL    GFRcr (Estimated) 888 >=60 mL/min/1.42m2    AST 11 0 - 50 U/L    ALT <6 0 - 60 U/L    Alk Phos 88 40 - 120 U/L    Bilirubin, Total 0.5 0.0 - 1.3 mg/dL    Protein, Total 6.4 6.4 - 8.5 g/dL    Albumin 4.2 3.7 - 5.2 g/dL    Globulin 2.2 1.3 - 3.5 g/dL    Calcium 8.7 (L) 8.9 - 10.7 mg/dL   Lipase    Collection Time: 02/15/24  3:43 AM   Result Value Ref Range    Lipase 49 8 - 78 U/L   Magnesium    Collection Time: 02/15/24  3:43 AM   Result Value Ref Range    Magnesium 2.1 1.8 - 2.8 mg/dL        Diagnostic Results     MR head w and wo IV contrast   Final Result   1.   There are several small bilateral subependymal nodules, foci of increased    T2 weighted signal predominately involving subcortical white matter and small    enhancing foci near the bilateral foramen of Monro. Question history of    tuberous sclerosis.    2.   Negative for acute ischemic infarct.       THIS DOCUMENT HAS BEEN ELECTRONICALLY VERIFIED BY SHERWOOD CRAMP MD ON 02/14/2024 20:49:53      XR chest 2 views   Final Result      No acute cardiopulmonary  process.      Dictated By: Randy Aviado 02/14/2024 9:28 AM      I have reviewed the images and dictated, reviewed or edited the final report.      Dictated By: Darina Hosteller    Electronically Verified by: Elna Lenis 02/14/2024 9:39 AM            CT head wo IV contrast   Final Result   Generalized slight decrease in the volume of intracranial fluid spaces as noted above, raising concern for an element of diffuse cerebral edema, correlate clinically.      Dictated By: Ellene Barnacle 02/14/2024 9:31 AM      I have reviewed the images and dictated, reviewed or edited the final report.      Dictated By: Ellene Barnacle    Electronically Verified by: Butler DELENA Rigg 02/14/2024 9:55 AM                Discharge Physical Exam     Physical Exam     Visit Vitals  BP (!) 123/92 (BP Location: Left arm, Patient Position: Lying)   Pulse 68   Temp 36.5 C (97.7 F) (Oral)   Resp 15     GEN: Well appearing, in no acute distress middle aged man lying in bed in no distress   HENT: No scleral icterus. No conjunctival injection.  Moist mucous membranes. No stridor. No pharyngeal exudates. Uvula midline. Neck supple.   CV: + S1/S2. Regular. Warm extremities.   Respiratory: Lungs clear to ausculation bilaterally. No wheezes or crackles. Non labored respirations.   ABD: Soft, non-tender, non-distended. No rebound, guarding or rigidity.   Extremities: No lower extremity edema.   Neuro: Alert and conversant.  Normal speech. No facial droop. Moves all extremities against gravity.    SKIN: Exposed skin without lesions or rashes   Psych: Cooperative, appropriate       Condition of Patient at Discharge     Improved    Discharge Disposition     Home    Time Spent Coordinating Discharge     20 minutes    Information given to patient/family/representative     Information regarding appts, diagnoses, post-hospital care instructions and education materials is provided to the patient/family/rep in writing on the After Visit Summary at the time of Discharge. A copy for the 'Provider' (PCP or Referring) is also given.    To reach providers in the [facility]     Call Telepage at [phone] and page the discharge service or call [phone]. [facility] provider numbers can also be accessed via the web at: [email or web address]  "

## 2024-02-17 ENCOUNTER — Inpatient Hospital Stay
Admit: 2024-02-17 | Discharge: 2024-02-17 | Disposition: A | Discharge status: Home / Self Care | Payer: MEDICARE | Arrived: AM | Attending: Student in an Organized Health Care Education/Training Program

## 2024-02-17 ENCOUNTER — Emergency Department: Admit: 2024-02-17 | Payer: MEDICARE

## 2024-02-17 LAB — BASIC METABOLIC PANEL
Anion Gap: 10 mmol/L (ref 2–14)
BUN/Creatinine Ratio: 9 — ABNORMAL LOW (ref 12–20)
BUN: 8 mg/dL (ref 6–20)
CO2: 26 mmol/L (ref 20–29)
Calcium: 9.5 mg/dL (ref 8.6–10.0)
Chloride: 103 mmol/L (ref 98–107)
Creatinine: 0.93 mg/dL (ref 0.70–1.20)
Est, Glom Filt Rate: >90 ml/min/1.73m2 (ref >59)
Glucose: 99 mg/dL (ref 65–100)
Potassium: 4.3 mmol/L (ref 3.5–5.1)
Sodium: 140 mmol/L (ref 136–145)

## 2024-02-17 LAB — CBC WITH AUTO DIFFERENTIAL
Basophils %: 0.2 % (ref 0.0–1.0)
Basophils Absolute: 0.03 K/UL (ref 0.00–0.10)
Eosinophils %: 0 % (ref 0.0–7.0)
Eosinophils Absolute: 0 K/UL (ref 0.00–0.40)
Hematocrit: 35.8 % — ABNORMAL LOW (ref 36.6–50.3)
Hemoglobin: 11.3 g/dL — ABNORMAL LOW (ref 12.1–17.0)
Immature Granulocytes %: 0.4 % (ref 0.0–0.5)
Immature Granulocytes Absolute: 0.06 K/UL — ABNORMAL HIGH (ref 0.00–0.04)
Lymphocytes %: 13.4 % (ref 12.0–49.0)
Lymphocytes Absolute: 1.79 K/UL (ref 0.80–3.50)
MCH: 23.4 pg — ABNORMAL LOW (ref 26.0–34.0)
MCHC: 31.6 g/dL (ref 30.0–36.5)
MCV: 74.1 FL — ABNORMAL LOW (ref 80.0–99.0)
MPV: 11.4 FL (ref 8.9–12.9)
Monocytes %: 6.5 % (ref 5.0–13.0)
Monocytes Absolute: 0.87 K/UL (ref 0.00–1.00)
Neutrophils %: 79.5 % — ABNORMAL HIGH (ref 32.0–75.0)
Neutrophils Absolute: 10.63 K/UL — ABNORMAL HIGH (ref 1.80–8.00)
Nucleated RBCs: 0 /100{WBCs}
Platelets: 263 K/uL (ref 150–400)
RBC: 4.83 M/uL (ref 4.10–5.70)
RDW: 13.2 % (ref 11.5–14.5)
WBC: 13.4 K/uL — ABNORMAL HIGH (ref 4.1–11.1)
nRBC: 0 K/uL (ref 0.00–0.01)

## 2024-02-17 LAB — HEPATIC FUNCTION PANEL
ALT: 10 U/L (ref 10–50)
Albumin/Globulin Ratio: 1.5 (ref 1.1–2.2)
Albumin: 4.1 g/dL (ref 3.5–5.2)
Alk Phosphatase: 91 U/L (ref 40–129)
Bilirubin, Direct: <0.1 mg/dL — ABNORMAL LOW (ref 0.1–0.3)
Globulin: 2.6 g/dL (ref 2.0–4.0)
Total Bilirubin: 0.7 mg/dL (ref 0.0–1.2)
Total Protein: 6.7 g/dL (ref 6.4–8.3)

## 2024-02-17 LAB — EXTRA TUBES HOLD

## 2024-02-17 MED ORDER — IOPAMIDOL 76 % IV SOLN
76 | Freq: Once | INTRAVENOUS | Status: AC | PRN
Start: 2024-02-17 — End: 2024-02-17
  Administered 2024-02-17: 13:00:00 100 mL via INTRAVENOUS

## 2024-02-17 MED ORDER — KETOROLAC TROMETHAMINE 30 MG/ML IJ SOLN
30 | INTRAMUSCULAR | Status: AC
Start: 2024-02-17 — End: 2024-02-17
  Administered 2024-02-17: 17:00:00 15 mg via INTRAVENOUS

## 2024-02-17 MED FILL — KETOROLAC TROMETHAMINE 30 MG/ML IJ SOLN: 30 mg/mL | INTRAMUSCULAR | Qty: 1 | Fill #0

## 2024-02-17 MED FILL — ISOVUE-370 76 % IV SOLN: 76 % | INTRAVENOUS | Qty: 100 | Fill #0
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
## 2024-02-17 NOTE — Care Coordination (Signed)
"  CM consulted regarding the need for follow up care. Patient in need of a PCP and to follow up for outpatient care regarding medical  needs. Case discussed w/ Dr. Roseann uncertain of health literacy and ability to navigate . Noted 10 ED visits in 12 months. Met w/ patient at discharge introduced to role. Confirmed demographics informed mobile left at home [phone]. Permission obtained to reach friend/housemate  (12 yrs) [name] Waddy [phone] regarding OP follow up.     Transport home scheduled roundtrip.   "

## 2024-02-17 NOTE — ED Notes (Signed)
 Pt to CT at this time.

## 2024-02-17 NOTE — ED Notes (Signed)
"  Wet slippers removed and hospital socks applied.   "

## 2024-02-17 NOTE — Telephone Encounter (Signed)
"  Call placed to patient introduce self and  verified HIPAA identifier. Asked if patient had ED d/c paperwork stated yes. Asked Connor Prince to provide to [name] and tomorrow contact [facility] for test results and to set up needed medical appointments . Patient states to above plan. Confirmed mobile number [phone].   "

## 2024-02-17 NOTE — Telephone Encounter (Signed)
"  Call placed to friend/ housemate [name] Waddy [phone] introduced to role and informed patient gave permission to contact. Noted friend was awaken by phone call states had taken a nap. Informed patient needs help setting up follow up needed appointments for PCP and Specialists. [place] states he will help. This clinical research associate asked him to locate discharge paperwork and contact [facility] number for test results. Friend acknowledged plan.   "
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
## 2024-02-17 NOTE — Telephone Encounter (Signed)
"  CM placed call to [facility] [phone] spoke w/ Switchboard however after hours requesting to call back during business hours. Alternative call placed to Case Management/ Coordination [phone] VM left requesting Coordination needed for patient to Navigate obtaining PCP and the need for OP follow up (outcome pending).   "

## 2024-02-21 NOTE — Telephone Encounter (Signed)
"  Call placed to [facility] Coordination [phone] attempted on call informed department is opened today. VM X 2 to Department informing of patient having 3 ED visits ([facility]) this week and the need for coordination for PCP and OP Specialist f/u for lesions (outcome pending).     HIPAA compliant VM left for [name] Waddy/Friend requesting call back.   "

## 2024-02-21 NOTE — Telephone Encounter (Signed)
"  Call received from Care Coordination/ Sherryl Daring, [place]  [phone] informed  calls had been placed to patient and VM left. This clinical research associate confirmed numbers mobile and home number [facility]). This clinical research associate expressed patient having challenge not following and navigating the medical processes. Informed team is trying to schedule patient appointment w/   Dr. Kenichiro Ono. If patient contacts this writer back requested call be placed to [facility] [phone].     CM attempted [facility] VM requesting call back.   "

## 2024-02-25 ENCOUNTER — Inpatient Hospital Stay
Admit: 2024-02-25 | Discharge: 2024-02-26 | Disposition: A | Discharge status: Home / Self Care | Payer: MEDICARE | Arrived: AM
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
## 2024-02-25 NOTE — ED Notes (Signed)
"  Patient provided frozen meal at this time.   "

## 2024-02-25 NOTE — ED Notes (Signed)
"  Report received from  Tinnie, RN. They advised of the patient's chief complaint, current status, orders completed (to include IV access/medications/radiology testing), outstanding orders that still need to be completed, and the treatment plan. Questions asked and answered prior to assumption of care.   "

## 2024-02-25 NOTE — ED Notes (Signed)
Patient resting in stretcher at this time.

## 2024-02-25 NOTE — Procedures (Signed)
"  Procedure  ECG 12 lead    Performed by: [name], MD  Authorized by: [name], MD    ECG interpreted by ED Physician in the absence of a cardiologist: yes    Interpretation:     Interpretation: normal    Rate:     ECG rate:  79    ECG rate assessment: normal    Rhythm:     Rhythm: sinus rhythm    Ectopy:     Ectopy: none    QRS:     QRS axis:  Normal  ST segments:     ST segments:  Normal  T waves:     T waves: normal    Q waves:     Abnormal Q-waves: not present    Other findings:     Other findings: LVH                 [name], MD  02/25/24 0025    "

## 2024-02-25 NOTE — Progress Notes (Signed)
"                                     Discharge Transportation    Connor Prince is a 38 y.o. who presented for siezures.  Patient is ready for discharge and transportation request received from Nurse.  Plan is for patient to return to Home.  Patient to be transported by Ambulatory Medicaid transport.    Patient's Insurer:    [employer]        [phone])    []  [employer] (both plans)        [phone])   []  [employer]         [phone])    []  [employer]        [phone])   []  [employer]   (formerly [employer])       [phone])    []  Medicare [employer]&B or Commercial Medicare (stretcher only)   []  [employer]        [phone])    []  [employer] (patient or hospital pay)   [x]  Medicaid of [place]            [phone])  []  Other:                                             Destination address:   [address]    [place] [address]    Destination type: [x]  Private Residence       []  Nursing Facility         []  Other:      Phone # given to call:   [phone]     Type of transport requested:   [x]  Ambulatory         []  Wheelchair         []  Stretcher     Authorization #:   [id number]       Kandyce Hummer, LCSW  Emergency Department  Clinical Social Worker  Pager [phone]          "
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
## 2024-02-25 NOTE — Consults (Signed)
 "-------------------------------------------------------------------------------  Attestation signed by Juliene Kerns, MD at 02/25/2024  4:08 PM  Attending Attestation: I have reviewed and agree with the resident's documentation.  However, I did NOT have a face-to-face encounter with the patient on the date of this service.  -------------------------------------------------------------------------------    Consults    Neurology Consultation    Shimon,Spyros  MRN: [id number]  DOB: December 22, 1985    Service: ED  Referring Attending: [name], MD    History of Present Illness:  SRIHITH AQUILINO is 38 y.o. male with medical history of epilepsy on Keppra  1.5 g BID who presents for breakthrough seizure. Per roommate, 8 GTC 2 min seizures with return to baseline in between. On home keppra  1.5 g bid. Compliant with keppra . Pt feels back to baseline at this time. Denies any recent illness. 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 Workup deferred to outpatient. Pt has hx of epilepsy with seizure per month. Last seizure one month ago. 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 These findings could explain why patient has epilepsy, though do not require any further acute work up. Can be discussed with outpatient neurologist.     Prior Neurologic History:   Hx of epilepsy as reported in HPI    Review of Systems:  All other systems reviewed and negative except as noted in HPI.     Past Medical History:   Medical History[1]    Past Surgical History:   Surgical History[2]    Family History:   Family History[3]    Social History:  Social History     Tobacco Use    Smoking status: Former     Types: Cigars, Cigarettes    Smokeless tobacco: Not on file   Substance Use Topics    Alcohol use: Yes     Alcohol/week: 1.0 standard drink of alcohol     Types: 1 Standard drinks or equivalent per week     Comment: occasionally        Relevant Home/Current Medications:   Keppra  1.5 g bid      Allergies:   Allergies[4]    EXAM:  Visit Vitals  BP 131/84 (Patient Position: Sitting)   Pulse 82   Temp 36.9 C (98.5 F) (Oral)   Resp 17       General Exam  GEN: Well-appearing. No distress.   HEENT: NC/AT. No scleral icterus or conjunctival injection. No oropharyngeal lesions.  No neck stiffness.   CV: Normal rate and regular rhythm. No carotid bruits.   PULM: Normal effort on room air. No respiratory distress, no increased work of breathing. Clear to auscultation bilaterally.  MSK/EXT: No edema.   SKIN: Warm and dry. No visible rashes or other lesions.   PSYCH: Cooperative and interactive. Behavior normal.     Neurological Exam  Mental Status:   Alert and oriented to person, place, time, and situation.   Attention intact.   No neglect.   Calculations intact.     Speech/Language:  Dysarthria is absent.   Fluency of speech is normal.   Naming is normal.   Comprehension is intact.   Repetition is normal.     Cranial Nerves:   I: Not tested/deferred.  II: Visual fields full to finger counting.   Pupils are symmetric and reactive to light bilaterally.   III/IV/VI: Gaze is conjugate. Eye movements are intact without nystagmus.   V: Facial sensation is intact  to light touch bilaterally.   VII: Face is symmetric at rest with intact activation bilaterally.   VIII: Hearing intact to finger rub.  IX/X: Palate elevates symmetrically. Uvula is midline.  XI: Shoulder shrug and head turn are strong.   XII: Tongue is midline.    Motor:  Normal tone.   No pronator drift. No orbiting.   Finger tapping is intact bilaterally.   No abnormal movements.    Confrontational Testing:  Action Right Left Comment   Shoulder abduction 5 5    Elbow flexion 5 5    Elbow extension 5 5    Wrist flexion 5 5    Wrist extension 5 5    Finger flexion/Grip 5 5    Finger extension 5 5    Finger abduction 5 5    Hip flexion 5 5    Knee flexion 5 5    Knee extension 5 5    Ankle  dorsiflexion 5 5    Ankle plantarflexion 5 5      Reflexes:   Right Left Comment   Biceps 2+ 2+    Triceps 2+ 2+    Brachioradialis 2+ 2+    Patella 3+ 3+    Achilles 2+ 2+    Plantar Response Flexor Flexor    Ankle clonus 3 beats 3 beats      Sensory:  Intact to light touch.   Romberg negative.    Coordination:  Finger-nose-finger intact bilaterally.   Heel-to-shin intact bilaterally.     Gait:  Narrowed gait              Pertinent Results:   Admission on 02/24/2024   Component Date Value    Sodium 02/24/2024 137     Potassium 02/24/2024 4.0     Potassium Comment 02/24/2024 Not Hemolyzed     Chloride 02/24/2024 105     Carbon Dioxide 02/24/2024 27     Anion Gap 02/24/2024 5     Glucose 02/24/2024 91     BUN 02/24/2024 13     Creatinine 02/24/2024 1.02     GFRcr (Estimated) 02/24/2024 98     AST 02/24/2024 17     ALT 02/24/2024 12     Alk Phos 02/24/2024 79     Bilirubin, Total 02/24/2024 0.6     Protein, Total 02/24/2024 6.9     Albumin 02/24/2024 4.2     Globulin 02/24/2024 2.7     Calcium 02/24/2024 8.8 (L)     Magnesium 02/24/2024 1.9     WBC 02/24/2024 8.5     RBC 02/24/2024 5.22     HGB 02/24/2024 11.9 (L)     HCT 02/24/2024 38.7 (L)     MCV 02/24/2024 74.1 (L)     MCH 02/24/2024 22.8 (L)     MCHC 02/24/2024 30.7 (L)     RDW 02/24/2024 13.3     Platelets 02/24/2024 290     MPV 02/24/2024 11.4     % NRBC 02/24/2024 0.0     % Neu 02/24/2024 68.1     % Lym 02/24/2024 26.7     % Mono 02/24/2024 4.9     % Eos 02/24/2024 0.2     % Baso 02/24/2024 0.1     Neutrophils Absolute 02/24/2024 5.8     LYM 02/24/2024 2.3     MONO 02/24/2024 0.4     EOS 02/24/2024 <0.1     BASO 02/24/2024 <0.1     Troponin I 02/24/2024 <0.03  Results for orders placed or performed during the hospital encounter of 02/14/24   MR head w and wo IV contrast    Narrative    PROCEDURE INFORMATION:   Exam: MR Head Without and With Contrast   Exam date and time: 02/14/2024 6:34 PM   Age: 38 years old    Clinical indication: Pain; Other: Labile(? ) AMS, seizure disorder. Mildly   elevated ammonia (43) level.     TECHNIQUE:   Imaging protocol: Magnetic resonance imaging of the head without and with   contrast.   Contrast material: MULTIHANCE; Contrast volume: 19 ml; Contrast route:   INTRAVENOUS (IV);    Other protocol: Outside imaging study.     COMPARISON:   CT HEAD WO CONTRAST 02/14/2024 9:15 AM     FINDINGS:    Major vascular flow voids at the skull base are preserved. No extra-axial   fluid collection. No hydrocephalus.    No diffusion restriction. There are small foci of increased signal involving   predominantly subcortical white matter. No associated diffusion restriction or   enhancement. No evidence of cerebral edema. No pathologic susceptibility. There   are a few small bilateral mildly T2 hyperintense subependymal nodules.   Enhancing nodule of the left foramen of Monro measures up to 3 mm enhancing   nodule near the right foramen of Monro measures up to 2 mm. No other pathologic   intracranial enhancement.    Minimal paranasal sinus disease. No significant mastoid effusion.       Impression    1.   There are several small bilateral subependymal nodules, foci of increased   T2 weighted signal predominately involving subcortical white matter and small   enhancing foci near the bilateral foramen of Monro. Question history of   tuberous sclerosis.   2.   Negative for acute ischemic infarct.     THIS DOCUMENT HAS BEEN ELECTRONICALLY VERIFIED BY SHERWOOD CRAMP MD ON 02/14/2024 20:49:53   CT head wo IV contrast    Narrative    STUDY: CT OF THE HEAD WITHOUT IV CONTRAST     HISTORY: headache; rule out acute pathology     TECHNIQUE: CT imaging was performed through the head without IV contrast. Axial, sagittal, and coronal images are provided.     COMPARISON: CT head without contrast 07/04/2022    FINDINGS:   There is no abnormal density in the brain parenchyma and gray-white differentiation is maintained. There is no  focal mass-effect or midline shift. There is no acute intracranial hemorrhage.     Compared to the prior examination, the ventricles, cisternal spaces including the suprasellar cistern, sylvian fissures, and cortical sulci are all slightly smaller, though not obliterated. An element of diffuse edema cannot be excluded  . There is no focal mass effect, midline shift, or downward herniation.    The included orbits are unremarkable. The paranasal sinuses and mastoid air cells are clear. No fracture is demonstrated.       Impression    Generalized slight decrease in the volume of intracranial fluid spaces as noted above, raising concern for an element of diffuse cerebral edema, correlate clinically.    Dictated By: Ellene Barnacle 02/14/2024 9:31 AM    I have reviewed the images and dictated, reviewed or edited the final report.    Dictated By: Ellene Barnacle   Electronically Verified by: Butler DELENA Rigg 02/14/2024 9:55 AM       Results for orders placed or performed during the hospital encounter of 04/16/22  XR lumbar spine 2 or 3 views    Narrative    PROCEDURE INFORMATION:   Exam: XR Lumbosacral Spine   Exam date and time: 04/16/2022 6:24 PM   Age: 38 years old   Clinical indication: Other: Right sided lower back pain after fall     TECHNIQUE:   Imaging protocol: Radiologic exam of the lumbosacral spine.   Views: 2 or 3 views.     COMPARISON:   CT: Renal colic helical 3/72/7980 2:39 AM     FINDINGS:   Bones/joints: There is no evidence of acute fracture.There is no evidence of   malalignment or dislocation. Intervertebral disc spaces are maintained.   Soft tissues: Unremarkable.       Impression    1.   There is no evidence of acute fracture.There is no evidence of   malalignment or dislocation.   2.   Intervertebral disc spaces are maintained.     THIS DOCUMENT HAS BEEN ELECTRONICALLY VERIFIED BY DELPHIA CLARKE MD ON 04/16/2022 18:50:15   Results for orders placed or performed in visit on 10/08/19   EEG     Narrative    * Final Report *      THIS DOCUMENT HAS BEEN MOVED BY HIM TO ITS APPROPIATE PLACE WITHIN THE MEDICAL RECORD. The original dates and signatures have been placed within the body of the note.              Result Type: Neuro Electroencephalogram  Date: October 09, 2019 15:32 EDT  Status: Auth (Verified)  Subject: Home video LTEM  Author: ONO DO, KENICHIRO on October 09, 2019 15:37 EDT  Electronically Signed By: ONO DO, KENICHIRO on October 09, 2019 15:37 EDT  Encounter info: [id number], [facility], OP, 08/11/2019 - 08/11/2019    * Final Report *    Home video LTEM     Patient:   JEREMI, LOSITO            MRN: [id number]            FIN: [id number]               Age:   7 years     Sex:  M     DOB:  September 04, 1985   Associated Diagnoses:   None   Author:   ANDERSON DO, KENICHIRO      LONG-TERM EEG RECORDING REPORT    PATIENT NAME:  Maury Groninger  DATE OF BIRTH:  11-18-1985  ORDERING PROVIDER:  Tye Anderson, DO  DX CODE(s):  R56.9, G40.309  EXAM DURATION: 45 Hours and 13 Minutes  12-Jul -EEG with Video 12-26 hours intermittent monitoring  13-Jul -EEG with Video 12-26 hours intermittent monitoring  CLINICAL HISTORY:  39 year old male with pmhx of possible ADHD, asthma, convulsions/jerks since age 63 described as intermittent jerking lasting 10-15 minutes with postictal headache and questionable chest pain. Patient c/o seizure 06/2019 when he had a witnessed fall outside with whole body shaking. EEG for consideration of epileptiform activity.  MEDICATION(s):  Keppra , divalproex ER, Ferrous Sulfate  LONG-TERM EEG/VEEG RECORDING SET-UP and TAKE-DOWN TECHNICAL SUMMARY:  Twenty-five (25) disposable electrodes were applied according to the standard 10-20 international measurement and placement protocol in person by an EEG Technologist for the purposes of recording long-term video EEG; (19) cephalic, (2) T1/T2 sub-temporal, (1) ground, (1) system reference, and (2) ECG.  Data was recorded on a 24-channel Lifelines EEG  recording device with a sampling rate of 200 samples per second/per channel, at impedance levels  less than 10 K Ohms.  Once the exam was completed, the recording was halted, electrodes carefully removed, and data transferred.  SET-UP TECH:  Mliss Colonel  RECORDING SET-UP DATE:  09/07/2019, 10:55 AM  RECORDING TAKE-DOWN DATE:  09/09/2019, 8:08 AM  INTERMITTENT MONITORING with VIDEO TECHNICAL SUMMARY  Long-Term EEG with Video was monitored intermittently by a qualified EEG technologist for the entirety of the recording; quality check-ins were performed at a minimum of every two hours, checking and documenting real-time data and video to assure the integrity and quality of the recording (e.g., camera position, electrode integrity and impedance), and identify the need for maintenance.  For intermittent monitoring, an EEG Technologist monitored no more than 12 patients concurrently.  Diagnostic video was captured at least 80% of the time during the recording.  PRUNING TECHNICAL SUMMARY:    At the end of the recording, the EEG Technologist generates a technical description, which is the EEG Technologists written documentation of the reviewed video-EEG data, including technical interventions and these elements: reviewing raw EEG/VEEG data and events and automated detection as well as patient pushbutton event activations; and annotating, editing and archiving EEG/VEEG data for review by the physician or other qualified healthcare professional.  For review, the Video EEG recording can be visualized in all standard types of montages, 16 channels and greater, and playbacks include digital high frequency filters previously noted.  The Video EEG has been notated with patient typical symptom events at the direction of the patient by depressing a push button mounted on a waist worn Lifelines EEG recording device.  Digital spike and seizure detection software was used to identify potential abnormalities in the EEG, and alerts  were reviewed and annotated by the technologist in the   [employer] EEG Review software.  Video EEG and report are notated with events that were determined to be of significance by the digital analysis software showing spike and seizure detections.  AWAKE EEG:  Moderately organized and sustained background of 8 Hz during waking and resting recording.  Attenuation is noted with eye opening.  INTERICTAL AWAKE:  No interictal activity was observed.  ICTAL AWAKE:  No ictal activity was observed.  SLEEP STAGES:  N1 Sleep (Stage 1) was observed and characterized by the disappearance of alpha rhythm and the appearance of vertex activity.  N2 Sleep (Stage 2) was observed and characterized by vertex waves, K-complexes, and sleep spindles.   N3 (Stage 3) sleep was observed and characterized by high amplitude Delta activity of 20%.   REM sleep was observed.  INTERICTAL SLEEP:  Interictal activity was observed and described as possible frequent triphasic morphology (triphasic waves).  ICTAL SLEEP:  No ictal activity was observed.  SPIKE AND SEIZURE ANALYSIS AND REVIEW:  146 spike and seizure detection software alerts have been reviewed by the EEG technologist.  124 spike alerts were reviewed and analyzed by the EEG technologist; and none of these alerts appear to have clinical significance.  12 seizure alerts were reviewed and analyzed by the technologist; however, none of the alerts appear to have clinical significance. Possible two generalized rhythmical 2-2.5 Hz slowing.    PUSH BUTTON EVENTS:  A patient diary was maintained; the patient did not press the button, nor did they describe any typical symptoms.    EKG:  No significant rate or rhythm changes are noted.      Date:  09/10/2019      Long-Term EEG Interpretation:   At the onset of this recording, the patient is awake. During  wakefulness, the posterior dominant rhythm consists of near continuous runs of 10-15uV 8Hz  symmetric alpha activity, that attenuates symmetrically  with eye opening. Additional low voltage beta activity occurs symmetrically at the anterior head regions bilaterally. There are no asymmetries in amplitude or frequency between hemispheres.   During drowsiness and sleep, there is attenuation of alpha rhythm and low voltage theta activity occurs bilaterally.  K Complexes, vertex sharp waves, and sleep spindles are additionally seen, and are symmetric.   Intermittent photic stimulation and hyperventilation were not performed.  EKG lead showed a normal sinus rhythm.  INTERICTAL ABNORMALITIES:  Intermittent right anterolateral frontotemporal spike-waves are seen. Maximal negativity ranging from 80-120uV is seen involving Fp2>F8/F4, with lower amplitude field involvement at T4/T6 as seen on Cz reference. Positive polarity projection is seen involving the left temporal chain. These waveforms can occasionally appear as broader-based right anterolateral frontotemporal temporal sharp waves involving Fp2>F8>>T4 as seen on AVG reference.  ICTAL FINDINGS:  No clinical events of concern were reported, however, periods of rhythmic suspect ictal patterns were captured.   1) On 09/08/2019 5:06am, a prolonged series of 2Hz  right anterolateral frontotemporal spikes are seen. This is followed by pseudo-normalization of the background. Then there is emergence of notched rhythmic waveforms particularly involving the LEFT greater than RIGHT temporal chain (Fp1/F7>Fp2/F8). This rhythmicity spontaneously extinguishes with ultra-slow 0.3Hz  periodic spike-waves better seen involving Fp1/F3>Fp2. On video, the patient is seen sleeping, covered by blankets, however, no obvious large amplitude motor activity is seen without any additional rhythmic myogenic artifact.  2) On 09/08/2019 6:22pm, another prolonged run of right anterolateral frontotemporal spikes at 2-2.5Hz  is seen. These discharges again have maximal negativity up to 120uV at Fp2>F8>F4 with suspect projection to the left frontal  region. No video is available for this event.  EEG FINDINGS:  1) Two suspected subclinical seizures which are poorly lateralized and localized, at best suspect RIGHT anterolateral frontotemporal involvement, with possibly LEFT frontotemporal recruitment.  2) Intermittent right anterolateral frontotemporal spike-waves.   CLINICAL INTERPRETATION:  This intermittently monitored video EEG of 45 hours and 13 minutes duration, performed during wakefulness and sleep is abnormal. Two suspicious poorly lateralized/localized seizures were captured, which may be RIGHT anterolateral frontotemporal onset with possibly left frontotemporal recruitment. These did not result in obvious motor seizure activity. Note that no clinical events of concern were reported by the patient. Interictal abnormalities suggestive of right anterolateral frontotemporal epileptogenic risk were additionally seen.            Assessment/Plan:   38 y.o. male with medical history of epilepsy on Keppra  1.5 g BID who presents for breakthrough seizure on home keppra  1.5 g bid. No recent illness. 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 TS could be etiology of patient's epilepsy. Further workup to be completed outpatient. Pt currently at baseline. Exam with intact strength and sensation, increased b/l patellar reflexes, ankle clonus and narrowed gait. Ankle clonus observed during previous consult. Iso breakthrough seizures, will add additional AED. Per previous neurology consult, epilepsy clinic has already messaged for soon FUV with Dr. Anderson.    Recommendations:  - no further inpt workup  - continue home keppra  1.5 g bid  - start vimpat  50 bid  - soon fuv with home epileptologist Dr. Anderson    Case was discussed with the attending physician, Dr. Ezzard.  Thank you for this consult. Please page [phone] with questions.     I am  making this documentation available to the  requesting physician for review.    Maude Sox, MD                [1]     Diagnosis Date    Nephrolithiasis     Seizures (CMS/HCC)    [2]  Past Surgical History:  Procedure Laterality Date    CT CHEST ANGIO W AND WO IV CONTRAST  08/22/2017    CT CHEST ANGIO W AND WO IV CONTRAST CONVERSION CERNER    NO PAST SURGERIES     [3]  Family History  Problem Relation Name Age of Onset    Stomach cancer Mother     [4]  No Known Allergies  "

## 2024-02-25 NOTE — ED Provider Notes (Signed)
 "[facility]  EMERGENCY DEPARTMENT ENCOUNTER       Pt Name: Connor Prince  MRN: [id number]  Birthdate Oct 30, 1985  Date of evaluation: 02/25/2024  Provider: Silvano DELENA Porta, MD   PCP: No primary care provider on file.  Note Started: 1:38 AM EST 02/27/24     CHIEF COMPLAINT       Chief Complaint   Patient presents with    Seizures     Patient BIBEMS from home cc x7 seizures yesterday and x7 seizures today per family, 2 additional witnessed seizures by EMS in which they administered 5mg  versed on the second one.BGL 86. Patient states he takes his medicine daily and hasn't missed any doses.         HISTORY OF PRESENT ILLNESS: 1 or more elements      History From: patient, friend, EMS report, History limited by: none     Ej Pinson is a 38 y.o. male with PMHx significant for epilepsy and remainder as below, who presents to the ED for evaluation of breakthrough seizure    Patient BIBEMS for increased seizures witnessed by roommate. EMS BG 86. EMS witnessed 2 seizures and gave 5 mg versed.    He has not had any new falls/traumas/injuries. The patient reports current headache but denies recent nausea, vomiting, fevers, abdominal or chest pain. The patient reports tolerating po. No one around them has been sick.   He denies missed AED dosing.    Was seen yesterday for the same and seen by neurology, who recommending adding vimpat  to his regimen which he has not yet picked up from the pharmacy.          Please See MDM for Further Details of the HPI/PMH  Nursing Notes were all reviewed and agreed with or any disagreements were addressed in the HPI.     REVIEW OF SYSTEMS        Positives and Pertinent negatives as per HPI.    PAST HISTORY     Past Medical History:  Past Medical History:   Diagnosis Date    Asthma     Chest pain     Cough     EP (epilepsy) (HCC)     Kidney stone     Migraine     Muscle pain     Seizures (HCC)     Skipped beats     SOB (shortness of breath)     Vitamin D  deficiency 08/14/2016       Past Surgical History:  No past surgical history on file.    Family History:  Family History   Problem Relation Age of Onset    Seizures Mother     No Known Problems Father     Migraines Mother     Cancer Mother         stomach       Social History:  Social History     Tobacco Use    Smoking status: Former     Current packs/day: 0.25     Types: Cigarettes    Smokeless tobacco: Never    Tobacco comments:     Quit smoking: approx 2-3   Substance Use Topics    Alcohol use: Yes     Alcohol/week: 2.0 - 6.0 standard drinks of alcohol     Types: 2 - 6 Standard drinks or equivalent per week    Drug use: No       Allergies:  No Known Allergies  CURRENT MEDICATIONS      Discharge Medication List as of 02/26/2024 12:15 AM        CONTINUE these medications which have NOT CHANGED    Details   omeprazole  (PRILOSEC) 40 MG delayed release capsule Take 1 capsule by mouth every morning (before breakfast), Disp-30 capsule, R-0Print      acetaminophen  (TYLENOL ) 325 MG tablet Take 975 mg by mouth every 6 hours as neededHistorical Med      Benzocaine-Clove Oil 20 % GEL Apply 1 Dose topically 4 times daily as neededHistorical Med      divalproex (DEPAKOTE) 500 MG DR tablet Take 2,000 mg by mouth dailyHistorical Med      levETIRAcetam  (KEPPRA ) 500 MG tablet Take 500 mg by mouth 2 times dailyHistorical Med             SCREENINGS               No data recorded      NIH Stroke Scale  NIH Stroke Scale Assessed: No     PHYSICAL EXAM      ED Triage Vitals   Encounter Vitals Group      BP 02/25/24 1915 132/79      Girls Systolic BP Percentile --       Girls Diastolic BP Percentile --       Boys Systolic BP Percentile --       Boys Diastolic BP Percentile --       Pulse 02/25/24 1920 91      Respirations 02/25/24 1920 16      Temp 02/25/24 1920 98.1 F (36.7 C)      Temp Source 02/25/24 1920 Oral      SpO2 02/25/24 1920 100 %      Weight - Scale 02/25/24 1920 78.6 kg (173 lb 4.5 oz)      Height 02/25/24 1920 1.981 m  (6' 6)      Head Circumference --       Peak Flow --       Pain Score --       Pain Loc --       Pain Education --       Exclude from Growth Chart --         Physical Exam  Constitutional:       General: He is not in acute distress.     Appearance: Normal appearance. He is not ill-appearing.   HENT:      Head: Normocephalic and atraumatic.      Comments: No tongue laceration  Eyes:      Conjunctiva/sclera: Conjunctivae normal.   Cardiovascular:      Rate and Rhythm: Normal rate.   Pulmonary:      Effort: Pulmonary effort is normal. No respiratory distress.   Musculoskeletal:         General: No swelling or deformity.      Cervical back: Neck supple.   Skin:     General: Skin is warm and dry.   Neurological:      General: No focal deficit present.      Mental Status: He is alert and oriented to person, place, and time.      GCS: GCS eye subscore is 4. GCS verbal subscore is 5. GCS motor subscore is 6.      Cranial Nerves: Cranial nerves 2-12 are intact.      Sensory: Sensation is intact.      Motor: Motor function is intact.  Coordination: Coordination is intact.   Psychiatric:         Mood and Affect: Mood normal.         Behavior: Behavior normal.          DIAGNOSTIC RESULTS   LABS:     Labs Reviewed   CBC WITH AUTO DIFFERENTIAL - Abnormal; Notable for the following components:       Result Value    MCV 74.2 (*)     MCH 23.6 (*)     Neutrophils % 79.2 (*)     Monocytes % 2.8 (*)     All other components within normal limits   LEVETIRACETAM  LEVEL - Abnormal; Notable for the following components:    Levetiracetam  71.6 (*)     All other components within normal limits   COMPREHENSIVE METABOLIC PANEL - Abnormal; Notable for the following components:    CO2 30 (*)     ALT 9 (*)     All other components within normal limits   COMPREHENSIVE METABOLIC PANEL   MAGNESIUM         EKG: If performed, independent interpretation documented below in the MDM section  All EKGs independently interpreted by  me.    RADIOLOGY:  Non-plain film images such as CT, Ultrasound and MRI are read by the radiologist. Plain radiographic images are visualized and preliminarily interpreted by the ED Provider with the findings documented in the MDM section.     Interpretation per the Radiologist below, if available at the time of this note:     No orders to display        PROCEDURES   Unless otherwise noted below, none      EMERGENCY DEPARTMENT COURSE and DIFFERENTIAL DIAGNOSIS/MDM   Vitals:    Vitals:    02/25/24 2310 02/25/24 2325 02/25/24 2340 02/26/24 0000   BP: 139/74 134/80 134/66 133/74   Pulse: 77 80 75    Resp: 18 16 14     Temp:       TempSrc:       SpO2: 100% 99% 92% 100%   Weight:       Height:            Patient was given the following medications:  Medications   lacosamide  (VIMPAT ) tablet 50 mg (50 mg Oral Given 02/25/24 2236)   acetaminophen  (TYLENOL ) tablet 1,000 mg (1,000 mg Oral Given 02/25/24 2236)   ketorolac  (TORADOL ) injection 30 mg (30 mg IntraVENous Given 02/25/24 2235)       Medical Decision Making  This patient presents with symptoms concerning for seizure like activity. I considered, but think less likely, drug / toxin etiologies (ETOH, stimulants, medication side effects), metabolic disturbances (glucose, Na), malignant arrhythmia/syncope, acute CNS infections (meningitis, encephalitis, abscess), ICH / tumor / CVA. Presentation not consistent with impact seizure related to head trauma. Patient with history of epilepsy.  Questionable compliance with his medications.  Will send off Keppra  level for outpatient neurologist to follow-up on.  Per chart review, patient just seen for seizures yesterday and was actually seen by neurology consult.  They recommended patient start Vimpat  which patient has not yet picked up yet.  Will give one-time dose here as well as Keppra .  Plan for outpatient neurology follow-up.      Amount and/or Complexity of Data Reviewed  Labs: ordered.    Risk  OTC drugs.  Prescription drug  management.          CLINICAL MANAGEMENT TOOLS:  ED Course as of 02/28/24 1738   Tue Feb 25, 2024   2156 Patient seized per nursing, described as approximately 10 second episode with left arm raised above head and full body convulsions; patient w/o significant VS changes, and was able to answer questions immediately afterward; no indication for AED at this time [HF]   2222 Per neurology consult from day prior:  Recommendations:  - no further inpt workup  - continue home keppra  1.5 g bid  - start vimpat  50 bid  - soon fuv with home epileptologist Dr. Anderson   [HF]      ED Course User Index  [HF] Melinda Silvano LABOR, MD         FINAL IMPRESSION     1. Breakthrough seizure [facility])          DISPOSITION/PLAN   Juanjose Hellmann's  results have been reviewed with him.  He has been counseled regarding his diagnosis, treatment, and plan.  He verbally conveys understanding and agreement of the signs, symptoms, diagnosis, treatment and prognosis and additionally agrees to follow up as discussed.  He also agrees with the care-plan and conveys that all of his questions have been answered.  I have also provided discharge instructions for him that include: educational information regarding their diagnosis and treatment, and list of reasons why they would want to return to the ED prior to their follow-up appointment, should his condition change.     CLINICAL IMPRESSION    Discharge Note: The patient is stable for discharge home. The signs, symptoms, diagnosis, and discharge instructions have been discussed, understanding conveyed, and agreed upon. The patient is to follow up as recommended or return to ER should their symptoms worsen.      PATIENT REFERRED TO:  [facility] Emergency Department  [address]  [place]  [address]  [phone]  Go to   If symptoms worsen    Schedule follow-up with your neurologist as soon as possible             DISCHARGE MEDICATIONS:     Medication List        ASK your doctor  about these medications      acetaminophen  325 MG tablet  Commonly known as: TYLENOL      Benzocaine-Clove Oil 20 % Gel     divalproex 500 MG DR tablet  Commonly known as: DEPAKOTE     levETIRAcetam  500 MG tablet  Commonly known as: KEPPRA      omeprazole  40 MG delayed release capsule  Commonly known as: PRILOSEC  Take 1 capsule by mouth every morning (before breakfast)                DISCONTINUED MEDICATIONS:  Discharge Medication List as of 02/26/2024 12:15 AM          I am the Primary Clinician of Record.   Silvano LABOR Melinda, MD (electronically signed)    (Please note that parts of this dictation were completed with voice recognition software. Quite often unanticipated grammatical, syntax, homophones, and other interpretive errors are inadvertently transcribed by the computer software. Please disregards these errors. Please excuse any errors that have escaped final proofreading.)         Melinda Silvano LABOR, MD  02/28/24 1739    "

## 2024-02-26 LAB — COMPREHENSIVE METABOLIC PANEL
ALT: 11 U/L (ref 10–50)
ALT: 9 U/L — ABNORMAL LOW (ref 10–50)
AST: 23 U/L (ref 10–50)
Albumin/Globulin Ratio: 1.4 (ref 1.1–2.2)
Albumin/Globulin Ratio: 1.4 (ref 1.1–2.2)
Albumin: 4.3 g/dL (ref 3.5–5.2)
Albumin: 3.9 g/dL (ref 3.5–5.2)
Alk Phosphatase: 87 U/L (ref 40–129)
Alk Phosphatase: 79 U/L (ref 40–129)
Anion Gap: 11 mmol/L (ref 2–14)
Anion Gap: 8 mmol/L (ref 2–14)
BUN/Creatinine Ratio: 12 (ref 12–20)
BUN/Creatinine Ratio: 12 (ref 12–20)
BUN: 13 mg/dL (ref 6–20)
BUN: 12 mg/dL (ref 6–20)
CO2: 27 mmol/L (ref 20–29)
CO2: 30 mmol/L — ABNORMAL HIGH (ref 20–29)
Calcium: 9.6 mg/dL (ref 8.6–10.0)
Calcium: 9.3 mg/dL (ref 8.6–10.0)
Chloride: 102 mmol/L (ref 98–107)
Chloride: 105 mmol/L (ref 98–107)
Creatinine: 1.11 mg/dL (ref 0.70–1.20)
Creatinine: 0.99 mg/dL (ref 0.70–1.20)
Est, Glom Filt Rate: 89 ml/min/1.73m2 (ref >59)
Est, Glom Filt Rate: >90 ml/min/1.73m2 (ref >59)
Globulin: 3 g/dL (ref 2.0–4.0)
Globulin: 2.8 g/dL (ref 2.0–4.0)
Glucose: 89 mg/dL (ref 65–100)
Glucose: 86 mg/dL (ref 65–100)
Potassium: 4.5 mmol/L (ref 3.5–5.1)
Sodium: 139 mmol/L (ref 136–145)
Sodium: 142 mmol/L (ref 136–145)
Total Bilirubin: 0.7 mg/dL (ref 0.0–1.2)
Total Bilirubin: 0.6 mg/dL (ref 0.0–1.2)
Total Protein: 7.3 g/dL (ref 6.4–8.3)
Total Protein: 6.7 g/dL (ref 6.4–8.3)

## 2024-02-26 LAB — CBC WITH AUTO DIFFERENTIAL
Basophils %: 0.2 % (ref 0.0–1.0)
Basophils Absolute: 0.02 K/UL (ref 0.00–0.10)
Eosinophils %: 0.1 % (ref 0.0–7.0)
Eosinophils Absolute: 0.01 K/UL (ref 0.00–0.40)
Hematocrit: 41.5 % (ref 36.6–50.3)
Hemoglobin: 13.2 g/dL (ref 12.1–17.0)
Immature Granulocytes %: 0.3 % (ref 0.0–0.5)
Immature Granulocytes Absolute: 0.03 K/UL (ref 0.00–0.04)
Lymphocytes %: 17.4 % (ref 12.0–49.0)
Lymphocytes Absolute: 1.51 K/UL (ref 0.80–3.50)
MCH: 23.6 pg — ABNORMAL LOW (ref 26.0–34.0)
MCHC: 31.8 g/dL (ref 30.0–36.5)
MCV: 74.2 FL — ABNORMAL LOW (ref 80.0–99.0)
MPV: 10.9 FL (ref 8.9–12.9)
Monocytes %: 2.8 % — ABNORMAL LOW (ref 5.0–13.0)
Monocytes Absolute: 0.24 K/UL (ref 0.00–1.00)
Neutrophils %: 79.2 % — ABNORMAL HIGH (ref 32.0–75.0)
Neutrophils Absolute: 6.87 K/UL (ref 1.80–8.00)
Nucleated RBCs: 0 /100{WBCs}
Platelets: 298 K/uL (ref 150–400)
RBC: 5.59 M/uL (ref 4.10–5.70)
RDW: 13.6 % (ref 11.5–14.5)
WBC: 8.7 K/uL (ref 4.1–11.1)
nRBC: 0 K/uL (ref 0.00–0.01)

## 2024-02-26 LAB — MAGNESIUM: Magnesium: 2.2 mg/dL (ref 1.6–2.6)

## 2024-02-26 MED ORDER — LACOSAMIDE 50 MG PO TABS
50 | Freq: Once | ORAL | Status: AC
Start: 2024-02-26 — End: 2024-02-25
  Administered 2024-02-26: 04:00:00 50 mg via ORAL

## 2024-02-26 MED ORDER — KETOROLAC TROMETHAMINE 30 MG/ML IJ SOLN
30 | INTRAMUSCULAR | Status: AC
Start: 2024-02-26 — End: 2024-02-25
  Administered 2024-02-26: 04:00:00 30 mg via INTRAVENOUS

## 2024-02-26 MED ORDER — ACETAMINOPHEN 500 MG PO TABS
500 | ORAL | Status: AC
Start: 2024-02-26 — End: 2024-02-25
  Administered 2024-02-26: 04:00:00 1000 mg via ORAL

## 2024-02-26 MED ORDER — LEVETIRACETAM 500 MG/5ML IV SOLN
500 | Freq: Two times a day (BID) | INTRAVENOUS | Status: DC
Start: 2024-02-26 — End: 2024-02-26
  Administered 2024-02-26: 04:00:00 1500 mg via INTRAVENOUS

## 2024-02-26 MED FILL — VIMPAT 50 MG PO TABS: 50 mg | ORAL | Qty: 1 | Fill #0

## 2024-02-26 MED FILL — KETOROLAC TROMETHAMINE 30 MG/ML IJ SOLN: 30 mg/mL | INTRAMUSCULAR | Qty: 1 | Fill #0

## 2024-02-26 MED FILL — TYLENOL EXTRA STRENGTH 500 MG PO TABS: 500 mg | ORAL | Qty: 2 | Fill #0

## 2024-02-26 MED FILL — LEVETIRACETAM 500 MG/5ML IV SOLN: 500 MG/5ML | INTRAVENOUS | Qty: 15 | Fill #0

## 2024-02-26 NOTE — Discharge Instructions (Signed)
"  You were seen in the emergency department for a possible seizure.     Please take precautions and now you are unable to drive due to the new diagnosis of seizures .  Seizure Instructions:  NO driving is permitted for 6 months from last seizure, per [place]  law  Avoid bathing alone (take showers only, no baths)  Avoid swimming alone  Avoid climbing on roofs or ladders  Avoid operating heavy machinery  Avoid cooking alone - caution with hot or sharp objects; use only with supervision     If a seizure happens, call your doctor.  If a seizure lasts for more than 5 minutes, if you are not back to normal in 5 minutes (back-to-back seizures without returning to normal), then call 911. This is an emergency.       If your symptoms worsen, please return to the emergency department.  Please pick up the Vimpat  antiseizure medication that was sent into your pharmacy.  You were given a dose of your Keppra  as well as Vimpat  tonight in the ER.    "

## 2024-02-28 LAB — LEVETIRACETAM LEVEL: Levetiracetam: 71.6 ug/mL — ABNORMAL HIGH (ref 10.0–40.0)
